# Patient Record
Sex: Male | Born: 1935
Health system: Southern US, Community
[De-identification: ages and names within clinical notes are randomized; demographics above are authoritative.]

## PROBLEM LIST (undated history)

## (undated) DIAGNOSIS — M4850XA Collapsed vertebra, not elsewhere classified, site unspecified, initial encounter for fracture: Secondary | ICD-10-CM

## (undated) DIAGNOSIS — E7800 Pure hypercholesterolemia, unspecified: Secondary | ICD-10-CM

## (undated) DIAGNOSIS — Z860100 Personal history of colon polyps, unspecified: Secondary | ICD-10-CM

## (undated) DIAGNOSIS — M1990 Unspecified osteoarthritis, unspecified site: Secondary | ICD-10-CM

## (undated) DIAGNOSIS — D126 Benign neoplasm of colon, unspecified: Secondary | ICD-10-CM

## (undated) DIAGNOSIS — K649 Unspecified hemorrhoids: Secondary | ICD-10-CM

## (undated) DIAGNOSIS — I10 Essential (primary) hypertension: Secondary | ICD-10-CM

## (undated) DIAGNOSIS — K2270 Barrett's esophagus without dysplasia: Secondary | ICD-10-CM

## (undated) DIAGNOSIS — E119 Type 2 diabetes mellitus without complications: Secondary | ICD-10-CM

## (undated) HISTORY — PX: NO PAST SURGERIES: SHX2092

## (undated) HISTORY — DX: Type 2 diabetes mellitus without complications: E11.9

## (undated) HISTORY — DX: Barrett's esophagus without dysplasia: K22.70

## (undated) HISTORY — DX: Unspecified osteoarthritis, unspecified site: M19.90

## (undated) HISTORY — DX: Essential (primary) hypertension: I10

## (undated) HISTORY — PX: OTHER SURGICAL HISTORY: SHX169

## (undated) HISTORY — DX: Personal history of colonic polyps: Z86.010

## (undated) HISTORY — DX: Collapsed vertebra, not elsewhere classified, site unspecified, initial encounter for fracture: M48.50XA

## (undated) HISTORY — DX: Benign neoplasm of colon, unspecified: D12.6

## (undated) HISTORY — PX: UPPER GASTROINTESTINAL ENDOSCOPY: SHX188

## (undated) HISTORY — DX: Personal history of colon polyps, unspecified: Z86.0100

## (undated) HISTORY — DX: Pure hypercholesterolemia, unspecified: E78.00

## (undated) HISTORY — DX: Unspecified hemorrhoids: K64.9

---
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
## 2008-02-27 ENCOUNTER — Ambulatory Visit: Payer: Self-pay | Admitting: Internal Medicine

## 2008-02-28 ENCOUNTER — Telehealth: Payer: Self-pay | Admitting: Internal Medicine

## 2008-03-12 ENCOUNTER — Ambulatory Visit: Payer: Self-pay | Admitting: Internal Medicine

## 2008-03-12 ENCOUNTER — Encounter: Payer: Self-pay | Admitting: Internal Medicine

## 2008-03-15 ENCOUNTER — Encounter: Payer: Self-pay | Admitting: Internal Medicine

## 2008-05-04 ENCOUNTER — Encounter: Admission: RE | Admit: 2008-05-04 | Discharge: 2008-05-04 | Payer: Self-pay | Admitting: Internal Medicine

## 2008-06-08 DIAGNOSIS — K648 Other hemorrhoids: Secondary | ICD-10-CM | POA: Insufficient documentation

## 2008-06-10 ENCOUNTER — Ambulatory Visit: Payer: Self-pay | Admitting: Internal Medicine

## 2008-06-10 DIAGNOSIS — M159 Polyosteoarthritis, unspecified: Secondary | ICD-10-CM

## 2008-06-10 DIAGNOSIS — Z8711 Personal history of peptic ulcer disease: Secondary | ICD-10-CM

## 2008-06-10 DIAGNOSIS — R1013 Epigastric pain: Secondary | ICD-10-CM

## 2008-06-15 ENCOUNTER — Encounter: Payer: Self-pay | Admitting: Internal Medicine

## 2008-06-15 ENCOUNTER — Ambulatory Visit: Payer: Self-pay | Admitting: Internal Medicine

## 2008-06-17 ENCOUNTER — Encounter: Payer: Self-pay | Admitting: Internal Medicine

## 2011-04-25 LAB — GLUCOSE, CAPILLARY: Glucose-Capillary: 105 — ABNORMAL HIGH

## 2011-08-16 ENCOUNTER — Encounter: Payer: Self-pay | Admitting: Internal Medicine

## 2011-09-22 ENCOUNTER — Ambulatory Visit (AMBULATORY_SURGERY_CENTER): Payer: Medicare Other

## 2011-09-22 VITALS — Ht 66.0 in | Wt 158.0 lb

## 2011-09-22 DIAGNOSIS — K2270 Barrett's esophagus without dysplasia: Secondary | ICD-10-CM

## 2011-10-06 ENCOUNTER — Telehealth: Payer: Self-pay | Admitting: *Deleted

## 2011-10-06 ENCOUNTER — Encounter: Payer: Self-pay | Admitting: Internal Medicine

## 2011-10-06 ENCOUNTER — Ambulatory Visit (AMBULATORY_SURGERY_CENTER): Payer: Medicare Other | Admitting: Internal Medicine

## 2011-10-06 ENCOUNTER — Other Ambulatory Visit: Payer: Self-pay | Admitting: *Deleted

## 2011-10-06 VITALS — BP 124/70 | HR 72 | Temp 98.1°F | Resp 18 | Ht 66.0 in | Wt 158.0 lb

## 2011-10-06 DIAGNOSIS — K2270 Barrett's esophagus without dysplasia: Secondary | ICD-10-CM

## 2011-10-06 DIAGNOSIS — K2960 Other gastritis without bleeding: Secondary | ICD-10-CM

## 2011-10-06 DIAGNOSIS — I714 Abdominal aortic aneurysm, without rupture, unspecified: Secondary | ICD-10-CM

## 2011-10-06 DIAGNOSIS — K2970 Gastritis, unspecified, without bleeding: Secondary | ICD-10-CM | POA: Diagnosis not present

## 2011-10-06 DIAGNOSIS — K2990 Gastroduodenitis, unspecified, without bleeding: Secondary | ICD-10-CM

## 2011-10-06 DIAGNOSIS — F411 Generalized anxiety disorder: Secondary | ICD-10-CM | POA: Diagnosis not present

## 2011-10-06 MED ORDER — SODIUM CHLORIDE 0.9 % IV SOLN: 500.0000 mL | INTRAVENOUS | Status: DC

## 2011-10-06 NOTE — Progress Notes (Signed)
Patient did not experience any of the following events: a burn prior to discharge; a fall within the facility; wrong site/side/patient/procedure/implant event; or a hospital transfer or hospital admission upon discharge from the facility. (G8907) Patient did not have preoperative order for IV antibiotic SSI prophylaxis. (G8918)  

## 2011-10-06 NOTE — Telephone Encounter (Signed)
Per Dr. Juanda Chance patient needs ultrasound of upper abdomen for f/u abdominal aortic aneurysm. Scheduled with Elnita Maxwell at [facility] radiology on 10/11/11 at 9:30 AM. NPO after midnight. Appointment and instructions given to Carney Bern in recovery to give to patient.

## 2011-10-06 NOTE — Patient Instructions (Signed)

## 2011-10-06 NOTE — Op Note (Signed)
[facility] [address] [place], [place]  [address]  ENDOSCOPY PROCEDURE REPORT  PATIENT:  Cameron, Todd  MR#:  [id number] BIRTHDATE:  [birth year], 78 yrs. old  GENDER:  male  ENDOSCOPIST:  Hedwig Morton. Juanda Chance, MD Referred by:  Jarome Matin, M.D.  PROCEDURE DATE:  10/06/2011 PROCEDURE:  EGD with biopsy, 43239 ASA CLASS:  Class II INDICATIONS:  h/o Barrett's Esophagus Gastric ulcer in 1990 Barrett's esophagus on EGD 05/2008  MEDICATIONS:   MAC sedation, administered by CRNA, propofol (Diprivan) 120 mg TOPICAL ANESTHETIC:  none  DESCRIPTION OF PROCEDURE:   After the risks benefits and alternatives of the procedure were thoroughly explained, informed consent was obtained.  The [facility] GIF-H180 [id number] endoscope was introduced through the mouth and advanced to the second portion of the duodenum, without limitations.  The instrument was slowly withdrawn as the mucosa was fully examined. <<PROCEDUREIMAGES>>  irregular Z-line. With standard forceps, a biopsy was obtained and sent to pathology (see image1, image2, and image8).  Mild gastritis was found. few antral erosions With standard forceps, a biopsy was obtained and sent to pathology. r/o H (see image5).Pylori  Otherwise the examination was normal (see image3, image4, image6, and image7).    Retroflexed views revealed no abnormalities.    The scope was then withdrawn from the patient and the procedure completed.  COMPLICATIONS:  None  ENDOSCOPIC IMPRESSION: 1) Irregular Z-line 2) Mild gastritis 3) Otherwise normal examination s/p gastric and g-e junction biopsies RECOMMENDATIONS: 1) Await biopsy results  REPEAT EXAM:  In 3 year(s) for.  ______________________________ Hedwig Morton. Juanda Chance, MD  CC:  n. eSIGNED:   Hedwig Morton. Aaniyah Strohm at 10/06/2011 11:04 AM  Colman Cater, [id number]

## 2011-10-09 ENCOUNTER — Telehealth: Payer: Self-pay | Admitting: *Deleted

## 2011-10-09 NOTE — Telephone Encounter (Signed)
  Follow up Call-  Call back number 10/06/2011  Post procedure Call Back phone  # [phone]  Permission to leave phone message Yes     Patient questions:  Do you have a fever, pain , or abdominal swelling? no Pain Score  0 *  Have you tolerated food without any problems? yes  Have you been able to return to your normal activities? yes  Do you have any questions about your discharge instructions: Diet   no Medications  no Follow up visit  no  Do you have questions or concerns about your Care? no  Actions: * If pain score is 4 or above: No action needed, pain <4.

## 2011-10-10 ENCOUNTER — Encounter: Payer: Self-pay | Admitting: Internal Medicine

## 2011-10-11 ENCOUNTER — Ambulatory Visit (HOSPITAL_COMMUNITY)
Admission: RE | Admit: 2011-10-11 | Discharge: 2011-10-11 | Disposition: A | Discharge status: Home / Self Care | Payer: Medicare Other | Source: Ambulatory Visit | Attending: Internal Medicine | Admitting: Internal Medicine

## 2011-10-11 DIAGNOSIS — I714 Abdominal aortic aneurysm, without rupture: Secondary | ICD-10-CM | POA: Diagnosis not present

## 2011-10-11 DIAGNOSIS — R109 Unspecified abdominal pain: Secondary | ICD-10-CM | POA: Diagnosis not present

## 2011-10-11 DIAGNOSIS — K7689 Other specified diseases of liver: Secondary | ICD-10-CM | POA: Insufficient documentation

## 2011-12-19 DIAGNOSIS — L723 Sebaceous cyst: Secondary | ICD-10-CM | POA: Diagnosis not present

## 2011-12-19 DIAGNOSIS — D235 Other benign neoplasm of skin of trunk: Secondary | ICD-10-CM | POA: Diagnosis not present

## 2011-12-19 DIAGNOSIS — L570 Actinic keratosis: Secondary | ICD-10-CM | POA: Diagnosis not present

## 2012-04-24 DIAGNOSIS — R7309 Other abnormal glucose: Secondary | ICD-10-CM | POA: Diagnosis not present

## 2012-04-24 DIAGNOSIS — E559 Vitamin D deficiency, unspecified: Secondary | ICD-10-CM | POA: Diagnosis not present

## 2012-04-24 DIAGNOSIS — Z23 Encounter for immunization: Secondary | ICD-10-CM | POA: Diagnosis not present

## 2012-04-24 DIAGNOSIS — E785 Hyperlipidemia, unspecified: Secondary | ICD-10-CM | POA: Diagnosis not present

## 2012-04-24 DIAGNOSIS — Z125 Encounter for screening for malignant neoplasm of prostate: Secondary | ICD-10-CM | POA: Diagnosis not present

## 2012-05-01 DIAGNOSIS — Z125 Encounter for screening for malignant neoplasm of prostate: Secondary | ICD-10-CM | POA: Diagnosis not present

## 2012-05-01 DIAGNOSIS — Z0000 Encounter for general adult medical examination without abnormal findings: Secondary | ICD-10-CM | POA: Diagnosis not present

## 2012-05-01 DIAGNOSIS — K2270 Barrett's esophagus without dysplasia: Secondary | ICD-10-CM | POA: Diagnosis not present

## 2012-05-02 DIAGNOSIS — Z1212 Encounter for screening for malignant neoplasm of rectum: Secondary | ICD-10-CM | POA: Diagnosis not present

## 2012-10-12 DIAGNOSIS — S41109A Unspecified open wound of unspecified upper arm, initial encounter: Secondary | ICD-10-CM | POA: Diagnosis not present

## 2012-10-12 DIAGNOSIS — W11XXXA Fall on and from ladder, initial encounter: Secondary | ICD-10-CM | POA: Diagnosis not present

## 2012-10-12 DIAGNOSIS — M545 Low back pain: Secondary | ICD-10-CM | POA: Diagnosis not present

## 2012-10-12 DIAGNOSIS — Z23 Encounter for immunization: Secondary | ICD-10-CM | POA: Diagnosis not present

## 2012-10-12 DIAGNOSIS — S139XXA Sprain of joints and ligaments of unspecified parts of neck, initial encounter: Secondary | ICD-10-CM | POA: Diagnosis not present

## 2012-10-12 DIAGNOSIS — S199XXA Unspecified injury of neck, initial encounter: Secondary | ICD-10-CM | POA: Diagnosis not present

## 2012-10-12 DIAGNOSIS — M542 Cervicalgia: Secondary | ICD-10-CM | POA: Diagnosis not present

## 2012-10-12 DIAGNOSIS — IMO0002 Reserved for concepts with insufficient information to code with codable children: Secondary | ICD-10-CM | POA: Diagnosis not present

## 2012-10-12 DIAGNOSIS — M79609 Pain in unspecified limb: Secondary | ICD-10-CM | POA: Diagnosis not present

## 2012-10-21 DIAGNOSIS — R109 Unspecified abdominal pain: Secondary | ICD-10-CM | POA: Diagnosis not present

## 2012-10-21 DIAGNOSIS — R030 Elevated blood-pressure reading, without diagnosis of hypertension: Secondary | ICD-10-CM | POA: Diagnosis not present

## 2012-10-21 DIAGNOSIS — N400 Enlarged prostate without lower urinary tract symptoms: Secondary | ICD-10-CM | POA: Diagnosis not present

## 2012-10-24 DIAGNOSIS — Z4802 Encounter for removal of sutures: Secondary | ICD-10-CM | POA: Diagnosis not present

## 2012-10-24 DIAGNOSIS — S41109A Unspecified open wound of unspecified upper arm, initial encounter: Secondary | ICD-10-CM | POA: Diagnosis not present

## 2013-02-27 DIAGNOSIS — IMO0002 Reserved for concepts with insufficient information to code with codable children: Secondary | ICD-10-CM | POA: Diagnosis not present

## 2013-02-27 DIAGNOSIS — M549 Dorsalgia, unspecified: Secondary | ICD-10-CM | POA: Diagnosis not present

## 2013-02-28 DIAGNOSIS — M545 Low back pain: Secondary | ICD-10-CM | POA: Diagnosis not present

## 2013-02-28 DIAGNOSIS — S22009A Unspecified fracture of unspecified thoracic vertebra, initial encounter for closed fracture: Secondary | ICD-10-CM | POA: Diagnosis not present

## 2013-03-11 DIAGNOSIS — M47817 Spondylosis without myelopathy or radiculopathy, lumbosacral region: Secondary | ICD-10-CM | POA: Diagnosis not present

## 2013-03-12 DIAGNOSIS — M545 Low back pain: Secondary | ICD-10-CM | POA: Diagnosis not present

## 2013-04-30 DIAGNOSIS — R030 Elevated blood-pressure reading, without diagnosis of hypertension: Secondary | ICD-10-CM | POA: Diagnosis not present

## 2013-04-30 DIAGNOSIS — R7309 Other abnormal glucose: Secondary | ICD-10-CM | POA: Diagnosis not present

## 2013-04-30 DIAGNOSIS — E785 Hyperlipidemia, unspecified: Secondary | ICD-10-CM | POA: Diagnosis not present

## 2013-04-30 DIAGNOSIS — Z125 Encounter for screening for malignant neoplasm of prostate: Secondary | ICD-10-CM | POA: Diagnosis not present

## 2013-05-07 DIAGNOSIS — Z23 Encounter for immunization: Secondary | ICD-10-CM | POA: Diagnosis not present

## 2013-05-07 DIAGNOSIS — Z1331 Encounter for screening for depression: Secondary | ICD-10-CM | POA: Diagnosis not present

## 2013-05-07 DIAGNOSIS — Z0000 Encounter for general adult medical examination without abnormal findings: Secondary | ICD-10-CM | POA: Diagnosis not present

## 2013-05-07 DIAGNOSIS — M549 Dorsalgia, unspecified: Secondary | ICD-10-CM | POA: Diagnosis not present

## 2013-05-07 DIAGNOSIS — S22009A Unspecified fracture of unspecified thoracic vertebra, initial encounter for closed fracture: Secondary | ICD-10-CM | POA: Diagnosis not present

## 2013-05-07 DIAGNOSIS — Z1212 Encounter for screening for malignant neoplasm of rectum: Secondary | ICD-10-CM | POA: Diagnosis not present

## 2013-05-07 DIAGNOSIS — G2581 Restless legs syndrome: Secondary | ICD-10-CM | POA: Diagnosis not present

## 2013-05-07 DIAGNOSIS — K2270 Barrett's esophagus without dysplasia: Secondary | ICD-10-CM | POA: Diagnosis not present

## 2013-05-07 DIAGNOSIS — F411 Generalized anxiety disorder: Secondary | ICD-10-CM | POA: Diagnosis not present

## 2013-05-07 DIAGNOSIS — R7309 Other abnormal glucose: Secondary | ICD-10-CM | POA: Diagnosis not present

## 2013-05-07 DIAGNOSIS — E785 Hyperlipidemia, unspecified: Secondary | ICD-10-CM | POA: Diagnosis not present

## 2013-05-12 DIAGNOSIS — M545 Low back pain: Secondary | ICD-10-CM | POA: Diagnosis not present

## 2013-05-13 ENCOUNTER — Encounter: Payer: Self-pay | Admitting: Physician Assistant

## 2013-05-13 DIAGNOSIS — E559 Vitamin D deficiency, unspecified: Secondary | ICD-10-CM | POA: Diagnosis not present

## 2013-05-13 DIAGNOSIS — T148XXA Other injury of unspecified body region, initial encounter: Secondary | ICD-10-CM | POA: Diagnosis not present

## 2013-05-20 ENCOUNTER — Ambulatory Visit (INDEPENDENT_AMBULATORY_CARE_PROVIDER_SITE_OTHER): Payer: Medicare Other | Admitting: Physician Assistant

## 2013-05-20 ENCOUNTER — Encounter: Payer: Self-pay | Admitting: Physician Assistant

## 2013-05-20 VITALS — BP 120/60 | HR 78 | Ht 63.0 in | Wt 153.0 lb

## 2013-05-20 DIAGNOSIS — K2270 Barrett's esophagus without dysplasia: Secondary | ICD-10-CM | POA: Insufficient documentation

## 2013-05-20 DIAGNOSIS — K921 Melena: Secondary | ICD-10-CM | POA: Diagnosis not present

## 2013-05-20 DIAGNOSIS — I10 Essential (primary) hypertension: Secondary | ICD-10-CM | POA: Diagnosis not present

## 2013-05-20 MED ORDER — MOVIPREP 100 G PO SOLR: 1.0000 | Freq: Once | ORAL | Status: DC

## 2013-05-20 NOTE — Patient Instructions (Signed)
You have been scheduled for a colonoscopy with propofol. Please follow written instructions given to you at your visit today.  Please pick up your prep kit at the pharmacy within the next 1-3 days. [facility]. If you use inhalers (even only as needed), please bring them with you on the day of your procedure.

## 2013-05-20 NOTE — Progress Notes (Signed)
.     Subjective:       Patient ID: Cameron Todd, male    DOB: 04/16/1933, 79 y.o.   MRN: [id number]  HPI Cameron Todd is a pleasant  79 year old male referred today by Cameron Todd for evaluation of Hemoccult-positive stool which was found at the time of recent physical. Patient is known to Dr. Lina Todd with history of Barrett's esophagus. She last had EGD in March of 2013 with finding of mild gastritis and intestinal metaplasia consistent with Barrett's without dysplasia. Last colonoscopy was done in August of 2009 for screening and did show an unspecified colitis involving most of the colon starting at 40 cm with erosions and mild oozing. However biopsies did not show any acute inflammation or evidence for definite colitis. Patient has history of hyperlipidemia BPH and diabetes mellitus. He has no current GI symptoms. Specifically he denies any melena or hematochezia. He says his stools are very regular in he does not have problems with constipation or diarrhea. He has no complaints of abdominal pain or bloating . His appetite is fine and his weight has been stable. He is not on any regular aspirin or NSAIDs and no blood thinners. He has been having some ongoing back pain since a fall in March of 2014.    Review of Systems  Constitutional: Negative.   HENT: Negative.   Eyes: Negative.   Respiratory: Negative.   Cardiovascular: Negative.   Gastrointestinal: Negative.   Endocrine: Negative.   Genitourinary: Negative.   Musculoskeletal: Positive for back pain.  Allergic/Immunologic: Negative.   Neurological: Negative.   Hematological: Negative.   Psychiatric/Behavioral: Negative.    Outpatient Prescriptions Prior to Visit  Medication Sig Dispense Refill  . AVODART 0.5 MG capsule       . CRESTOR 10 MG tablet       . omeprazole (PRILOSEC) 20 MG capsule       . pioglitazone (ACTOS) 30 MG tablet        No facility-administered medications prior to visit.      No Known  Allergies Patient Active Problem List   Diagnosis Date Noted  . GENERALIZED OSTEOARTHROSIS UNSPECIFIED SITE 06/10/2008  . EPIGASTRIC PAIN 06/10/2008  . GASTRIC ULCER, HX OF 06/10/2008  . INTERNAL HEMORRHOIDS 06/08/2008   family history includes Diabetes in his sister. There is no history of Colon cancer. History  Substance Use Topics  . Smoking status: Never Smoker   . Smokeless tobacco: Never Used  . Alcohol Use: No    Objective:   Physical Exam well-developed older Asian male in no acute distress, accompanied by his wife blood pressure 120/60 pulse 78 height 5 foot 3 weight 153. HEENT nontraumatic normocephalic EOMI PERRLA sclera anicteric, Supple no JVD, Cardiovascular regular rate and rhythm with S1-S2 , soft systolic murmur, Pulmonary clear bilaterally, Abdomen soft nontender nondistended bowel sounds are active no palpable mass or hepatosplenomegaly, Rectal exam not done he was recently documented Hemoccult positive, Extremities no clubbing cyanosis or edema skin warm and dry, Psych mood and affect appropriate        Assessment & Plan:  #25  79 year old male with Hemoccult positive stool, otherwise asymptomatic. Will need to rule out occult colonic lesion. Last colonoscopy in August of 2009 showed a mild unspecified colitis however biopsies were unrevealing. Also need to consider a very low-grade underlying colitis/IBD #2 Barrett's esophagus no history of dysplasia last EGD March 2013 #3 hyperlipidemia #4 hypertension #5 diabetes mellitus  Plan; Patient is scheduled for colonoscopy with  Dr. Chauncy Todd was discussed in detail with the patient and his wife and they are agreeable to proceed.

## 2013-05-20 NOTE — Progress Notes (Signed)
reviewed and agree. 

## 2013-05-21 DIAGNOSIS — M545 Low back pain: Secondary | ICD-10-CM | POA: Diagnosis not present

## 2013-05-28 DIAGNOSIS — M545 Low back pain: Secondary | ICD-10-CM | POA: Diagnosis not present

## 2013-06-04 ENCOUNTER — Encounter: Payer: Medicare Other | Admitting: Internal Medicine

## 2013-06-04 ENCOUNTER — Encounter: Payer: Self-pay | Admitting: Internal Medicine

## 2013-06-04 ENCOUNTER — Ambulatory Visit (AMBULATORY_SURGERY_CENTER): Payer: Medicare Other | Admitting: Internal Medicine

## 2013-06-04 VITALS — BP 119/64 | HR 71 | Temp 97.5°F | Resp 20 | Ht 63.0 in | Wt 153.0 lb

## 2013-06-04 DIAGNOSIS — R1013 Epigastric pain: Secondary | ICD-10-CM | POA: Diagnosis not present

## 2013-06-04 DIAGNOSIS — K648 Other hemorrhoids: Secondary | ICD-10-CM | POA: Diagnosis not present

## 2013-06-04 DIAGNOSIS — K2270 Barrett's esophagus without dysplasia: Secondary | ICD-10-CM | POA: Diagnosis not present

## 2013-06-04 DIAGNOSIS — K921 Melena: Secondary | ICD-10-CM

## 2013-06-04 DIAGNOSIS — I10 Essential (primary) hypertension: Secondary | ICD-10-CM | POA: Diagnosis not present

## 2013-06-04 DIAGNOSIS — D126 Benign neoplasm of colon, unspecified: Secondary | ICD-10-CM

## 2013-06-04 DIAGNOSIS — R195 Other fecal abnormalities: Secondary | ICD-10-CM | POA: Diagnosis not present

## 2013-06-04 MED ORDER — SODIUM CHLORIDE 0.9 % IV SOLN: 500.0000 mL | INTRAVENOUS | Status: DC

## 2013-06-04 MED ORDER — HYDROCORTISONE ACETATE 25 MG RE SUPP: 25.0000 mg | Freq: Every evening | RECTAL | Status: DC | PRN

## 2013-06-04 MED ORDER — OMEPRAZOLE 40 MG PO CPDR: 40.0000 mg | DELAYED_RELEASE_CAPSULE | Freq: Two times a day (BID) | ORAL | Status: DC

## 2013-06-04 NOTE — Op Note (Signed)
[facility] [address] [place], [address]   COLONOSCOPY PROCEDURE REPORT  PATIENT: Cameron Todd, Cameron Todd  MR#: [id number] BIRTHDATE: 1933/02/01 , 80  yrs. old GENDER: Male ENDOSCOPIST: Hart Carwin, MD REFERRED XB:JYNWGN Eloise Harman, M.D. PROCEDURE DATE:  06/04/2013 PROCEDURE:   Colonoscopy with cold biopsy polypectomy First Screening Colonoscopy - Avg.  risk and is 50 yrs.  old or older - No.  Prior Negative Screening - Now for repeat screening. N/A  History of Adenoma - Now for follow-up colonoscopy & has been > or = to 3 yrs.  N/A  Polyps Removed Today? Yes. ASA CLASS:   Class II INDICATIONS:, heme positive stool on test prior colonoscopy August 2009 showed nonspecific colitis. The biopsies showed normal mucosa. He has history of hemorrhoids MEDICATIONS: MAC sedation, administered by CRNA and propofol (Diprivan) 300mg  IV  DESCRIPTION OF PROCEDURE:   After the risks benefits and alternatives of the procedure were thoroughly explained, informed consent was obtained.  A digital rectal exam revealed no abnormalities of the rectum.   The LB PFC-H190 [id number]  endoscope was introduced through the anus and advanced to the cecum, which was identified by both the appendix and ileocecal valve. No adverse events experienced.   The quality of the prep was Prepopik excellent  The instrument was then slowly withdrawn as the colon was fully examined.      COLON FINDINGS: Two diminutive smooth flat polyps were found in the descending colon.  A polypectomy was performed with cold forceps. The resection was complete and the polyp tissue was completely retrieved.   Moderate sized hemorrhoids were found.  Retroflexed views revealed no abnormalities. The time to cecum=7 minutes 46 seconds.  Withdrawal time=11 minutes 26 seconds.  The scope was withdrawn and the procedure completed. COMPLICATIONS: There were no complications.  ENDOSCOPIC IMPRESSION: 1.   Two diminutive flat  polyps were found in the descending colon; polypectomy was performed with cold forceps 2.   Moderate sized hemorrhoids , likely source of bleeding, the hemorrhoids are large and may have to be banded eventually  RECOMMENDATIONS: 1.  Await pathology results 2.  High fiber diet 3.   Anusol HC supp hs   eSigned:  Hart Carwin, MD 06/04/2013 10:48 AM   cc:   PATIENT NAME:  Cameron Todd, Cameron Todd MR#: [id number]

## 2013-06-04 NOTE — Progress Notes (Signed)
Called to room to assist during endoscopic procedure.  Patient ID and intended procedure confirmed with present staff. Received instructions for my participation in the procedure from the performing physician.  

## 2013-06-04 NOTE — Progress Notes (Signed)
Patient did not experience any of the following events: a burn prior to discharge; a fall within the facility; wrong site/side/patient/procedure/implant event; or a hospital transfer or hospital admission upon discharge from the facility. (G8907) Patient did not have preoperative order for IV antibiotic SSI prophylaxis. (G8918)  

## 2013-06-04 NOTE — Progress Notes (Signed)
Reportr to pacu rn, vss, bbs=clear 

## 2013-06-04 NOTE — Patient Instructions (Signed)
YOU HAD AN ENDOSCOPIC PROCEDURE TODAY AT THE [facility]: Refer to the procedure report that was given to you for any specific questions about what was found during the examination.  If the procedure report does not answer your questions, please call your gastroenterologist to clarify.  If you requested that your care partner not be given the details of your procedure findings, then the procedure report has been included in a sealed envelope for you to review at your convenience later.  YOU SHOULD EXPECT: Some feelings of bloating in the abdomen. Passage of more gas than usual.  Walking can help get rid of the air that was put into your GI tract during the procedure and reduce the bloating. If you had a lower endoscopy (such as a colonoscopy or flexible sigmoidoscopy) you may notice spotting of blood in your stool or on the toilet paper. If you underwent a bowel prep for your procedure, then you may not have a normal bowel movement for a few days.  DIET: Your first meal following the procedure should be a light meal and then it is ok to progress to your normal diet.  A half-sandwich or bowl of soup is an example of a good first meal.  Heavy or fried foods are harder to digest and may make you feel nauseous or bloated.  Likewise meals heavy in dairy and vegetables can cause extra gas to form and this can also increase the bloating.  Drink plenty of fluids but you should avoid alcoholic beverages for 24 hours.  ACTIVITY: Your care partner should take you home directly after the procedure.  You should plan to take it easy, moving slowly for the rest of the day.  You can resume normal activity the day after the procedure however you should NOT DRIVE or use heavy machinery for 24 hours (because of the sedation medicines used during the test).    SYMPTOMS TO REPORT IMMEDIATELY: A gastroenterologist can be reached at any hour.  During normal business hours, 8:30 AM to 5:00 PM Monday through Friday,  call [phone].  After hours and on weekends, please call the GI answering service at [phone] who will take a message and have the physician on call contact you.   Following lower endoscopy (colonoscopy or flexible sigmoidoscopy):  Excessive amounts of blood in the stool  Significant tenderness or worsening of abdominal pains  Swelling of the abdomen that is new, acute  Fever of 100F or higher  FOLLOW UP: If any biopsies were taken you will be contacted by phone or by letter within the next 1-3 weeks.  Call your gastroenterologist if you have not heard about the biopsies in 3 weeks.  Our staff will call the home number listed on your records the next business day following your procedure to check on you and address any questions or concerns that you may have at that time regarding the information given to you following your procedure. This is a courtesy call and so if there is no answer at the home number and we have not heard from you through the emergency physician on call, we will assume that you have returned to your regular daily activities without incident.  SIGNATURES/CONFIDENTIALITY: You and/or your care partner have signed paperwork which will be entered into your electronic medical record.  These signatures attest to the fact that that the information above on your After Visit Summary has been reviewed and is understood.  Full responsibility of the confidentiality of this   discharge information lies with you and/or your care-partner.  Polyps, hemorrhoids, high fiber diet-handouts given  Repeat colonoscopy will be determined by pathology.   

## 2013-06-05 ENCOUNTER — Telehealth: Payer: Self-pay

## 2013-06-05 NOTE — Telephone Encounter (Signed)
  Follow up Call-  Call back number 06/04/2013 10/06/2011  Post procedure Call Back phone  # [phone]  Permission to leave phone message Yes Yes     Patient questions:  Do you have a fever, pain , or abdominal swelling? no Pain Score  0 *  Have you tolerated food without any problems? yes  Have you been able to return to your normal activities? no  Do you have any questions about your discharge instructions: Diet   no Medications  no Follow up visit  no  Do you have questions or concerns about your Care? no  Actions: * If pain score is 4 or above: No action needed, pain <4.

## 2013-06-09 ENCOUNTER — Encounter: Payer: Self-pay | Admitting: Internal Medicine

## 2013-06-10 ENCOUNTER — Encounter: Payer: Medicare Other | Admitting: Internal Medicine

## 2013-08-28 ENCOUNTER — Encounter: Payer: Self-pay | Admitting: Internal Medicine

## 2014-05-04 DIAGNOSIS — H524 Presbyopia: Secondary | ICD-10-CM | POA: Diagnosis not present

## 2014-05-04 DIAGNOSIS — Z961 Presence of intraocular lens: Secondary | ICD-10-CM | POA: Diagnosis not present

## 2014-05-04 DIAGNOSIS — E119 Type 2 diabetes mellitus without complications: Secondary | ICD-10-CM | POA: Diagnosis not present

## 2014-05-14 DIAGNOSIS — R7301 Impaired fasting glucose: Secondary | ICD-10-CM | POA: Diagnosis not present

## 2014-05-14 DIAGNOSIS — E559 Vitamin D deficiency, unspecified: Secondary | ICD-10-CM | POA: Diagnosis not present

## 2014-05-14 DIAGNOSIS — E785 Hyperlipidemia, unspecified: Secondary | ICD-10-CM | POA: Diagnosis not present

## 2014-05-14 DIAGNOSIS — Z125 Encounter for screening for malignant neoplasm of prostate: Secondary | ICD-10-CM | POA: Diagnosis not present

## 2014-05-21 DIAGNOSIS — G2581 Restless legs syndrome: Secondary | ICD-10-CM | POA: Diagnosis not present

## 2014-05-21 DIAGNOSIS — N183 Chronic kidney disease, stage 3 (moderate): Secondary | ICD-10-CM | POA: Diagnosis not present

## 2014-05-21 DIAGNOSIS — Z1389 Encounter for screening for other disorder: Secondary | ICD-10-CM | POA: Diagnosis not present

## 2014-05-21 DIAGNOSIS — M810 Age-related osteoporosis without current pathological fracture: Secondary | ICD-10-CM | POA: Diagnosis not present

## 2014-05-21 DIAGNOSIS — R7301 Impaired fasting glucose: Secondary | ICD-10-CM | POA: Diagnosis not present

## 2014-05-21 DIAGNOSIS — M8008XA Age-related osteoporosis with current pathological fracture, vertebra(e), initial encounter for fracture: Secondary | ICD-10-CM | POA: Diagnosis not present

## 2014-05-21 DIAGNOSIS — Z0000 Encounter for general adult medical examination without abnormal findings: Secondary | ICD-10-CM | POA: Diagnosis not present

## 2014-05-21 DIAGNOSIS — M47819 Spondylosis without myelopathy or radiculopathy, site unspecified: Secondary | ICD-10-CM | POA: Diagnosis not present

## 2014-05-21 DIAGNOSIS — M519 Unspecified thoracic, thoracolumbar and lumbosacral intervertebral disc disorder: Secondary | ICD-10-CM | POA: Diagnosis not present

## 2014-05-21 DIAGNOSIS — E559 Vitamin D deficiency, unspecified: Secondary | ICD-10-CM | POA: Diagnosis not present

## 2014-05-21 DIAGNOSIS — K2270 Barrett's esophagus without dysplasia: Secondary | ICD-10-CM | POA: Diagnosis not present

## 2014-05-21 DIAGNOSIS — Z23 Encounter for immunization: Secondary | ICD-10-CM | POA: Diagnosis not present

## 2014-05-21 DIAGNOSIS — E785 Hyperlipidemia, unspecified: Secondary | ICD-10-CM | POA: Diagnosis not present

## 2014-06-02 DIAGNOSIS — Z1212 Encounter for screening for malignant neoplasm of rectum: Secondary | ICD-10-CM | POA: Diagnosis not present

## 2014-07-09 DIAGNOSIS — Z6826 Body mass index (BMI) 26.0-26.9, adult: Secondary | ICD-10-CM | POA: Diagnosis not present

## 2014-07-09 DIAGNOSIS — M810 Age-related osteoporosis without current pathological fracture: Secondary | ICD-10-CM | POA: Diagnosis not present

## 2014-07-09 DIAGNOSIS — N183 Chronic kidney disease, stage 3 (moderate): Secondary | ICD-10-CM | POA: Diagnosis not present

## 2014-08-22 ENCOUNTER — Encounter: Payer: Self-pay | Admitting: Internal Medicine

## 2014-08-28 ENCOUNTER — Encounter: Payer: Self-pay | Admitting: Internal Medicine

## 2014-11-04 ENCOUNTER — Ambulatory Visit (AMBULATORY_SURGERY_CENTER): Payer: Self-pay | Admitting: *Deleted

## 2014-11-04 VITALS — Ht 63.0 in | Wt 154.0 lb

## 2014-11-04 DIAGNOSIS — K2270 Barrett's esophagus without dysplasia: Secondary | ICD-10-CM

## 2014-11-04 NOTE — Progress Notes (Signed)
Patient denies any allergies to eggs or soy. Patient denies any problems with sedation. Patient denies any oxygen use at home and does not take any diet/weight loss medications. Pt declined EMMI information.

## 2014-11-18 ENCOUNTER — Encounter: Payer: Self-pay | Admitting: Internal Medicine

## 2014-11-18 ENCOUNTER — Ambulatory Visit (AMBULATORY_SURGERY_CENTER): Payer: Medicare Other | Admitting: Internal Medicine

## 2014-11-18 VITALS — BP 104/53 | HR 73 | Temp 97.4°F | Resp 27 | Ht 63.0 in | Wt 154.0 lb

## 2014-11-18 DIAGNOSIS — E119 Type 2 diabetes mellitus without complications: Secondary | ICD-10-CM | POA: Diagnosis not present

## 2014-11-18 DIAGNOSIS — I10 Essential (primary) hypertension: Secondary | ICD-10-CM | POA: Diagnosis not present

## 2014-11-18 DIAGNOSIS — K2270 Barrett's esophagus without dysplasia: Secondary | ICD-10-CM | POA: Diagnosis not present

## 2014-11-18 MED ORDER — SODIUM CHLORIDE 0.9 % IV SOLN: 500.0000 mL | INTRAVENOUS | Status: DC

## 2014-11-18 NOTE — Op Note (Signed)
[facility]  [address] [place], [address]   ENDOSCOPY PROCEDURE REPORT  PATIENT: Cameron, Todd  MR#: [id number] BIRTHDATE: October 29, 1932 , 63  yrs. old GENDER: male ENDOSCOPIST: Lafayette Dragon, MD REFERRED BY:  Leanna Battles, M.D. PROCEDURE DATE:  11/18/2014 PROCEDURE:  EGD w/ biopsy ASA CLASS:     Class II INDICATIONS:  Cameron Todd esophagus in 1990, November 2009 and in March 2013.  Patient remains asymptomatic on PPI. MEDICATIONS: Monitored anesthesia care and Propofol 150 mg IV TOPICAL ANESTHETIC: none  DESCRIPTION OF PROCEDURE: After the risks benefits and alternatives of the procedure were thoroughly explained, informed consent was obtained.  The LB [id number] endoscope was introduced through the mouth and advanced to the second portion of the duodenum , Without limitations.  The instrument was slowly withdrawn as the mucosa was fully examined.    Esophagus: proximal mid and distal esophageal mucosa was normal. Squamocolumnar junction was irregular and there were no signs of esophagitis or stricture. There was no hiatal hernia  Stomach: gastric folds appeared normal. There were a few pinpoint erosions in gastric antrum. Pyloric outlet was normal. Retroflexion of the endoscope revealed normal fundus and cardia  Duopdenum: duodenal bulb and descending duodenum was normal[ The scope was then withdrawn from the patient and the procedure completed.  COMPLICATIONS: There were no immediate complications.  ENDOSCOPIC IMPRESSION: 1.history of Barrett's esophagus. Irregular GE junction. Multiple biopsies taken 2. Minimal antral gastritis  RECOMMENDATIONS: 1.  Await pathology results 2.  Continue PPI  REPEAT EXAM: for EGD pending biopsy results.  eSigned:  Lafayette Dragon, MD 11/18/2014 9:31 AM    CC:  PATIENT NAME:  Cameron, Todd MR#: [id number]

## 2014-11-18 NOTE — Progress Notes (Signed)
Called to room to assist during endoscopic procedure.  Patient ID and intended procedure confirmed with present staff. Received instructions for my participation in the procedure from the performing physician.  

## 2014-11-18 NOTE — Progress Notes (Signed)
  [facility] Post-op Note  Patient: Cameron Todd  Procedure(s) Performed: endoscopy  Patient Location: [facility] - Recovery Area  Anesthesia Type: Deep Sedation/Propofol  Level of Consciousness: awake, oriented and patient cooperative  Airway and Oxygen Therapy: Patient Spontanous Breathing  Post-op Pain: none  Post-op Assessment:  Post-op Vital signs reviewed, Patient's Cardiovascular Status Stable, Respiratory Function Stable, Patent Airway, No signs of Nausea or vomiting and Pain level controlled  Post-op Vital Signs: Reviewed and stable  Complications: No apparent anesthesia complications  Irva Loser E 9:31 AM

## 2014-11-18 NOTE — Patient Instructions (Signed)
Biopsies taken today. Gastritis seen, handout given.  Call us with any questions or concerns. Thank you!  YOU HAD AN ENDOSCOPIC PROCEDURE TODAY AT [facility]:   Refer to the procedure report that was given to you for any specific questions about what was found during the examination.  If the procedure report does not answer your questions, please call your gastroenterologist to clarify.  If you requested that your care partner not be given the details of your procedure findings, then the procedure report has been included in a sealed envelope for you to review at your convenience later.  YOU SHOULD EXPECT: Some feelings of bloating in the abdomen. Passage of more gas than usual.  Walking can help get rid of the air that was put into your GI tract during the procedure and reduce the bloating. If you had a lower endoscopy (such as a colonoscopy or flexible sigmoidoscopy) you may notice spotting of blood in your stool or on the toilet paper. If you underwent a bowel prep for your procedure, you may not have a normal bowel movement for a few days.  Please Note:  You might notice some irritation and congestion in your nose or some drainage.  This is from the oxygen used during your procedure.  There is no need for concern and it should clear up in a day or so.  SYMPTOMS TO REPORT IMMEDIATELY:    Following upper endoscopy (EGD)  Vomiting of blood or coffee ground material  New chest pain or pain under the shoulder blades  Painful or persistently difficult swallowing  New shortness of breath  Fever of 100F or higher  Black, tarry-looking stools  For urgent or emergent issues, a gastroenterologist can be reached at any hour by calling [phone].   DIET: Your first meal following the procedure should be a small meal and then it is ok to progress to your normal diet. Heavy or fried foods are harder to digest and may make you feel nauseous or bloated.  Likewise, meals heavy in  dairy and vegetables can increase bloating.  Drink plenty of fluids but you should avoid alcoholic beverages for 24 hours.  ACTIVITY:  You should plan to take it easy for the rest of today and you should NOT DRIVE or use heavy machinery until tomorrow (because of the sedation medicines used during the test).    FOLLOW UP: Our staff will call the number listed on your records the next business day following your procedure to check on you and address any questions or concerns that you may have regarding the information given to you following your procedure. If we do not reach you, we will leave a message.  However, if you are feeling well and you are not experiencing any problems, there is no need to return our call.  We will assume that you have returned to your regular daily activities without incident.  If any biopsies were taken you will be contacted by phone or by letter within the next 1-3 weeks.  Please call us at [phone] if you have not heard about the biopsies in 3 weeks.    SIGNATURES/CONFIDENTIALITY: You and/or your care partner have signed paperwork which will be entered into your electronic medical record.  These signatures attest to the fact that that the information above on your After Visit Summary has been reviewed and is understood.  Full responsibility of the confidentiality of this discharge information lies with you and/or your care-partner.

## 2014-11-19 ENCOUNTER — Telehealth: Payer: Self-pay | Admitting: *Deleted

## 2014-11-19 NOTE — Telephone Encounter (Signed)
  Follow up Call-  Call back number 11/18/2014 06/04/2013  Post procedure Call Back phone  # [phone]  Permission to leave phone message Yes Yes     Patient questions:  Do you have a fever, pain , or abdominal swelling? No. Pain Score  0 *  Have you tolerated food without any problems? Yes.    Have you been able to return to your normal activities? Yes.    Do you have any questions about your discharge instructions: Diet   No. Medications  No. Follow up visit  No.  Do you have questions or concerns about your Care? No.  Actions: * If pain score is 4 or above: No action needed, pain <4.

## 2014-11-19 NOTE — Telephone Encounter (Signed)
  Follow up Call-  Call back number 11/18/2014 06/04/2013  Post procedure Call Back phone  # [phone]  Permission to leave phone message Yes Yes     Patient questions:  Do you have a fever, pain , or abdominal swelling? No. Pain Score  0 *  Have you tolerated food without any problems? Yes.    Have you been able to return to your normal activities? Yes.    Do you have any questions about your discharge instructions: Diet   No. Medications  No. Follow up visit  No.  Do you have questions or concerns about your Care? No.  Actions: * If pain score is 4 or above: No action needed, pain <4.

## 2014-11-23 ENCOUNTER — Encounter: Payer: Self-pay | Admitting: Internal Medicine

## 2015-01-05 DIAGNOSIS — M810 Age-related osteoporosis without current pathological fracture: Secondary | ICD-10-CM | POA: Diagnosis not present

## 2015-01-05 DIAGNOSIS — Z6827 Body mass index (BMI) 27.0-27.9, adult: Secondary | ICD-10-CM | POA: Diagnosis not present

## 2015-01-11 DIAGNOSIS — M546 Pain in thoracic spine: Secondary | ICD-10-CM | POA: Diagnosis not present

## 2015-02-15 ENCOUNTER — Encounter (HOSPITAL_COMMUNITY): Payer: Self-pay

## 2015-02-15 ENCOUNTER — Ambulatory Visit (HOSPITAL_COMMUNITY)
Admission: RE | Admit: 2015-02-15 | Discharge: 2015-02-15 | Disposition: A | Discharge status: Home / Self Care | Payer: Medicare Other | Source: Ambulatory Visit | Attending: Internal Medicine | Admitting: Internal Medicine

## 2015-02-15 DIAGNOSIS — M810 Age-related osteoporosis without current pathological fracture: Secondary | ICD-10-CM | POA: Diagnosis not present

## 2015-02-15 MED ORDER — DENOSUMAB 60 MG/ML ~~LOC~~ SOLN
60.0000 mg | Freq: Once | SUBCUTANEOUS | Status: AC
  Administered 2015-02-15: 60 mg via SUBCUTANEOUS
  Filled 2015-02-15: qty 1

## 2015-02-15 NOTE — Discharge Instructions (Signed)
Denosumab injection What is this medicine? DENOSUMAB (den oh sue mab) slows bone breakdown. Prolia is used to treat osteoporosis in women after menopause and in men. Xgeva is used to prevent bone fractures and other bone problems caused by cancer bone metastases. Xgeva is also used to treat giant cell tumor of the bone. This medicine may be used for other purposes; ask your health care provider or pharmacist if you have questions. COMMON BRAND NAME(S): Prolia, XGEVA What should I tell my health care provider before I take this medicine? They need to know if you have any of these conditions: -dental disease -eczema -infection or history of infections -kidney disease or on dialysis -low blood calcium or vitamin D -malabsorption syndrome -scheduled to have surgery or tooth extraction -taking medicine that contains denosumab -thyroid or parathyroid disease -an unusual reaction to denosumab, other medicines, foods, dyes, or preservatives -pregnant or trying to get pregnant -breast-feeding How should I use this medicine? This medicine is for injection under the skin. It is given by a health care professional in a hospital or clinic setting. If you are getting Prolia, a special MedGuide will be given to you by the pharmacist with each prescription and refill. Be sure to read this information carefully each time. For Prolia, talk to your pediatrician regarding the use of this medicine in children. Special care may be needed. For Xgeva, talk to your pediatrician regarding the use of this medicine in children. While this drug may be prescribed for children as young as 13 years for selected conditions, precautions do apply. Overdosage: If you think you've taken too much of this medicine contact a poison control center or emergency room at once. Overdosage: If you think you have taken too much of this medicine contact a poison control center or emergency room at once. NOTE: This medicine is only for  you. Do not share this medicine with others. What if I miss a dose? It is important not to miss your dose. Call your doctor or health care professional if you are unable to keep an appointment. What may interact with this medicine? Do not take this medicine with any of the following medications: -other medicines containing denosumab This medicine may also interact with the following medications: -medicines that suppress the immune system -medicines that treat cancer -steroid medicines like prednisone or cortisone This list may not describe all possible interactions. Give your health care provider a list of all the medicines, herbs, non-prescription drugs, or dietary supplements you use. Also tell them if you smoke, drink alcohol, or use illegal drugs. Some items may interact with your medicine. What should I watch for while using this medicine? Visit your doctor or health care professional for regular checks on your progress. Your doctor or health care professional may order blood tests and other tests to see how you are doing. Call your doctor or health care professional if you get a cold or other infection while receiving this medicine. Do not treat yourself. This medicine may decrease your body's ability to fight infection. You should make sure you get enough calcium and vitamin D while you are taking this medicine, unless your doctor tells you not to. Discuss the foods you eat and the vitamins you take with your health care professional. See your dentist regularly. Brush and floss your teeth as directed. Before you have any dental work done, tell your dentist you are receiving this medicine. Do not become pregnant while taking this medicine or for 5 months after stopping   it. Women should inform their doctor if they wish to become pregnant or think they might be pregnant. There is a potential for serious side effects to an unborn child. Talk to your health care professional or pharmacist for more  information. What side effects may I notice from receiving this medicine? Side effects that you should report to your doctor or health care professional as soon as possible: -allergic reactions like skin rash, itching or hives, swelling of the face, lips, or tongue -breathing problems -chest pain -fast, irregular heartbeat -feeling faint or lightheaded, falls -fever, chills, or any other sign of infection -muscle spasms, tightening, or twitches -numbness or tingling -skin blisters or bumps, or is dry, peels, or red -slow healing or unexplained pain in the mouth or jaw -unusual bleeding or bruising Side effects that usually do not require medical attention (Report these to your doctor or health care professional if they continue or are bothersome.): -muscle pain -stomach upset, gas This list may not describe all possible side effects. Call your doctor for medical advice about side effects. You may report side effects to FDA at 1-800-FDA-1088. Where should I keep my medicine? This medicine is only given in a clinic, doctor's office, or other health care setting and will not be stored at home. NOTE: This sheet is a summary. It may not cover all possible information. If you have questions about this medicine, talk to your doctor, pharmacist, or health care provider.  2015, Elsevier/Gold Standard. (2012-01-08 12:37:47)  

## 2015-03-18 ENCOUNTER — Telehealth: Payer: Self-pay | Admitting: *Deleted

## 2015-03-18 NOTE — Telephone Encounter (Signed)
Patient would like to change care to you. Please advise

## 2015-03-21 NOTE — Telephone Encounter (Signed)
ok 

## 2015-04-12 ENCOUNTER — Encounter: Payer: Self-pay | Admitting: Internal Medicine

## 2015-05-21 DIAGNOSIS — Z125 Encounter for screening for malignant neoplasm of prostate: Secondary | ICD-10-CM | POA: Diagnosis not present

## 2015-05-21 DIAGNOSIS — M810 Age-related osteoporosis without current pathological fracture: Secondary | ICD-10-CM | POA: Diagnosis not present

## 2015-05-21 DIAGNOSIS — R7301 Impaired fasting glucose: Secondary | ICD-10-CM | POA: Diagnosis not present

## 2015-05-21 DIAGNOSIS — E785 Hyperlipidemia, unspecified: Secondary | ICD-10-CM | POA: Diagnosis not present

## 2015-05-21 DIAGNOSIS — N183 Chronic kidney disease, stage 3 (moderate): Secondary | ICD-10-CM | POA: Diagnosis not present

## 2015-05-28 DIAGNOSIS — G629 Polyneuropathy, unspecified: Secondary | ICD-10-CM | POA: Diagnosis not present

## 2015-05-28 DIAGNOSIS — Z0000 Encounter for general adult medical examination without abnormal findings: Secondary | ICD-10-CM | POA: Diagnosis not present

## 2015-05-28 DIAGNOSIS — R7301 Impaired fasting glucose: Secondary | ICD-10-CM | POA: Diagnosis not present

## 2015-05-28 DIAGNOSIS — E784 Other hyperlipidemia: Secondary | ICD-10-CM | POA: Diagnosis not present

## 2015-05-28 DIAGNOSIS — Z6828 Body mass index (BMI) 28.0-28.9, adult: Secondary | ICD-10-CM | POA: Diagnosis not present

## 2015-05-28 DIAGNOSIS — G2581 Restless legs syndrome: Secondary | ICD-10-CM | POA: Diagnosis not present

## 2015-05-28 DIAGNOSIS — E559 Vitamin D deficiency, unspecified: Secondary | ICD-10-CM | POA: Diagnosis not present

## 2015-05-28 DIAGNOSIS — M810 Age-related osteoporosis without current pathological fracture: Secondary | ICD-10-CM | POA: Diagnosis not present

## 2015-05-28 DIAGNOSIS — Z1389 Encounter for screening for other disorder: Secondary | ICD-10-CM | POA: Diagnosis not present

## 2015-05-28 DIAGNOSIS — N183 Chronic kidney disease, stage 3 (moderate): Secondary | ICD-10-CM | POA: Diagnosis not present

## 2015-05-28 DIAGNOSIS — N3281 Overactive bladder: Secondary | ICD-10-CM | POA: Diagnosis not present

## 2015-05-28 DIAGNOSIS — K2270 Barrett's esophagus without dysplasia: Secondary | ICD-10-CM | POA: Diagnosis not present

## 2015-06-07 DIAGNOSIS — M810 Age-related osteoporosis without current pathological fracture: Secondary | ICD-10-CM | POA: Diagnosis not present

## 2015-06-07 DIAGNOSIS — N183 Chronic kidney disease, stage 3 (moderate): Secondary | ICD-10-CM | POA: Diagnosis not present

## 2015-07-06 DIAGNOSIS — N183 Chronic kidney disease, stage 3 (moderate): Secondary | ICD-10-CM | POA: Diagnosis not present

## 2015-07-06 DIAGNOSIS — Z6828 Body mass index (BMI) 28.0-28.9, adult: Secondary | ICD-10-CM | POA: Diagnosis not present

## 2015-07-06 DIAGNOSIS — M810 Age-related osteoporosis without current pathological fracture: Secondary | ICD-10-CM | POA: Diagnosis not present

## 2015-07-06 DIAGNOSIS — M549 Dorsalgia, unspecified: Secondary | ICD-10-CM | POA: Diagnosis not present

## 2015-07-23 ENCOUNTER — Ambulatory Visit: Payer: Medicare Other | Attending: Internal Medicine | Admitting: Physical Therapy

## 2015-08-02 ENCOUNTER — Ambulatory Visit: Payer: Medicare Other | Admitting: Physical Therapy

## 2015-08-10 ENCOUNTER — Ambulatory Visit: Payer: Medicare Other | Attending: Internal Medicine | Admitting: Physical Therapy

## 2015-08-16 ENCOUNTER — Ambulatory Visit (HOSPITAL_COMMUNITY)
Admission: RE | Admit: 2015-08-16 | Discharge: 2015-08-16 | Disposition: A | Discharge status: Home / Self Care | Payer: Medicare Other | Source: Ambulatory Visit | Attending: Internal Medicine | Admitting: Internal Medicine

## 2016-05-25 DIAGNOSIS — Z125 Encounter for screening for malignant neoplasm of prostate: Secondary | ICD-10-CM | POA: Diagnosis not present

## 2016-05-25 DIAGNOSIS — E784 Other hyperlipidemia: Secondary | ICD-10-CM | POA: Diagnosis not present

## 2016-05-25 DIAGNOSIS — N3281 Overactive bladder: Secondary | ICD-10-CM | POA: Diagnosis not present

## 2016-05-25 DIAGNOSIS — R7301 Impaired fasting glucose: Secondary | ICD-10-CM | POA: Diagnosis not present

## 2016-05-25 DIAGNOSIS — M810 Age-related osteoporosis without current pathological fracture: Secondary | ICD-10-CM | POA: Diagnosis not present

## 2016-06-01 DIAGNOSIS — R7301 Impaired fasting glucose: Secondary | ICD-10-CM | POA: Diagnosis not present

## 2016-06-01 DIAGNOSIS — N3281 Overactive bladder: Secondary | ICD-10-CM | POA: Diagnosis not present

## 2016-06-01 DIAGNOSIS — H532 Diplopia: Secondary | ICD-10-CM | POA: Diagnosis not present

## 2016-06-01 DIAGNOSIS — E784 Other hyperlipidemia: Secondary | ICD-10-CM | POA: Diagnosis not present

## 2016-06-01 DIAGNOSIS — M549 Dorsalgia, unspecified: Secondary | ICD-10-CM | POA: Diagnosis not present

## 2016-06-01 DIAGNOSIS — Z0000 Encounter for general adult medical examination without abnormal findings: Secondary | ICD-10-CM | POA: Diagnosis not present

## 2016-06-01 DIAGNOSIS — Z6827 Body mass index (BMI) 27.0-27.9, adult: Secondary | ICD-10-CM | POA: Diagnosis not present

## 2016-06-01 DIAGNOSIS — K2270 Barrett's esophagus without dysplasia: Secondary | ICD-10-CM | POA: Diagnosis not present

## 2016-06-01 DIAGNOSIS — N183 Chronic kidney disease, stage 3 (moderate): Secondary | ICD-10-CM | POA: Diagnosis not present

## 2016-06-01 DIAGNOSIS — G6289 Other specified polyneuropathies: Secondary | ICD-10-CM | POA: Diagnosis not present

## 2016-06-01 DIAGNOSIS — M810 Age-related osteoporosis without current pathological fracture: Secondary | ICD-10-CM | POA: Diagnosis not present

## 2016-06-01 DIAGNOSIS — Z1389 Encounter for screening for other disorder: Secondary | ICD-10-CM | POA: Diagnosis not present

## 2016-09-14 DIAGNOSIS — H01021 Squamous blepharitis right upper eyelid: Secondary | ICD-10-CM | POA: Diagnosis not present

## 2016-09-14 DIAGNOSIS — H532 Diplopia: Secondary | ICD-10-CM | POA: Diagnosis not present

## 2016-09-14 DIAGNOSIS — H5052 Exophoria: Secondary | ICD-10-CM | POA: Diagnosis not present

## 2016-09-14 DIAGNOSIS — H01024 Squamous blepharitis left upper eyelid: Secondary | ICD-10-CM | POA: Diagnosis not present

## 2016-09-14 DIAGNOSIS — Z961 Presence of intraocular lens: Secondary | ICD-10-CM | POA: Diagnosis not present

## 2016-09-14 DIAGNOSIS — H04123 Dry eye syndrome of bilateral lacrimal glands: Secondary | ICD-10-CM | POA: Diagnosis not present

## 2016-09-14 DIAGNOSIS — H01022 Squamous blepharitis right lower eyelid: Secondary | ICD-10-CM | POA: Diagnosis not present

## 2016-09-14 DIAGNOSIS — H01025 Squamous blepharitis left lower eyelid: Secondary | ICD-10-CM | POA: Diagnosis not present

## 2016-10-12 DIAGNOSIS — H01024 Squamous blepharitis left upper eyelid: Secondary | ICD-10-CM | POA: Diagnosis not present

## 2016-10-12 DIAGNOSIS — H01025 Squamous blepharitis left lower eyelid: Secondary | ICD-10-CM | POA: Diagnosis not present

## 2016-10-12 DIAGNOSIS — Z961 Presence of intraocular lens: Secondary | ICD-10-CM | POA: Diagnosis not present

## 2016-10-12 DIAGNOSIS — H01021 Squamous blepharitis right upper eyelid: Secondary | ICD-10-CM | POA: Diagnosis not present

## 2016-10-12 DIAGNOSIS — H5052 Exophoria: Secondary | ICD-10-CM | POA: Diagnosis not present

## 2016-10-12 DIAGNOSIS — H532 Diplopia: Secondary | ICD-10-CM | POA: Diagnosis not present

## 2016-10-12 DIAGNOSIS — H01022 Squamous blepharitis right lower eyelid: Secondary | ICD-10-CM | POA: Diagnosis not present

## 2016-10-12 DIAGNOSIS — H04123 Dry eye syndrome of bilateral lacrimal glands: Secondary | ICD-10-CM | POA: Diagnosis not present

## 2016-10-20 ENCOUNTER — Ambulatory Visit (HOSPITAL_COMMUNITY)
Admission: RE | Admit: 2016-10-20 | Discharge: 2016-10-20 | Disposition: A | Discharge status: Home / Self Care | Payer: Medicare Other | Source: Ambulatory Visit | Attending: Internal Medicine | Admitting: Internal Medicine

## 2016-10-20 ENCOUNTER — Encounter (HOSPITAL_COMMUNITY): Payer: Self-pay

## 2016-10-20 DIAGNOSIS — M810 Age-related osteoporosis without current pathological fracture: Secondary | ICD-10-CM | POA: Diagnosis not present

## 2016-10-20 MED ORDER — DENOSUMAB 60 MG/ML ~~LOC~~ SOLN
60.0000 mg | Freq: Once | SUBCUTANEOUS | Status: AC
  Administered 2016-10-20: 60 mg via SUBCUTANEOUS
  Filled 2016-10-20: qty 1

## 2016-10-20 NOTE — Discharge Instructions (Signed)
Denosumab injection / Prolia What is this medicine? DENOSUMAB (den oh sue mab) slows bone breakdown. Prolia is used to treat osteoporosis in women after menopause and in men. Delton See is used to treat a high calcium level due to cancer and to prevent bone fractures and other bone problems caused by multiple myeloma or cancer bone metastases. Delton See is also used to treat giant cell tumor of the bone. This medicine may be used for other purposes; ask your health care provider or pharmacist if you have questions. COMMON BRAND NAME(S): Prolia, XGEVA What should I tell my health care provider before I take this medicine? They need to know if you have any of these conditions: -dental disease -having surgery or tooth extraction -infection -kidney disease -low levels of calcium or Vitamin D in the blood -malnutrition -on hemodialysis -skin conditions or sensitivity -thyroid or parathyroid disease -an unusual reaction to denosumab, other medicines, foods, dyes, or preservatives -pregnant or trying to get pregnant -breast-feeding How should I use this medicine? This medicine is for injection under the skin. It is given by a health care professional in a hospital or clinic setting. If you are getting Prolia, a special MedGuide will be given to you by the pharmacist with each prescription and refill. Be sure to read this information carefully each time. For Prolia, talk to your pediatrician regarding the use of this medicine in children. Special care may be needed. For Delton See, talk to your pediatrician regarding the use of this medicine in children. While this drug may be prescribed for children as young as 13 years for selected conditions, precautions do apply. Overdosage: If you think you have taken too much of this medicine contact a poison control center or emergency room at once. NOTE: This medicine is only for you. Do not share this medicine with others. What if I miss a dose? It is important not to  miss your dose. Call your doctor or health care professional if you are unable to keep an appointment. What may interact with this medicine? Do not take this medicine with any of the following medications: -other medicines containing denosumab This medicine may also interact with the following medications: -medicines that lower your chance of fighting infection -steroid medicines like prednisone or cortisone This list may not describe all possible interactions. Give your health care provider a list of all the medicines, herbs, non-prescription drugs, or dietary supplements you use. Also tell them if you smoke, drink alcohol, or use illegal drugs. Some items may interact with your medicine. What should I watch for while using this medicine? Visit your doctor or health care professional for regular checks on your progress. Your doctor or health care professional may order blood tests and other tests to see how you are doing. Call your doctor or health care professional for advice if you get a fever, chills or sore throat, or other symptoms of a cold or flu. Do not treat yourself. This drug may decrease your body's ability to fight infection. Try to avoid being around people who are sick. You should make sure you get enough calcium and vitamin D while you are taking this medicine, unless your doctor tells you not to. Discuss the foods you eat and the vitamins you take with your health care professional. See your dentist regularly. Brush and floss your teeth as directed. Before you have any dental work done, tell your dentist you are receiving this medicine. Do not become pregnant while taking this medicine or for 5 months  after stopping it. Talk with your doctor or health care professional about your birth control options while taking this medicine. Women should inform their doctor if they wish to become pregnant or think they might be pregnant. There is a potential for serious side effects to an unborn  child. Talk to your health care professional or pharmacist for more information. What side effects may I notice from receiving this medicine? Side effects that you should report to your doctor or health care professional as soon as possible: -allergic reactions like skin rash, itching or hives, swelling of the face, lips, or tongue -bone pain -breathing problems -dizziness -jaw pain, especially after dental work -redness, blistering, peeling of the skin -signs and symptoms of infection like fever or chills; cough; sore throat; pain or trouble passing urine -signs of low calcium like fast heartbeat, muscle cramps or muscle pain; pain, tingling, numbness in the hands or feet; seizures -unusual bleeding or bruising -unusually weak or tired Side effects that usually do not require medical attention (report to your doctor or health care professional if they continue or are bothersome): -constipation -diarrhea -headache -joint pain -loss of appetite -muscle pain -runny nose -tiredness -upset stomach This list may not describe all possible side effects. Call your doctor for medical advice about side effects. You may report side effects to FDA at 1-800-FDA-1088. Where should I keep my medicine? This medicine is only given in a clinic, doctor's office, or other health care setting and will not be stored at home. NOTE: This sheet is a summary. It may not cover all possible information. If you have questions about this medicine, talk to your doctor, pharmacist, or health care provider.  2018 Elsevier/Gold Standard (2016-08-01 19:17:21)

## 2017-02-16 DIAGNOSIS — G2581 Restless legs syndrome: Secondary | ICD-10-CM | POA: Diagnosis not present

## 2017-02-16 DIAGNOSIS — Z1389 Encounter for screening for other disorder: Secondary | ICD-10-CM | POA: Diagnosis not present

## 2017-02-16 DIAGNOSIS — K2270 Barrett's esophagus without dysplasia: Secondary | ICD-10-CM | POA: Diagnosis not present

## 2017-02-16 DIAGNOSIS — M5489 Other dorsalgia: Secondary | ICD-10-CM | POA: Diagnosis not present

## 2017-02-16 DIAGNOSIS — R7301 Impaired fasting glucose: Secondary | ICD-10-CM | POA: Diagnosis not present

## 2017-02-16 DIAGNOSIS — Z6829 Body mass index (BMI) 29.0-29.9, adult: Secondary | ICD-10-CM | POA: Diagnosis not present

## 2017-02-16 DIAGNOSIS — M810 Age-related osteoporosis without current pathological fracture: Secondary | ICD-10-CM | POA: Diagnosis not present

## 2017-02-20 ENCOUNTER — Other Ambulatory Visit: Payer: Self-pay | Admitting: Internal Medicine

## 2017-02-20 DIAGNOSIS — M545 Low back pain, unspecified: Secondary | ICD-10-CM

## 2017-02-20 DIAGNOSIS — M810 Age-related osteoporosis without current pathological fracture: Secondary | ICD-10-CM

## 2017-03-03 ENCOUNTER — Ambulatory Visit
Admission: RE | Admit: 2017-03-03 | Discharge: 2017-03-03 | Disposition: A | Discharge status: Home / Self Care | Payer: Medicare Other | Source: Ambulatory Visit | Attending: Internal Medicine | Admitting: Internal Medicine

## 2017-03-03 DIAGNOSIS — M810 Age-related osteoporosis without current pathological fracture: Secondary | ICD-10-CM

## 2017-03-03 DIAGNOSIS — S22088A Other fracture of T11-T12 vertebra, initial encounter for closed fracture: Secondary | ICD-10-CM | POA: Diagnosis not present

## 2017-03-03 DIAGNOSIS — M545 Low back pain, unspecified: Secondary | ICD-10-CM

## 2017-03-03 MED ORDER — GADOBENATE DIMEGLUMINE 529 MG/ML IV SOLN
14.0000 mL | Freq: Once | INTRAVENOUS | Status: AC | PRN
  Administered 2017-03-03: 14 mL via INTRAVENOUS

## 2017-03-05 ENCOUNTER — Ambulatory Visit: Payer: Medicare Other

## 2017-03-13 ENCOUNTER — Encounter: Payer: Self-pay | Admitting: Physical Therapy

## 2017-03-13 ENCOUNTER — Ambulatory Visit: Payer: Medicare Other | Attending: Internal Medicine | Admitting: Physical Therapy

## 2017-03-13 DIAGNOSIS — R293 Abnormal posture: Secondary | ICD-10-CM | POA: Insufficient documentation

## 2017-03-13 DIAGNOSIS — R2689 Other abnormalities of gait and mobility: Secondary | ICD-10-CM | POA: Insufficient documentation

## 2017-03-13 DIAGNOSIS — M6281 Muscle weakness (generalized): Secondary | ICD-10-CM | POA: Diagnosis not present

## 2017-03-13 NOTE — Therapy (Signed)
[facility] [address], [place], [place], [address] Phone: [phone]   Fax:  [phone]  Physical Therapy Evaluation  Patient Details  Name: Cameron Todd MRN: [id number] Date of Birth: 1932/10/04 Referring Provider: Dr. Leanna Battles  Encounter Date: 03/13/2017      PT End of Session - 03/13/17 1330    Visit Number 1   Number of Visits 10   Date for PT Re-Evaluation 05/08/17   Authorization Type Medicare g-code on 10th visit   PT Start Time 1145   PT Stop Time 1225   PT Time Calculation (min) 40 min   Activity Tolerance Patient tolerated treatment well   Behavior During Therapy [facility] for tasks assessed/performed      Past Medical History:  Diagnosis Date  . Barrett's esophagus   . Compression fracture of spine ([place])    T 10  . Diabetes ([place])   . Elevated cholesterol   . Gastric ulcer 1990  . History of colon polyps     Past Surgical History:  Procedure Laterality Date  . NO PAST SURGERIES    . UPPER GASTROINTESTINAL ENDOSCOPY      There were no vitals filed for this visit.       Subjective Assessment - 03/13/17 1158    Subjective Patient reports compression fractures due to fall in tree that is 5 feet in 2013.  Patient reports Dr. Sharlett Iles placed him into a back brace. Back brace makes the abdominal muscles contract.    Patient Stated Goals reduce pain   Currently in Pain? Yes   Pain Score 4    Pain Location Back   Pain Orientation Lower;Anterior   Pain Type Chronic pain   Pain Onset In the past 7 days   Pain Frequency Intermittent   Aggravating Factors  laying flat on bed; sitting, standing   Pain Relieving Factors brace   Effect of Pain on Daily Activities daily acitivities            [facility] PT Assessment - 03/13/17 0001      Assessment   Medical Diagnosis M54.9 back pain   Referring Provider Dr. Leanna Battles   Onset Date/Surgical Date 12/11/16   Prior Therapy none      Precautions   Precautions Other (comment)   Precaution Comments osteoporosis     Restrictions   Weight Bearing Restrictions No     Balance Screen   Has the patient fallen in the past 6 months No   Has the patient had a decrease in activity level because of a fear of falling?  No   Is the patient reluctant to leave their home because of a fear of falling?  No     Home Ecologist residence     Prior Function   Level of Independence Independent   Vocation Retired   Leisure sit down yoga     Cognition   Overall Cognitive Status Within Functional Limits for tasks assessed     Observation/Other Assessments   Observations stand at wall with measure 25cm   Focus on Therapeutic Outcomes (FOTO)  30% limitation  goal is 25% limitation     Posture/Postural Control   Posture/Postural Control Postural limitations   Postural Limitations Rounded Shoulders;Forward head;Increased thoracic kyphosis;Flexed trunk;Posterior pelvic tilt   Posture Comments lower abdomen pouches out     ROM / Strength   AROM / PROM / Strength AROM;Strength;PROM     AROM  Overall AROM Comments Did not test trunk ROM due to history     PROM   Left Hip Flexion 90   Left Hip ABduction 11     Strength   Overall Strength Comments abdominal strength is 1/5   Right Hip Flexion 4/5   Right Hip Extension 4/5   Right Hip ABduction 4/5   Left Hip Flexion 4/5   Left Hip Extension 4/5   Left Hip ABduction 4/5     Right Hip   Right Hip Flexion 90   Right Hip ABduction 8     Palpation   Palpation comment lower rib cage does not open up; tightness in upper abdominals      Transfers   Five time sit to stand comments  --  has to use hands to sit and stand     Ambulation/Gait   Ambulation/Gait Yes   Gait Pattern Decreased trunk rotation;Trunk flexed  decreased hip extension; Center of gravity is post.      Standardized Balance Assessment   Five times sit to stand comments  8  sec no hands            Objective measurements completed on examination: See above findings.                    PT Short Term Goals - 03/13/17 1325      PT SHORT TERM GOAL #1   Title independent with initial HEP   Time 4   Period Weeks   Status New   Target Date 04/10/17     PT SHORT TERM GOAL #2   Title ability to get in and out of bed with correct body mechanics with pain decreased >/= 25%   Time 4   Period Weeks   Status New   Target Date 04/10/17     PT SHORT TERM GOAL #3   Title understand correct posture to reduce strain on back and decrease pain   Time 4   Period Weeks   Status New   Target Date 04/10/17     PT SHORT TERM GOAL #4   Title understand information on osteoporosis and do's and do nots of osteoporosis   Time 4   Period Weeks   Status New   Target Date 04/10/17           PT Long Term Goals - 03/13/17 1222      PT LONG TERM GOAL #1   Title independent with HEP for postural strength   Time 8   Period Weeks   Status New   Target Date 05/08/17     PT LONG TERM GOAL #2   Title stand erect with head away from wall </= 15 cm   Time 8   Period Weeks   Status New   Target Date 05/08/17     PT LONG TERM GOAL #3   Title perform daily tasks including getting into bed with pain decreased >/= 50% due to improved posture   Time 8   Period Weeks   Status New   Target Date 05/08/17     PT LONG TERM GOAL #4   Title patient reports he walks with increased steadiness >/= 50% due to improve strength and posture   Time 8   Period Weeks   Status New   Target Date 05/08/17     PT LONG TERM GOAL #5   Title foto score limitation </= 25% limitation   Time 8   Period  Weeks   Status New   Target Date 05/08/17                Plan - 03/13/17 1310    Clinical Impression Statement Patient is a 83 year old male with low back pain that flared up in the past 3 months when he is getting into bed. Patient initially injured his  back in 2013 when he fell from a tree and fractured vertebrae.  Patient has been wearing a brace on his back since then. Patient reports his back pain is 4/10  with activity.  Patient posture consists of flexed posture, forward head, rounded shouldes, posteriorly rotated pelvis, flat lumbar spine, protruded lower abdominal, and center of gravity is posterior.  Patient ambulates with center of gravity posteriorly and feels unsteady due to his posture.  Bilateral hips are 4/5 strength.  Bil. hip flexion is 90 degrees passively making him lean backwards with sitting. Patient typically need his hands to go from sitting to standing due to decreased in bilateral hip flexion.  When patient stands erect from the wall his head is 25cm from the wall.  Patient will benefit from skilled therapy to improve postural strength, improve flexibility, improve gait.    History and Personal Factors relevant to plan of care: compression fracture 2013; osteoporosis; T11 compression fracture with scoliosis and spondylosis; peripheral neuropathy   Clinical Presentation Evolving   Clinical Presentation due to: patient low back pain is progressing making it difficulty to keep himself steady in standing and poor posture   Clinical Decision Making Moderate   Rehab Potential Good   Clinical Impairments Affecting Rehab Potential compression fracture 2013; osteoporosis; T11 compression fracture with scoliosis and spondylosis; peripheral neuropathy   PT Frequency 2x / week   PT Duration 8 weeks   PT Treatment/Interventions Electrical Stimulation;Moist Heat;Ultrasound;Therapeutic activities;Therapeutic exercise;Neuromuscular re-education;Patient/family education;Passive range of motion;Manual techniques;Dry needling;Energy conservation   PT Next Visit Plan hip stretches; body mechanics; decompression exercise for the spine; postural awareness   PT Home Exercise Plan progress as needed   Consulted and Agree with Plan of Care Patient       Patient will benefit from skilled therapeutic intervention in order to improve the following deficits and impairments:  Abnormal gait, Decreased range of motion, Difficulty walking, Increased fascial restricitons, Decreased activity tolerance, Decreased endurance, Pain, Decreased mobility, Decreased strength  Visit Diagnosis: Muscle weakness (generalized) - Plan: PT plan of care cert/re-cert  Abnormal posture - Plan: PT plan of care cert/re-cert  Other abnormalities of gait and mobility - Plan: PT plan of care cert/re-cert      G-Codes - 08/01/30 1328    Functional Assessment Tool Used (Outpatient Only) FOTO score is 30% limitation   Functional Limitation Other PT primary   Other PT Primary Current Status (T5573) At least 20 percent but less than 40 percent impaired, limited or restricted   Other PT Primary Goal Status (U2025) At least 20 percent but less than 40 percent impaired, limited or restricted       Problem List Patient Active Problem List   Diagnosis Date Noted  . Barrett's esophagus 05/20/2013  . HTN (hypertension) 05/20/2013  . GENERALIZED OSTEOARTHROSIS UNSPECIFIED SITE 06/10/2008  . EPIGASTRIC PAIN 06/10/2008  . GASTRIC ULCER, HX OF 06/10/2008  . INTERNAL HEMORRHOIDS 06/08/2008    Earlie Counts, PT 03/13/17 1:31 PM   [facility] [address], [place], [place], [address] Phone: [phone]   Fax:  [phone]  Name: Cameron Donaghey  Todd MRN: [id number] Date of Birth: 1932-07-27

## 2017-03-14 ENCOUNTER — Encounter: Payer: Self-pay | Admitting: Physical Therapy

## 2017-03-14 ENCOUNTER — Ambulatory Visit: Payer: Medicare Other | Admitting: Physical Therapy

## 2017-03-14 DIAGNOSIS — M6281 Muscle weakness (generalized): Secondary | ICD-10-CM | POA: Diagnosis not present

## 2017-03-14 DIAGNOSIS — R2689 Other abnormalities of gait and mobility: Secondary | ICD-10-CM | POA: Diagnosis not present

## 2017-03-14 DIAGNOSIS — R293 Abnormal posture: Secondary | ICD-10-CM | POA: Diagnosis not present

## 2017-03-14 NOTE — Therapy (Signed)
[facility] [address], [place], [place], [address] Phone: [phone]   Fax:  [phone]  Physical Therapy Treatment  Patient Details  Name: Cameron Todd MRN: [id number] Date of Birth: 11/09/1932 Referring Provider: Dr. Leanna Battles  Encounter Date: 03/14/2017      PT End of Session - 03/14/17 1447    Visit Number 2   Number of Visits 10   Date for PT Re-Evaluation 05/08/17   Authorization Type Medicare g-code on 10th visit   PT Start Time 1443   PT Stop Time 1523   PT Time Calculation (min) 40 min   Activity Tolerance Patient tolerated treatment well   Behavior During Therapy [facility] for tasks assessed/performed      Past Medical History:  Diagnosis Date  . Barrett's esophagus   . Compression fracture of spine ([place])    T 10  . Diabetes ([place])   . Elevated cholesterol   . Gastric ulcer 1990  . History of colon polyps     Past Surgical History:  Procedure Laterality Date  . NO PAST SURGERIES    . UPPER GASTROINTESTINAL ENDOSCOPY      There were no vitals filed for this visit.      Subjective Assessment - 03/14/17 1446    Subjective I felt okay with no increase with pain.    Patient Stated Goals reduce pain   Currently in Pain? No/denies                         [facility] Adult PT Treatment/Exercise - 03/14/17 0001      Lumbar Exercises: Supine   Ab Set 10 reps  reclined position   AB Set Limitations push hands into knees to contract abdominals with many tactile cues   Other Supine Lumbar Exercises balloon breath to improve expansion of lower rib cage     Knee/Hip Exercises: Aerobic   Nustep level 2 x 6 min; seat #6; arm #9     Manual Therapy   Manual Therapy Manual Lymphatic Drainage (MLD)   Manual Lymphatic Drainage (MLD) to lower abdomen to reduce swelling of lower abdoment                 PT Education - 03/14/17 1524    Education provided Yes   Education  Details hip exercises; decompression exercises   Person(s) Educated Patient   Methods Explanation;Demonstration;Verbal cues;Handout   Comprehension Verbalized understanding;Returned demonstration          PT Short Term Goals - 03/13/17 1325      PT SHORT TERM GOAL #1   Title independent with initial HEP   Time 4   Period Weeks   Status New   Target Date 04/10/17     PT SHORT TERM GOAL #2   Title ability to get in and out of bed with correct body mechanics with pain decreased >/= 25%   Time 4   Period Weeks   Status New   Target Date 04/10/17     PT SHORT TERM GOAL #3   Title understand correct posture to reduce strain on back and decrease pain   Time 4   Period Weeks   Status New   Target Date 04/10/17     PT SHORT TERM GOAL #4   Title understand information on osteoporosis and do's and do nots of osteoporosis   Time 4   Period Weeks   Status New   Target  Date 04/10/17           PT Long Term Goals - Mar 15, 2017 1222      PT LONG TERM GOAL #1   Title independent with HEP for postural strength   Time 8   Period Weeks   Status New   Target Date 05/08/17     PT LONG TERM GOAL #2   Title stand erect with head away from wall </= 15 cm   Time 8   Period Weeks   Status New   Target Date 05/08/17     PT LONG TERM GOAL #3   Title perform daily tasks including getting into bed with pain decreased >/= 50% due to improved posture   Time 8   Period Weeks   Status New   Target Date 05/08/17     PT LONG TERM GOAL #4   Title patient reports he walks with increased steadiness >/= 50% due to improve strength and posture   Time 8   Period Weeks   Status New   Target Date 05/08/17     PT LONG TERM GOAL #5   Title foto score limitation </= 25% limitation   Time 8   Period Weeks   Status New   Target Date 05/08/17               Plan - 03/14/17 1447    Clinical Impression Statement Patient is educated on decompression exercise to decrease strain on  spine and begin the strength of back extensors.  Patient has difficulty with contracting the lower abdominals due to long term of not using them.  Patient did well with nustep. Patient will benefit from skilled therapy to improve postural strength, improve flexibility and gait.    Rehab Potential Good   Clinical Impairments Affecting Rehab Potential compression fracture 2013; osteoporosis; T11 compression fracture with scoliosis and spondylosis; peripheral neuropathy   PT Frequency 2x / week   PT Duration 8 weeks   PT Treatment/Interventions Electrical Stimulation;Moist Heat;Ultrasound;Therapeutic activities;Therapeutic exercise;Neuromuscular re-education;Patient/family education;Passive range of motion;Manual techniques;Dry needling;Energy conservation   PT Next Visit Plan hip stretches; body mechanics; information on osteoporosis; postural awareness   PT Home Exercise Plan progress as needed   Consulted and Agree with Plan of Care Patient      Patient will benefit from skilled therapeutic intervention in order to improve the following deficits and impairments:  Abnormal gait, Decreased range of motion, Difficulty walking, Increased fascial restricitons, Decreased activity tolerance, Decreased endurance, Pain, Decreased mobility, Decreased strength  Visit Diagnosis: Muscle weakness (generalized)  Abnormal posture  Other abnormalities of gait and mobility       G-Codes - 03/15/2017 1328    Functional Assessment Tool Used (Outpatient Only) FOTO score is 30% limitation   Functional Limitation Other PT primary   Other PT Primary Current Status (V6720) At least 20 percent but less than 40 percent impaired, limited or restricted   Other PT Primary Goal Status (N4709) At least 20 percent but less than 40 percent impaired, limited or restricted      Problem List Patient Active Problem List   Diagnosis Date Noted  . Barrett's esophagus 05/20/2013  . HTN (hypertension) 05/20/2013  .  GENERALIZED OSTEOARTHROSIS UNSPECIFIED SITE 06/10/2008  . EPIGASTRIC PAIN 06/10/2008  . GASTRIC ULCER, HX OF 06/10/2008  . INTERNAL HEMORRHOIDS 06/08/2008    Earlie Counts, PT 03/14/17 3:30 PM   [facility] [address], [place], [place], [address] Phone: [phone]  Fax:  [phone]  Name: GEROGE GILLIAM MRN: [id number] Date of Birth: 10-Nov-1932

## 2017-03-14 NOTE — Patient Instructions (Addendum)
ABDUCTION: Standing (Active)    Stand, feet flat. Lift right leg out to side. Do move trunk side to side and foot stays straight Complete _1__ sets of _15__ repetitions. Perform __1_ sessions per day.  http://gtsc.exer.us/110   Copyright  VHI. All rights reserved.   Back Leg Kick    Swing leg back as far as possible. Return to center. Repeat with other leg. Repeat _15___ times. Do _1___ sessions per day.  http://gt2.exer.us/483     Copyright  VHI. All rights reserved.  Head Press With [place] chin SLIGHTLY toward chest, keep mouth closed. Feel weight on back of head. Increase weight by pressing head down. Hold _5__ seconds. Relax. Repeat _5__ times. Surface: do in recliner Copyright  VHI. All rights reserved.    Shoulder Press    Press both shoulders down. Hold _5__ seconds. Repeat _5__ times.  1 time per day. Do in your recliner Copyright  VHI. All rights reserved.   Leg Straightener / Heel Extender    Straighten one leg down. Use the yoga strap to push into. Pull toes AND forefoot toward knee, extend heel. Hold foot position _5__ seconds. Relax the foot. Repeat 1 time. Re-bend knee. Do other leg. Each leg _5__ times. Do in recliner.   Copyright  VHI. All rights reserved.  Balloon Breath    Place hands LIGHTLY on belly below navel. Imagine a balloon inside belly. Blow up balloon on breath IN, deflate balloon on breath OUT. Contract abdominals slightly to assist breath OUT. Do 10 times.   Copyright  VHI. All rights reserved.  [facility] [address], [place], [place] [address] Phone # [phone] Fax [phone]

## 2017-03-19 ENCOUNTER — Ambulatory Visit: Payer: Medicare Other | Admitting: Physical Therapy

## 2017-03-19 ENCOUNTER — Encounter: Payer: Self-pay | Admitting: Physical Therapy

## 2017-03-19 DIAGNOSIS — R293 Abnormal posture: Secondary | ICD-10-CM | POA: Diagnosis not present

## 2017-03-19 DIAGNOSIS — M6281 Muscle weakness (generalized): Secondary | ICD-10-CM

## 2017-03-19 DIAGNOSIS — R2689 Other abnormalities of gait and mobility: Secondary | ICD-10-CM

## 2017-03-19 NOTE — Patient Instructions (Signed)
RE-ALIGNMENT ROUTINE EXERCISES-OSTEOPROROSIS BASIC FOR POSTURAL CORRECTION   RE-ALIGNMENT Tips BENEFITS: 1.It helps to re-align the curves of the back and improve standing posture. 2.It allows the back muscles to rest and strengthen in preparation for more activity. FREQUENCY: Daily, even after weeks, months and years of more advanced exercises. START: 1.All exercises start in the same position: lying on the back, arms resting on the supporting surface, palms up and slightly away from the body, backs of hands down, knees bent, feet flat. 2.The head, neck, arms, and legs are supported according to specific instructions of your therapist. Copyright  VHI. All rights reserved.    1. Decompression Exercise: Basic.   Takes compression off the vertebral bodies; increases tolerance for lying on the back; helps relieve back pain. Pillow with towel rolled under neck ok but NOT TOO Much!!   Lie on back on firm surface, knees bent, feet flat, arms turned up, out to sides (~35 degrees). Head neck and arms supported as necessary. Time _5-15__ minutes. Surface: floor     2. Shoulder Press  Strengthens upper back extensors and scapular retractors. Press head and shoulders GENTLY into pillow.    Press both shoulders down. Hold _2-3__ seconds. Repeat _3-5__ times. Surface: floor        3. Head Press With [place]  Strengthens neck extensors   Tuck chin SLIGHTLY toward chest, keep mouth closed. Feel weight on back of head. Increase weight by pressing head down. Hold _2-3__ seconds. Relax. Repeat 3-5___ times. Surface: floor   4. Leg Lengthener: stretches quadratus lumborum and hip flexors.  Strengthens quads and ankle dorsiflexors.  No picture for some reason..This stretches one leg out at a time for 3 seconds.    #5: Ball squeeze between knees: Lay on your back with head supported. Inhale through nose, exhale out your mouth making a little noise as you squeeze the ball and flatten the  lower abs gently. Do 10x  #6 Keep ball between knees and get your yellow band. Put the band in your hands and lift arms up over your chest. Keep a little squeeze on the ball as you pull the band out, elbows are straight, and control it slowly in. Do this 10x.   External Rotation: Sitting (Dumbbell) Use your band instead of the weights in the picture. Sit on stable chair.    Elbows steady, rotate forearms out holding the band. Do __2__ sets. Complete _10___ repetitions.  http://sb.exer.us/298   Copyright  VHI. All rights reserved.

## 2017-03-19 NOTE — Therapy (Signed)
[facility] [address], [place], [place], [address] Phone: [phone]   Fax:  [phone]  Physical Therapy Treatment  Patient Details  Name: Cameron Todd MRN: [id number] Date of Birth: 04-15-1933 Referring Provider: Dr. Leanna Battles  Encounter Date: 03/19/2017      PT End of Session - 03/19/17 1443    Visit Number 3   Number of Visits 10   Date for PT Re-Evaluation 05/08/17   Authorization Type Medicare g-code on 10th visit   PT Start Time 1443   PT Stop Time 1525   PT Time Calculation (min) 42 min   Activity Tolerance Patient tolerated treatment well   Behavior During Therapy [facility] for tasks assessed/performed      Past Medical History:  Diagnosis Date  . Barrett's esophagus   . Compression fracture of spine ([place])    T 10  . Diabetes ([place])   . Elevated cholesterol   . Gastric ulcer 1990  . History of colon polyps     Past Surgical History:  Procedure Laterality Date  . NO PAST SURGERIES    . UPPER GASTROINTESTINAL ENDOSCOPY      There were no vitals filed for this visit.      Subjective Assessment - 03/19/17 1444    Subjective No pain pt reports.    Currently in Pain? No/denies   Multiple Pain Sites No            [facility] PT Assessment - 03/19/17 0001      Posture/Postural Control   Postural Limitations --  164.5 cm at wall for height                     [facility] Adult PT Treatment/Exercise - 03/19/17 0001      Lumbar Exercises: Supine   Ab Set --  2 x 5 with ball squeeze and VC to push breathe out mouth   Clam --  red band 10x     Knee/Hip Exercises: Aerobic   Nustep L2 x 8 min LE only  PTA present, added noodle for thoarcic     Shoulder Exercises: Supine   Horizontal ABduction Strengthening;20 reps;Theraband   Theraband Level (Shoulder Horizontal ABduction) Level 1 (Yellow)  Ball squeeze concurrent to conect with core   Other Supine Exercises Decompression x  5 min via Roddie Mc press/shoulder press 5x each  leg lengthener bil 3x     Shoulder Exercises: Seated   External Rotation Strengthening;Both;10 reps;Theraband  noodle for back   Theraband Level (Shoulder External Rotation) Level 1 (Yellow)                PT Education - 03/19/17 1511    Education provided Yes   Education Details HEP progression   Person(s) Educated Patient   Methods Explanation;Demonstration;Tactile cues;Verbal cues;Handout   Comprehension Verbalized understanding;Returned demonstration;Verbal cues required          PT Short Term Goals - 03/13/17 1325      PT SHORT TERM GOAL #1   Title independent with initial HEP   Time 4   Period Weeks   Status New   Target Date 04/10/17     PT SHORT TERM GOAL #2   Title ability to get in and out of bed with correct body mechanics with pain decreased >/= 25%   Time 4   Period Weeks   Status New   Target Date 04/10/17     PT SHORT TERM GOAL #3  Title understand correct posture to reduce strain on back and decrease pain   Time 4   Period Weeks   Status New   Target Date 04/10/17     PT SHORT TERM GOAL #4   Title understand information on osteoporosis and do's and do nots of osteoporosis   Time 4   Period Weeks   Status New   Target Date 04/10/17           PT Long Term Goals - 03/13/17 1222      PT LONG TERM GOAL #1   Title independent with HEP for postural strength   Time 8   Period Weeks   Status New   Target Date 05/08/17     PT LONG TERM GOAL #2   Title stand erect with head away from wall </= 15 cm   Time 8   Period Weeks   Status New   Target Date 05/08/17     PT LONG TERM GOAL #3   Title perform daily tasks including getting into bed with pain decreased >/= 50% due to improved posture   Time 8   Period Weeks   Status New   Target Date 05/08/17     PT LONG TERM GOAL #4   Title patient reports he walks with increased steadiness >/= 50% due to improve strength and  posture   Time 8   Period Weeks   Status New   Target Date 05/08/17     PT LONG TERM GOAL #5   Title foto score limitation </= 25% limitation   Time 8   Period Weeks   Status New   Target Date 05/08/17               Plan - 03/19/17 1443    Clinical Impression Statement Pt able to lay flatter during supine exercises today. Added to HEP to progress muscular strength. pt improving in finding his lower abdominals but still difficult.    Clinical Impairments Affecting Rehab Potential compression fracture 2013; osteoporosis; T11 compression fracture with scoliosis and spondylosis; peripheral neuropathy   PT Frequency 2x / week   PT Duration 8 weeks   PT Treatment/Interventions Electrical Stimulation;Moist Heat;Ultrasound;Therapeutic activities;Therapeutic exercise;Neuromuscular re-education;Patient/family education;Passive range of motion;Manual techniques;Dry needling;Energy conservation   PT Next Visit Plan Review HEP given and continue with postural strength and endurance, core and hip stretches   Consulted and Agree with Plan of Care Patient      Patient will benefit from skilled therapeutic intervention in order to improve the following deficits and impairments:  Abnormal gait, Decreased range of motion, Difficulty walking, Increased fascial restricitons, Decreased activity tolerance, Decreased endurance, Pain, Decreased mobility, Decreased strength  Visit Diagnosis: Muscle weakness (generalized)  Abnormal posture  Other abnormalities of gait and mobility     Problem List Patient Active Problem List   Diagnosis Date Noted  . Barrett's esophagus 05/20/2013  . HTN (hypertension) 05/20/2013  . GENERALIZED OSTEOARTHROSIS UNSPECIFIED SITE 06/10/2008  . EPIGASTRIC PAIN 06/10/2008  . GASTRIC ULCER, HX OF 06/10/2008  . INTERNAL HEMORRHOIDS 06/08/2008    Daishaun Ayre, PTA 03/19/2017, 3:34 PM  [facility] [address] [place], [place], [address] Phone: [phone]   Fax:  [phone]  Name: SERAFINO BURCIAGA MRN: [id number] Date of Birth: 09-01-1932

## 2017-03-21 ENCOUNTER — Encounter: Payer: Self-pay | Admitting: Physical Therapy

## 2017-03-21 ENCOUNTER — Ambulatory Visit: Payer: Medicare Other | Admitting: Physical Therapy

## 2017-03-21 DIAGNOSIS — R2689 Other abnormalities of gait and mobility: Secondary | ICD-10-CM

## 2017-03-21 DIAGNOSIS — M6281 Muscle weakness (generalized): Secondary | ICD-10-CM

## 2017-03-21 DIAGNOSIS — R293 Abnormal posture: Secondary | ICD-10-CM | POA: Diagnosis not present

## 2017-03-21 NOTE — Patient Instructions (Addendum)
  Resisted Horizontal Abduction: Bilateral   Sit yellow tubing in both hands, arms out in front. Keeping arms straight, pinch shoulder blades together and stretch arms out. Repeat _10___ times per set. Do 2____ sets per session. Do _1___ sessions per day.    Scapular Retraction: Elbow Flexion (Standing)   Place yellow band in both hands. With elbows bent to 90, pinch shoulder blades together and rotate arms out, keeping elbows bent. Repeat _10___ times per set. Do _1___ sets per session. Do many____ sessions per day.    Strengthening: Resisted Extension   Hold tubing in right hand, arm forward. Pull arm back, elbow straight. Repeat _10___ times per set. Do _2___ sets per session. Do _1___ sessions per day.  Can place band around the front of your body and perform with both arms at the same time.  Tandem Stance    Do next to the counter to hold onto. Right foot ahead  of left,  Stand on Foot Triangle of Support with both feet. Balance in this position 30___ seconds. Do with left foot in front of right. After 1 week you can put one foot straight ahead of the other.  Copyright  VHI. All rights reserved.  [facility] [address] [place], [place] [address] Phone # [phone] Fax [phone]

## 2017-03-21 NOTE — Therapy (Signed)
[facility] [address], [place], [place], [address] Phone: [phone]   Fax:  [phone]  Physical Therapy Treatment  Patient Details  Name: Cameron Todd MRN: [id number] Date of Birth: 11/08/1932 Referring Provider: Dr. Leanna Battles  Encounter Date: 03/21/2017      PT End of Session - 03/21/17 1440    Visit Number 4   Number of Visits 10   Date for PT Re-Evaluation 05/08/17   Authorization Type Medicare g-code on 10th visit   PT Start Time 1400   PT Stop Time 1440   PT Time Calculation (min) 40 min   Activity Tolerance Patient tolerated treatment well   Behavior During Therapy [facility] for tasks assessed/performed      Past Medical History:  Diagnosis Date  . Barrett's esophagus   . Compression fracture of spine ([place])    T 10  . Diabetes ([place])   . Elevated cholesterol   . Gastric ulcer 1990  . History of colon polyps     Past Surgical History:  Procedure Laterality Date  . NO PAST SURGERIES    . UPPER GASTROINTESTINAL ENDOSCOPY      There were no vitals filed for this visit.      Subjective Assessment - 03/21/17 1408    Subjective I feel taller   Patient Stated Goals reduce pain   Currently in Pain? Yes   Pain Score 2    Pain Location Back   Pain Orientation Lower   Pain Descriptors / Indicators Sore   Pain Type Chronic pain   Pain Onset In the past 7 days   Pain Frequency Intermittent   Aggravating Factors  laying flat on bed; sitting; standing   Pain Relieving Factors brace   Effect of Pain on Daily Activities daily acitivites   Multiple Pain Sites No            [facility] PT Assessment - 03/21/17 0001      Observation/Other Assessments   Observations stand at wall with measure 15cm                     [facility] Adult PT Treatment/Exercise - 03/21/17 0001      Neuro Re-ed    Neuro Re-ed Details  walking forward with head movements with contact guard     Knee/Hip  Exercises: Aerobic   Other Aerobic UBE in standing going backwards 4 min  level 0 working on posture     Knee/Hip Exercises: Standing   Heel Raises Both;15 reps   Heel Raises Limitations 2 sets; 30 second heel cord stretch   Other Standing Knee Exercises going up and down stairs with no handrails 5 times wiht 2 times lean on hand rail     Shoulder Exercises: Seated   Horizontal ABduction Strengthening;Both;10 reps;Theraband   Theraband Level (Shoulder Horizontal ABduction) Level 1 (Yellow)   Horizontal ABduction Limitations tactile cues to prevent head going forward   External Rotation Strengthening;Both;10 reps;Theraband  noodle for back   Theraband Level (Shoulder External Rotation) Level 1 (Yellow)   External Rotation Limitations tactile cues to keep head retracted                PT Education - 03/21/17 1434    Education provided Yes   Education Details scapula strength; tandem stance   Person(s) Educated Patient   Methods Explanation;Demonstration;Verbal cues;Handout   Comprehension Verbalized understanding;Returned demonstration          PT Short Term  Goals - 03/21/17 1443      PT SHORT TERM GOAL #1   Title independent with initial HEP   Time 4   Period Weeks   Status Achieved     PT SHORT TERM GOAL #2   Title ability to get in and out of bed with correct body mechanics with pain decreased >/= 25%   Time 4   Period Weeks   Status On-going     PT SHORT TERM GOAL #3   Title understand correct posture to reduce strain on back and decrease pain   Time 4   Period Weeks   Status Achieved     PT SHORT TERM GOAL #4   Title understand information on osteoporosis and do's and do nots of osteoporosis   Time 4   Period Weeks   Status On-going           PT Long Term Goals - 03/13/17 1222      PT LONG TERM GOAL #1   Title independent with HEP for postural strength   Time 8   Period Weeks   Status New   Target Date 05/08/17     PT LONG TERM GOAL #2    Title stand erect with head away from wall </= 15 cm   Time 8   Period Weeks   Status New   Target Date 05/08/17     PT LONG TERM GOAL #3   Title perform daily tasks including getting into bed with pain decreased >/= 50% due to improved posture   Time 8   Period Weeks   Status New   Target Date 05/08/17     PT LONG TERM GOAL #4   Title patient reports he walks with increased steadiness >/= 50% due to improve strength and posture   Time 8   Period Weeks   Status New   Target Date 05/08/17     PT LONG TERM GOAL #5   Title foto score limitation </= 25% limitation   Time 8   Period Weeks   Status New   Target Date 05/08/17               Plan - 03/21/17 1411    Clinical Impression Statement Patient is unsteady when he is walking with head movements and has to walk very slowly.  Patient is able  to stand straighter due to head from the wall is 15 cm compared to the initial evaluation was 25 cm.  Patient is able to go from sit to stand and back with increased hip flexion.  Patient will benefit from skilled  therapy to work on trunk extensors, balance and posture.    Rehab Potential Good   Clinical Impairments Affecting Rehab Potential compression fracture 2013; osteoporosis; T11 compression fracture with scoliosis and spondylosis; peripheral neuropathy   PT Frequency 2x / week   PT Duration 8 weeks   PT Treatment/Interventions Electrical Stimulation;Moist Heat;Ultrasound;Therapeutic activities;Therapeutic exercise;Neuromuscular re-education;Patient/family education;Passive range of motion;Manual techniques;Dry needling;Energy conservation   PT Next Visit Plan Review HEP given with core exercise;  continue with postural strength and endurance, core and hip stretches   PT Home Exercise Plan educate on do and don not of osteoporosis   Consulted and Agree with Plan of Care Patient      Patient will benefit from skilled therapeutic intervention in order to improve the  following deficits and impairments:  Abnormal gait, Decreased range of motion, Difficulty walking, Increased fascial restricitons, Decreased activity tolerance, Decreased endurance,  Pain, Decreased mobility, Decreased strength  Visit Diagnosis: Muscle weakness (generalized)  Abnormal posture  Other abnormalities of gait and mobility     Problem List Patient Active Problem List   Diagnosis Date Noted  . Barrett's esophagus 05/20/2013  . HTN (hypertension) 05/20/2013  . GENERALIZED OSTEOARTHROSIS UNSPECIFIED SITE 06/10/2008  . EPIGASTRIC PAIN 06/10/2008  . GASTRIC ULCER, HX OF 06/10/2008  . INTERNAL HEMORRHOIDS 06/08/2008    Earlie Counts, PT 03/21/17 2:45 PM    [facility] [address] [place], [place], [address] Phone: [phone]   Fax:  [phone]  Name: IVAAN LIDDY MRN: [id number] Date of Birth: 11/24/1932

## 2017-03-27 ENCOUNTER — Ambulatory Visit: Payer: Medicare Other | Attending: Internal Medicine | Admitting: Physical Therapy

## 2017-03-27 ENCOUNTER — Encounter: Payer: Self-pay | Admitting: Physical Therapy

## 2017-03-27 DIAGNOSIS — R2689 Other abnormalities of gait and mobility: Secondary | ICD-10-CM | POA: Diagnosis not present

## 2017-03-27 DIAGNOSIS — R293 Abnormal posture: Secondary | ICD-10-CM | POA: Diagnosis not present

## 2017-03-27 DIAGNOSIS — M6281 Muscle weakness (generalized): Secondary | ICD-10-CM | POA: Diagnosis not present

## 2017-03-27 NOTE — Therapy (Signed)
[facility] [address], [place], [place], [address] Phone: [phone]   Fax:  [phone]  Physical Therapy Treatment  Patient Details  Name: Cameron Todd MRN: [id number] Date of Birth: 1933/04/21 Referring Provider: Dr. Leanna Battles  Encounter Date: 03/27/2017      PT End of Session - 03/27/17 1051    Visit Number 5   Number of Visits 10   Date for PT Re-Evaluation 05/08/17   Authorization Type Medicare g-code on 10th visit   PT Start Time 1015   PT Stop Time 1053   PT Time Calculation (min) 38 min   Activity Tolerance Patient tolerated treatment well   Behavior During Therapy [facility] for tasks assessed/performed      Past Medical History:  Diagnosis Date  . Barrett's esophagus   . Compression fracture of spine ([place])    T 10  . Diabetes ([place])   . Elevated cholesterol   . Gastric ulcer 1990  . History of colon polyps     Past Surgical History:  Procedure Laterality Date  . NO PAST SURGERIES    . UPPER GASTROINTESTINAL ENDOSCOPY      There were no vitals filed for this visit.      Subjective Assessment - 03/27/17 1020    Subjective I still have tingling in my legs.  I still feel unsteady.  I am more conscious with being erect.  I am doing the nustep 5 days week for 15 min. My knees will seize up with the tendons feeling stiff.    Patient Stated Goals reduce pain   Currently in Pain? Yes   Pain Score 2    Pain Location Back   Pain Orientation Lower   Pain Descriptors / Indicators Sore   Pain Type Chronic pain   Pain Onset In the past 7 days   Pain Frequency Intermittent   Aggravating Factors  laying flat on bed; sitting; standing   Pain Relieving Factors brace   Effect of Pain on Daily Activities daily activities   Multiple Pain Sites No                         [facility] Adult PT Treatment/Exercise - 03/27/17 0001      Knee/Hip Exercises: Aerobic   Nustep L2 x 7 min    discussed  progress     Knee/Hip Exercises: Standing   Heel Raises Both;15 reps;2 sets  vc to come down slowly   Hip Abduction Stengthening;Right;Left;20 reps;Knee straight   Abduction Limitations VC on technique   Hip Extension Stengthening;Right;Left;Knee straight;20 reps   Extension Limitations VC to go on slowly   Other Standing Knee Exercises going up and down stairs with no handrails 5 times wiht 2 times lean on hand rail   Other Standing Knee Exercises mini squat with holding onto the counter hold 5 sec 15 times                PT Education - 03/27/17 1043    Education Details body mechanics with daily tasks   Person(s) Educated Patient   Methods Explanation;Demonstration;Verbal cues;Handout   Comprehension Verbalized understanding;Returned demonstration          PT Short Term Goals - 03/27/17 1050      PT SHORT TERM GOAL #2   Title ability to get in and out of bed with correct body mechanics with pain decreased >/= 25%   Time 4   Period Weeks  Status Achieved     PT SHORT TERM GOAL #3   Title understand correct posture to reduce strain on back and decrease pain   Time 4   Period Weeks   Status Achieved     PT SHORT TERM GOAL #4   Title understand information on osteoporosis and do's and do nots of osteoporosis   Time 4   Period Weeks   Status Achieved           PT Long Term Goals - 03/13/17 1222      PT LONG TERM GOAL #1   Title independent with HEP for postural strength   Time 8   Period Weeks   Status New   Target Date 05/08/17     PT LONG TERM GOAL #2   Title stand erect with head away from wall </= 15 cm   Time 8   Period Weeks   Status New   Target Date 05/08/17     PT LONG TERM GOAL #3   Title perform daily tasks including getting into bed with pain decreased >/= 50% due to improved posture   Time 8   Period Weeks   Status New   Target Date 05/08/17     PT LONG TERM GOAL #4   Title patient reports he walks with increased  steadiness >/= 50% due to improve strength and posture   Time 8   Period Weeks   Status New   Target Date 05/08/17     PT LONG TERM GOAL #5   Title foto score limitation </= 25% limitation   Time 8   Period Weeks   Status New   Target Date 05/08/17               Plan - 03/27/17 1022    Clinical Impression Statement Patient has met all of his goals.  Patient continues to stand straighter.  Patient understands correct body mechanics to reduce strain on the spine.  Patient is able to walk with bigger steps.  Patient will benefit from skilled therapy to improve postural strength  and balance.    Rehab Potential Good   Clinical Impairments Affecting Rehab Potential compression fracture 2013; osteoporosis; T11 compression fracture with scoliosis and spondylosis; peripheral neuropathy   PT Frequency 2x / week   PT Duration 8 weeks   PT Treatment/Interventions Electrical Stimulation;Moist Heat;Ultrasound;Therapeutic activities;Therapeutic exercise;Neuromuscular re-education;Patient/family education;Passive range of motion;Manual techniques;Dry needling;Energy conservation   PT Next Visit Plan  continue with postural strength and endurance, balance exercises   PT Home Exercise Plan progress as needed   Recommended Other Services sent second request to sign initial summary to MD   Consulted and Agree with Plan of Care Patient      Patient will benefit from skilled therapeutic intervention in order to improve the following deficits and impairments:  Abnormal gait, Decreased range of motion, Difficulty walking, Increased fascial restricitons, Decreased activity tolerance, Decreased endurance, Pain, Decreased mobility, Decreased strength  Visit Diagnosis: Muscle weakness (generalized)  Abnormal posture  Other abnormalities of gait and mobility     Problem List Patient Active Problem List   Diagnosis Date Noted  . Barrett's esophagus 05/20/2013  . HTN (hypertension) 05/20/2013   . GENERALIZED OSTEOARTHROSIS UNSPECIFIED SITE 06/10/2008  . EPIGASTRIC PAIN 06/10/2008  . GASTRIC ULCER, HX OF 06/10/2008  . INTERNAL HEMORRHOIDS 06/08/2008    Earlie Counts, PT 03/27/17 10:58 AM   [facility] [address], [place], [place], [address]  Phone: [phone]   Fax:  [phone]  Name: TIMARION AGCAOILI MRN: [id number] Date of Birth: October 21, 1932

## 2017-03-27 NOTE — Patient Instructions (Addendum)
DO's and DON'T's   Avoid and/or Minimize positions of forward bending ( flexion)  Side bending and rotation of the trunk  Especially when movements occur together   When your back aches:   Don't sit down   Lie down on your back with a small pillow under your head and one under your knees or as outlined by our therapist. Or, lie in the 90/90 position ( on the floor with your feet and legs on the sofa with knees and hips bent to 90 degrees)  Can sit reclined  Tying or putting on your shoes:   Don't bend over to tie your shoes or put on socks.   Instead, bring one foot up, cross it over the opposite knee and bend forward (hinge) at the hips to so the task.  Keep your back straight.  If you cannot do this safely, then you need to use long handled assistive devices such as a shoehorn and sock puller.  Can bend at your hips to tie your shoes  Exercising:  Don't engage in ballistic types of exercise routines such as high-impact aerobics or jumping rope  Don't do exercises in the gym that bring you forward (abdominal crunches, sit-ups, touching your  toes, knee-to-chest, straight leg raising.)  Follow a regular exercise program that includes a variety of different weight-bearing activities, such as low-impact aerobics, T' ai chi or walking as your physical therapist advises  Do exercises that emphasize return to normal body alignment and strengthening of the muscles that keep your back straight, as outlined in this program or by your therapist  Household tasks:  Don't reach unnecessarily or twist your trunk when mopping, sweeping, vacuuming, raking, making beds, weeding gardens, getting objects ou of cupboards, etc.  Keep your broom, mop, vacuum, or rake close to you and mover your whole body as you move them. Walk over to the area on which you are working. Arrange kitchen, bathroom, and bedroom shelves so that frequently used items may be  reached without excessive bending, twisting, and reaching.  Use a sturdy stool if necessary.  Don't bend from the waist to pick up something up  Off the floor, out of the trunk of your car, or to brush your teeth, wash your face, etc.   Bend at the knees, keeping back straight as possible. Use a reacher if necessary.   Prevention of fracture is the so-called "BOTTOm -Line" in the management of OSTEOPOROSIS. Do not take unnecessary chances in movement. Once a compression fracture occurs, the process is very difficult to control; one fracture is frequently followed by many more.    Getting Into and Out of Bed    Sit on edge of bed, feet on floor. Lie down sideways. Roll over onto back. Keep back straight. Use hand and elbow to lower and raise trunk. Move shoulders and hips at same rate to avoid twisting the back. To get out of bed, reverse movement.   Copyright  VHI. All rights reserved.    Coughing, Sneezing    Stabilize body by bending knees slightly and placing one hand on abdomen or in small of back to help stabilize your back. Alternatively, hold onto a kitchen counter, table or some sturdy object to minimize sudden bending forces on the back. Bend at hips  Copyright  VHI. All rights reserved.  Eating    Sit, protecting natural arch in low back. Bring food to mouth, not mouth to food. Do not lean on elbows or arms. Bring hand  to mouth instead of body to the fork.  Copyright  VHI. All rights reserved.  Going Up Steps    Stay close to rail. Back straight, chest up. Push, don't pull, with hand on rail. Push with knee of upper leg as you go up the step.  Copyright  VHI. All rights reserved.  Going Down Steps    Stay close to rail. Back straight, chest up. Support on rail as necessary. Keep feet and knees straight. Support on upper leg as you descend to next step. Do not go down steps sideways.  Copyright  VHI. All rights reserved.  Gardening - Mowing    Keep  arms close to sides and walk with lawn mower.   Copyright  VHI. All rights reserved.  Gardening - Weeding / Engineer, maintenance or kneel. Knee pads may be helpful.   Copyright  VHI. All rights reserved.  Standing    For prolonged standing, alternate placing one foot in front of the other or on a stool. Wear low-heeled shoes, and maintain good posture.   Copyright  VHI. All rights reserved.  [name] [address] [place], [place] [address] Phone # [phone] Fax [phone]

## 2017-03-28 ENCOUNTER — Encounter: Payer: Self-pay | Admitting: Physical Therapy

## 2017-03-28 ENCOUNTER — Ambulatory Visit: Payer: Medicare Other | Admitting: Physical Therapy

## 2017-03-28 DIAGNOSIS — R293 Abnormal posture: Secondary | ICD-10-CM | POA: Diagnosis not present

## 2017-03-28 DIAGNOSIS — R2689 Other abnormalities of gait and mobility: Secondary | ICD-10-CM

## 2017-03-28 DIAGNOSIS — M6281 Muscle weakness (generalized): Secondary | ICD-10-CM

## 2017-03-28 NOTE — Therapy (Signed)
[facility] [address], [place], [place], [address] Phone: [phone]   Fax:  [phone]  Physical Therapy Treatment  Patient Details  Name: Cameron Todd MRN: [id number] Date of Birth: [birth year] Referring Provider: Dr. Leanna Battles  Encounter Date: 03/28/2017      PT End of Session - 03/28/17 1448    Visit Number 6   Number of Visits 10   Date for PT Re-Evaluation 05/08/17   Authorization Type Medicare g-code on 10th visit   PT Start Time 1444   PT Stop Time 1523   PT Time Calculation (min) 39 min   Activity Tolerance Patient tolerated treatment well   Behavior During Therapy [facility] for tasks assessed/performed      Past Medical History:  Diagnosis Date  . Barrett's esophagus   . Compression fracture of spine ([place])    T 10  . Diabetes ([place])   . Elevated cholesterol   . Gastric ulcer 1990  . History of colon polyps     Past Surgical History:  Procedure Laterality Date  . NO PAST SURGERIES    . UPPER GASTROINTESTINAL ENDOSCOPY      There were no vitals filed for this visit.      Subjective Assessment - 03/28/17 1447    Subjective I feel ok today.    Currently in Pain? No/denies   Multiple Pain Sites No                         [facility] Adult PT Treatment/Exercise - 03/28/17 0001      Knee/Hip Exercises: Aerobic   Nustep L2 x10 min  PTA discussed status/review   Other Aerobic UBE standing on teal pods 5 min reverse  Verbal reminders for posture     Knee/Hip Exercises: Standing   Heel Raises Both;1 set;20 reps   Knee Flexion Strengthening;Both;1 set;20 reps   Knee Flexion Limitations 1# added   Hip Abduction Stengthening;Both;1 set;20 reps;Knee straight   Abduction Limitations added 1# wt today   Hip Extension Stengthening;Both;1 set;20 reps;Knee straight   Extension Limitations 1# added   Forward Step Up Both;1 set;10 reps;Hand Hold: 2;Step Height: 6"   Walking with  Sports Cord 15# backwards 10x     Knee/Hip Exercises: Seated   Long Arc Quad Strengthening;Both;2 sets;10 reps;Weights   Long Arc Quad Weight 1 lbs.                PT Education - 03/27/17 1043    Education Details body mechanics with daily tasks   Person(s) Educated Patient   Methods Explanation;Demonstration;Verbal cues;Handout   Comprehension Verbalized understanding;Returned demonstration          PT Short Term Goals - 03/27/17 1050      PT SHORT TERM GOAL #2   Title ability to get in and out of bed with correct body mechanics with pain decreased >/= 25%   Time 4   Period Weeks   Status Achieved     PT SHORT TERM GOAL #3   Title understand correct posture to reduce strain on back and decrease pain   Time 4   Period Weeks   Status Achieved     PT SHORT TERM GOAL #4   Title understand information on osteoporosis and do's and do nots of osteoporosis   Time 4   Period Weeks   Status Achieved           PT Long Term Goals -  03/13/17 1222      PT LONG TERM GOAL #1   Title independent with HEP for postural strength   Time 8   Period Weeks   Status New   Target Date 05/08/17     PT LONG TERM GOAL #2   Title stand erect with head away from wall </= 15 cm   Time 8   Period Weeks   Status New   Target Date 05/08/17     PT LONG TERM GOAL #3   Title perform daily tasks including getting into bed with pain decreased >/= 50% due to improved posture   Time 8   Period Weeks   Status New   Target Date 05/08/17     PT LONG TERM GOAL #4   Title patient reports he walks with increased steadiness >/= 50% due to improve strength and posture   Time 8   Period Weeks   Status New   Target Date 05/08/17     PT LONG TERM GOAL #5   Title foto score limitation </= 25% limitation   Time 8   Period Weeks   Status New   Target Date 05/08/17               Plan - 03/28/17 1448    Clinical Impression Statement Minimal verbal reminders for posture as pt  has increased his  postural awareness and ability to self correct. Added light weights to standing exercises, almost too easy. Pt wil lbe able to do 2# next session.     Rehab Potential Good   Clinical Impairments Affecting Rehab Potential compression fracture 2013; osteoporosis; T11 compression fracture with scoliosis and spondylosis; peripheral neuropathy   PT Frequency 2x / week   PT Duration 8 weeks   PT Treatment/Interventions Electrical Stimulation;Moist Heat;Ultrasound;Therapeutic activities;Therapeutic exercise;Neuromuscular re-education;Patient/family education;Passive range of motion;Manual techniques;Dry needling;Energy conservation   PT Next Visit Plan  continue with postural strength and endurance, balance exercises, add 2# to standing exercises.    Consulted and Agree with Plan of Care Patient      Patient will benefit from skilled therapeutic intervention in order to improve the following deficits and impairments:  Abnormal gait, Decreased range of motion, Difficulty walking, Increased fascial restricitons, Decreased activity tolerance, Decreased endurance, Pain, Decreased mobility, Decreased strength  Visit Diagnosis: Muscle weakness (generalized)  Abnormal posture  Other abnormalities of gait and mobility     Problem List Patient Active Problem List   Diagnosis Date Noted  . Barrett's esophagus 05/20/2013  . HTN (hypertension) 05/20/2013  . GENERALIZED OSTEOARTHROSIS UNSPECIFIED SITE 06/10/2008  . EPIGASTRIC PAIN 06/10/2008  . GASTRIC ULCER, HX OF 06/10/2008  . INTERNAL HEMORRHOIDS 06/08/2008    Ogden Handlin, PTA 03/28/2017, 3:18 PM  [facility] [address] [place], [place], [address] Phone: [phone]   Fax:  [phone]  Name: Cameron Todd MRN: [id number] Date of Birth: August 14, 1932

## 2017-04-02 ENCOUNTER — Ambulatory Visit: Payer: Medicare Other | Admitting: Physical Therapy

## 2017-04-02 ENCOUNTER — Encounter: Payer: Self-pay | Admitting: Physical Therapy

## 2017-04-02 DIAGNOSIS — M6281 Muscle weakness (generalized): Secondary | ICD-10-CM | POA: Diagnosis not present

## 2017-04-02 DIAGNOSIS — R2689 Other abnormalities of gait and mobility: Secondary | ICD-10-CM

## 2017-04-02 DIAGNOSIS — R293 Abnormal posture: Secondary | ICD-10-CM | POA: Diagnosis not present

## 2017-04-02 NOTE — Therapy (Signed)
[facility] [address] [place], [place], [address] Phone: [phone]   Fax:  [phone]  Physical Therapy Treatment  Patient Details  Name: Cameron Todd MRN: [id number] Date of Birth: [birth year] Referring Provider: Dr. Leanna Battles  Encounter Date: 04/02/2017      PT End of Session - 04/02/17 1447    Visit Number 7   Number of Visits 10   Date for PT Re-Evaluation 05/08/17   Authorization Type Medicare g-code on 10th visit   PT Start Time 1447   PT Stop Time 1525   PT Time Calculation (min) 38 min   Activity Tolerance Patient tolerated treatment well   Behavior During Therapy [facility] for tasks assessed/performed      Past Medical History:  Diagnosis Date  . Barrett's esophagus   . Compression fracture of spine ([place])    T 10  . Diabetes ([place])   . Elevated cholesterol   . Gastric ulcer 1990  . History of colon polyps     Past Surgical History:  Procedure Laterality Date  . NO PAST SURGERIES    . UPPER GASTROINTESTINAL ENDOSCOPY      There were no vitals filed for this visit.      Subjective Assessment - 04/02/17 1448    Subjective My pain is no worse, just baseline pain.    Currently in Pain? Yes   Pain Score 1    Pain Location Back   Pain Orientation Lower   Pain Descriptors / Indicators Sore   Aggravating Factors  Sitting too long or standing too long   Pain Relieving Factors brace                         [facility] Adult PT Treatment/Exercise - 04/02/17 0001      Neuro Re-ed    Neuro Re-ed Details  Standing on black  Airex activities  weight shifting, single leg balance,tandem stance with red b     Knee/Hip Exercises: Aerobic   Nustep L2 x10 min  PTA discussed status/review     Knee/Hip Exercises: Standing   Heel Raises Both;1 set;20 reps   Knee Flexion Strengthening;Both;2 sets;10 reps   Knee Flexion Limitations 2# added   Hip Abduction Stengthening;Both;2  sets;10 reps;Knee straight   Abduction Limitations 2# added   Hip Extension Stengthening;Both;2 sets;10 reps;Knee straight   Extension Limitations 2#   Walking with Sports Cord 15# backwards 12x  SBA     Knee/Hip Exercises: Seated   Long Arc Quad Strengthening;Both;2 sets;10 reps;Weights   Long Arc Quad Weight 2 lbs.                  PT Short Term Goals - 03/27/17 1050      PT SHORT TERM GOAL #2   Title ability to get in and out of bed with correct body mechanics with pain decreased >/= 25%   Time 4   Period Weeks   Status Achieved     PT SHORT TERM GOAL #3   Title understand correct posture to reduce strain on back and decrease pain   Time 4   Period Weeks   Status Achieved     PT SHORT TERM GOAL #4   Title understand information on osteoporosis and do's and do nots of osteoporosis   Time 4   Period Weeks   Status Achieved           PT Long Term Goals -  03/13/17 1222      PT LONG TERM GOAL #1   Title independent with HEP for postural strength   Time 8   Period Weeks   Status New   Target Date 05/08/17     PT LONG TERM GOAL #2   Title stand erect with head away from wall </= 15 cm   Time 8   Period Weeks   Status New   Target Date 05/08/17     PT LONG TERM GOAL #3   Title perform daily tasks including getting into bed with pain decreased >/= 50% due to improved posture   Time 8   Period Weeks   Status New   Target Date 05/08/17     PT LONG TERM GOAL #4   Title patient reports he walks with increased steadiness >/= 50% due to improve strength and posture   Time 8   Period Weeks   Status New   Target Date 05/08/17     PT LONG TERM GOAL #5   Title foto score limitation </= 25% limitation   Time 8   Period Weeks   Status New   Target Date 05/08/17               Plan - 04/02/17 1448    Clinical Impression Statement Pt standing more erect at this time. His biggest complaint at this time is his balance.    Rehab Potential Good    Clinical Impairments Affecting Rehab Potential compression fracture 2013; osteoporosis; T11 compression fracture with scoliosis and spondylosis; peripheral neuropathy   PT Frequency 2x / week   PT Duration 8 weeks   PT Treatment/Interventions Electrical Stimulation;Moist Heat;Ultrasound;Therapeutic activities;Therapeutic exercise;Neuromuscular re-education;Patient/family education;Passive range of motion;Manual techniques;Dry needling;Energy conservation   PT Next Visit Plan  continue with postural strength and endurance & balance exercises, continue with  2# for  standing exercises.    Consulted and Agree with Plan of Care Patient      Patient will benefit from skilled therapeutic intervention in order to improve the following deficits and impairments:  Abnormal gait, Decreased range of motion, Difficulty walking, Increased fascial restricitons, Decreased activity tolerance, Decreased endurance, Pain, Decreased mobility, Decreased strength  Visit Diagnosis: Muscle weakness (generalized)  Abnormal posture  Other abnormalities of gait and mobility     Problem List Patient Active Problem List   Diagnosis Date Noted  . Barrett's esophagus 05/20/2013  . HTN (hypertension) 05/20/2013  . GENERALIZED OSTEOARTHROSIS UNSPECIFIED SITE 06/10/2008  . EPIGASTRIC PAIN 06/10/2008  . GASTRIC ULCER, HX OF 06/10/2008  . INTERNAL HEMORRHOIDS 06/08/2008    Tationna Fullard, PTA 04/02/2017, 3:30 PM   [facility] [address] [place], [place], [address] Phone: [phone]   Fax:  [phone]  Name: PEDER ALLUMS MRN: [id number] Date of Birth: [birth year]

## 2017-04-04 ENCOUNTER — Ambulatory Visit: Payer: Medicare Other | Admitting: Physical Therapy

## 2017-04-04 ENCOUNTER — Encounter: Payer: Self-pay | Admitting: Physical Therapy

## 2017-04-04 DIAGNOSIS — M6281 Muscle weakness (generalized): Secondary | ICD-10-CM

## 2017-04-04 DIAGNOSIS — R2689 Other abnormalities of gait and mobility: Secondary | ICD-10-CM

## 2017-04-04 DIAGNOSIS — R293 Abnormal posture: Secondary | ICD-10-CM

## 2017-04-04 NOTE — Therapy (Signed)
[facility] [address] [place], [place], [address] Phone: [phone]   Fax:  [phone]  Physical Therapy Treatment  Patient Details  Name: Cameron Todd MRN: [id number] Date of Birth: 1933-06-10 Referring Provider: Dr. Leanna Battles  Encounter Date: 04/04/2017      PT End of Session - 04/04/17 1451    Visit Number 8   Number of Visits 10   Date for PT Re-Evaluation 05/08/17   Authorization Type Medicare g-code on 10th visit   PT Start Time 1445   PT Stop Time 1530   PT Time Calculation (min) 45 min   Activity Tolerance Patient tolerated treatment well   Behavior During Therapy [facility] for tasks assessed/performed      Past Medical History:  Diagnosis Date  . Barrett's esophagus   . Compression fracture of spine ([place])    T 10  . Diabetes ([place])   . Elevated cholesterol   . Gastric ulcer 1990  . History of colon polyps     Past Surgical History:  Procedure Laterality Date  . NO PAST SURGERIES    . UPPER GASTROINTESTINAL ENDOSCOPY      There were no vitals filed for this visit.      Subjective Assessment - 04/04/17 1454    Subjective I do not feel normal when I walk.    Currently in Pain? No/denies   Multiple Pain Sites No                         [facility] Adult PT Treatment/Exercise - 04/04/17 0001      Neuro Re-ed    Neuro Re-ed Details  Standing on black  Airex activities  weight shifting, single leg balance,tandem stance with red b     Knee/Hip Exercises: Aerobic   Nustep L3 x 10 min   Other Aerobic UBE standing on teal pods 5 min reverse  Verbal reminders for posture     Knee/Hip Exercises: Standing   Heel Raises Both;2 sets;20 reps   Knee Flexion Strengthening;Both;2 sets;10 reps;20 reps   Knee Flexion Limitations 2#   Hip Abduction Stengthening;Both;2 sets;20 reps;Knee straight   Abduction Limitations 2# 2 sets    Hip Extension Stengthening;Both;2 sets;10 reps;20  reps;Knee straight   Extension Limitations 2# 2 sets                PT Education - 04/04/17 1523    Education provided Yes   Education Details HEP advancement: standing red bands pulls in tandem stance: issiused band   Person(s) Educated Patient   Methods Explanation;Demonstration;Tactile cues;Verbal cues   Comprehension Verbalized understanding;Returned demonstration          PT Short Term Goals - 03/27/17 1050      PT SHORT TERM GOAL #2   Title ability to get in and out of bed with correct body mechanics with pain decreased >/= 25%   Time 4   Period Weeks   Status Achieved     PT SHORT TERM GOAL #3   Title understand correct posture to reduce strain on back and decrease pain   Time 4   Period Weeks   Status Achieved     PT SHORT TERM GOAL #4   Title understand information on osteoporosis and do's and do nots of osteoporosis   Time 4   Period Weeks   Status Achieved           PT Long Term Goals -  04/04/17 1452      PT LONG TERM GOAL #3   Title perform daily tasks including getting into bed with pain decreased >/= 50% due to improved posture   Time 8   Period Weeks   Status Achieved  50%     PT LONG TERM GOAL #4   Title patient reports he walks with increased steadiness >/= 50% due to improve strength and posture   Time 8   Period Weeks   Status On-going  10%-20%               Plan - 04/04/17 1451    Clinical Impression Statement Pt reports getting in bed is now 50% less painful, meeting LTG. We added red band pulls in tandem stance to his HEP to advance his balance HEP today., otherwise pt is progressing well each week.    Rehab Potential Good   Clinical Impairments Affecting Rehab Potential compression fracture 2013; osteoporosis; T11 compression fracture with scoliosis and spondylosis; peripheral neuropathy   PT Frequency 2x / week   PT Duration 8 weeks   PT Treatment/Interventions Electrical Stimulation;Moist  Heat;Ultrasound;Therapeutic activities;Therapeutic exercise;Neuromuscular re-education;Patient/family education;Passive range of motion;Manual techniques;Dry needling;Energy conservation   PT Next Visit Plan  continue with postural strength and endurance & balance exercises, continue with  2# for  standing exercises.       Patient will benefit from skilled therapeutic intervention in order to improve the following deficits and impairments:  Abnormal gait, Decreased range of motion, Difficulty walking, Increased fascial restricitons, Decreased activity tolerance, Decreased endurance, Pain, Decreased mobility, Decreased strength  Visit Diagnosis: Muscle weakness (generalized)  Abnormal posture  Other abnormalities of gait and mobility     Problem List Patient Active Problem List   Diagnosis Date Noted  . Barrett's esophagus 05/20/2013  . HTN (hypertension) 05/20/2013  . GENERALIZED OSTEOARTHROSIS UNSPECIFIED SITE 06/10/2008  . EPIGASTRIC PAIN 06/10/2008  . GASTRIC ULCER, HX OF 06/10/2008  . INTERNAL HEMORRHOIDS 06/08/2008    Cameron Todd , PTA 04/04/2017, 3:23 PM  [facility] [address] [place], [place], [address] Phone: [phone]   Fax:  [phone]  Name: Cameron Todd MRN: [id number] Date of Birth: May 15, 1933

## 2017-04-09 ENCOUNTER — Encounter: Payer: Self-pay | Admitting: Physical Therapy

## 2017-04-09 ENCOUNTER — Ambulatory Visit: Payer: Medicare Other | Admitting: Physical Therapy

## 2017-04-09 DIAGNOSIS — R293 Abnormal posture: Secondary | ICD-10-CM | POA: Diagnosis not present

## 2017-04-09 DIAGNOSIS — M6281 Muscle weakness (generalized): Secondary | ICD-10-CM | POA: Diagnosis not present

## 2017-04-09 DIAGNOSIS — R2689 Other abnormalities of gait and mobility: Secondary | ICD-10-CM | POA: Diagnosis not present

## 2017-04-09 NOTE — Therapy (Deleted)
[facility] [address], [place], [place], [address] Phone: [phone]   Fax:  [phone]  Physical Therapy Treatment  Patient Details  Name: Cameron Todd MRN: [id number] Date of Birth: 07/08/1933 Referring Provider: Dr. Leanna Battles  Encounter Date: 04/09/2017      PT End of Session - 04/09/17 1443    Visit Number 9   Number of Visits 10   Date for PT Re-Evaluation 05/08/17   Authorization Type Medicare g-code on 10th visit   PT Start Time 1444   PT Stop Time 1530   PT Time Calculation (min) 46 min   Activity Tolerance Patient tolerated treatment well   Behavior During Therapy [facility] for tasks assessed/performed      Past Medical History:  Diagnosis Date  . Barrett's esophagus   . Compression fracture of spine ([place])    T 10  . Diabetes ([place])   . Elevated cholesterol   . Gastric ulcer 1990  . History of colon polyps     Past Surgical History:  Procedure Laterality Date  . NO PAST SURGERIES    . UPPER GASTROINTESTINAL ENDOSCOPY      There were no vitals filed for this visit.      Subjective Assessment - 04/09/17 1444    Subjective Still fell wobbly when I walk.    Currently in Pain? Yes   Pain Score 4    Pain Location Back   Pain Orientation Right   Pain Descriptors / Indicators Sore   Aggravating Factors  Not sure   Pain Relieving Factors Brace   Multiple Pain Sites No                         [facility] Adult PT Treatment/Exercise - 04/09/17 0001      Knee/Hip Exercises: Aerobic   Nustep L3 x 10 min  review of status and goals     Knee/Hip Exercises: Standing   Heel Raises Both;2 sets;20 reps   Knee Flexion Strengthening;Both;2 sets;10 reps;20 reps   Knee Flexion Limitations 2#   Hip Abduction Stengthening;Both;2 sets;20 reps;Knee straight   Abduction Limitations 2# 2 sets    Hip Extension Stengthening;Both;2 sets;10 reps;20 reps;Knee straight   Extension  Limitations 2# 2 sets   Lateral Step Up Both;2 sets;15 reps;Hand Hold: 1;Step Height: 6"   Walking with Sports Cord 20# backwards 12x  SBA     Knee/Hip Exercises: Seated   Long Arc Quad Strengthening;Both;2 sets;10 reps;Weights   Long Arc Quad Weight 3 lbs.     Shoulder Exercises: Supine   Other Supine Exercises 2 mn laying supine with shld ext rotated  and only bunching the top pillow     Shoulder Exercises: Seated   Other Seated Exercises Stand with large arm circles 10x  VC to add some thoracic ext                  PT Short Term Goals - 03/27/17 1050      PT SHORT TERM GOAL #2   Title ability to get in and out of bed with correct body mechanics with pain decreased >/= 25%   Time 4   Period Weeks   Status Achieved     PT SHORT TERM GOAL #3   Title understand correct posture to reduce strain on back and decrease pain   Time 4   Period Weeks   Status Achieved     PT SHORT TERM GOAL #  4   Title understand information on osteoporosis and do's and do nots of osteoporosis   Time 4   Period Weeks   Status Achieved           PT Long Term Goals - 04/09/17 1449      PT LONG TERM GOAL #4   Title patient reports he walks with increased steadiness >/= 50% due to improve strength and posture   Time 8   Period Weeks   Status On-going               Plan - 04/09/17 1451    PT Next Visit Plan  continue with postural strength and endurance & balance exercises, continue with  2# for  standing exercises.       Patient will benefit from skilled therapeutic intervention in order to improve the following deficits and impairments:  Abnormal gait, Decreased range of motion, Difficulty walking, Increased fascial restricitons, Decreased activity tolerance, Decreased endurance, Pain, Decreased mobility, Decreased strength  Visit Diagnosis: Muscle weakness (generalized)  Abnormal posture  Other abnormalities of gait and mobility     Problem List Patient  Active Problem List   Diagnosis Date Noted  . Barrett's esophagus 05/20/2013  . HTN (hypertension) 05/20/2013  . GENERALIZED OSTEOARTHROSIS UNSPECIFIED SITE 06/10/2008  . EPIGASTRIC PAIN 06/10/2008  . GASTRIC ULCER, HX OF 06/10/2008  . INTERNAL HEMORRHOIDS 06/08/2008    Cameron Todd 04/09/2017, 3:30 PM  [facility] [address], [place], [place], [address] Phone: [phone]   Fax:  [phone]  Name: Cameron Todd MRN: [id number] Date of Birth: 15-Jun-1933

## 2017-04-09 NOTE — Therapy (Signed)
[facility] [address], [place], [place], [address] Phone: [phone]   Fax:  [phone]  Physical Therapy Treatment  Patient Details  Name: Cameron Todd MRN: [id number] Date of Birth: [birth year] Referring Provider: Dr. Leanna Battles  Encounter Date: 04/09/2017      PT End of Session - 04/09/17 1443    Visit Number 9   Number of Visits 10   Date for PT Re-Evaluation 05/08/17   Authorization Type Medicare g-code on 10th visit   PT Start Time 1444   PT Stop Time 1530   PT Time Calculation (min) 46 min   Activity Tolerance Patient tolerated treatment well   Behavior During Therapy [facility] for tasks assessed/performed      Past Medical History:  Diagnosis Date  . Barrett's esophagus   . Compression fracture of spine ([place])    T 10  . Diabetes ([place])   . Elevated cholesterol   . Gastric ulcer 1990  . History of colon polyps     Past Surgical History:  Procedure Laterality Date  . NO PAST SURGERIES    . UPPER GASTROINTESTINAL ENDOSCOPY      There were no vitals filed for this visit.      Subjective Assessment - 04/09/17 1444    Subjective Still fell wobbly when I walk.    Currently in Pain? Yes   Pain Score 4    Pain Location Back   Pain Orientation Right   Pain Descriptors / Indicators Sore   Aggravating Factors  Not sure   Pain Relieving Factors Brace   Multiple Pain Sites No                         [facility] Adult PT Treatment/Exercise - 04/09/17 0001      Knee/Hip Exercises: Aerobic   Nustep L3 x 10 min  review of status and goals     Knee/Hip Exercises: Standing   Heel Raises Both;2 sets;20 reps   Knee Flexion Strengthening;Both;2 sets;10 reps;20 reps   Knee Flexion Limitations 2#   Hip Abduction Stengthening;Both;2 sets;20 reps;Knee straight   Abduction Limitations 2# 2 sets    Hip Extension Stengthening;Both;2 sets;10 reps;20 reps;Knee straight   Extension  Limitations 2# 2 sets   Lateral Step Up Both;2 sets;15 reps;Hand Hold: 1;Step Height: 6"   Walking with Sports Cord 20# backwards 12x  SBA     Knee/Hip Exercises: Seated   Long Arc Quad Strengthening;Both;2 sets;10 reps;Weights   Long Arc Quad Weight 3 lbs.     Shoulder Exercises: Supine   Other Supine Exercises 2 mn laying supine with shld ext rotated  and only bunching the top pillow     Shoulder Exercises: Seated   Other Seated Exercises Stand with large arm circles 10x  VC to add some thoracic ext                  PT Short Term Goals - 03/27/17 1050      PT SHORT TERM GOAL #2   Title ability to get in and out of bed with correct body mechanics with pain decreased >/= 25%   Time 4   Period Weeks   Status Achieved     PT SHORT TERM GOAL #3   Title understand correct posture to reduce strain on back and decrease pain   Time 4   Period Weeks   Status Achieved     PT SHORT TERM GOAL #  4   Title understand information on osteoporosis and do's and do nots of osteoporosis   Time 4   Period Weeks   Status Achieved           PT Long Term Goals - 04/09/17 1449      PT LONG TERM GOAL #4   Title patient reports he walks with increased steadiness >/= 50% due to improve strength and posture   Time 8   Period Weeks   Status On-going               Plan - 04/09/17 1451    Rehab Potential Good   Clinical Impairments Affecting Rehab Potential compression fracture 2013; osteoporosis; T11 compression fracture with scoliosis and spondylosis; peripheral neuropathy   PT Frequency 2x / week   PT Duration 8 weeks   PT Treatment/Interventions Electrical Stimulation;Moist Heat;Ultrasound;Therapeutic activities;Therapeutic exercise;Neuromuscular re-education;Patient/family education;Passive range of motion;Manual techniques;Dry needling;Energy conservation   PT Next Visit Plan  continue with postural strength and endurance & balance exercises, continue with  2# for   standing exercises.    Consulted and Agree with Plan of Care Patient      Patient will benefit from skilled therapeutic intervention in order to improve the following deficits and impairments:  Abnormal gait, Decreased range of motion, Difficulty walking, Increased fascial restricitons, Decreased activity tolerance, Decreased endurance, Pain, Decreased mobility, Decreased strength  Visit Diagnosis: Muscle weakness (generalized)  Abnormal posture  Other abnormalities of gait and mobility     Problem List Patient Active Problem List   Diagnosis Date Noted  . Barrett's esophagus 05/20/2013  . HTN (hypertension) 05/20/2013  . GENERALIZED OSTEOARTHROSIS UNSPECIFIED SITE 06/10/2008  . EPIGASTRIC PAIN 06/10/2008  . GASTRIC ULCER, HX OF 06/10/2008  . INTERNAL HEMORRHOIDS 06/08/2008    COCHRAN,JENNIFER, PTA 04/09/2017, 3:33 PM  [place] [address] [place], [place], [address] Phone: [phone]   Fax:  [phone]  Name: Cameron Todd MRN: [id number] Date of Birth: [birth year]

## 2017-04-11 ENCOUNTER — Ambulatory Visit: Payer: Medicare Other | Admitting: Physical Therapy

## 2017-04-11 ENCOUNTER — Encounter: Payer: Self-pay | Admitting: Physical Therapy

## 2017-04-11 DIAGNOSIS — R2689 Other abnormalities of gait and mobility: Secondary | ICD-10-CM

## 2017-04-11 DIAGNOSIS — R293 Abnormal posture: Secondary | ICD-10-CM

## 2017-04-11 DIAGNOSIS — M6281 Muscle weakness (generalized): Secondary | ICD-10-CM

## 2017-04-11 NOTE — Therapy (Addendum)
[facility] [address], [place], [place], [address] Phone: [phone]   Fax:  [phone]  Physical Therapy Treatment  Patient Details  Name: Cameron Todd MRN: [id number] Date of Birth: [birth year] Referring Provider: Dr. Leanna Battles  Encounter Date: 04/11/2017      PT End of Session - 04/11/17 1452    Visit Number 10   Number of Visits 10   Date for PT Re-Evaluation 05/08/17   PT Start Time 6644   PT Stop Time 1540   PT Time Calculation (min) 53 min   Activity Tolerance Patient tolerated treatment well;Patient limited by pain   Behavior During Therapy [facility] for tasks assessed/performed      Past Medical History:  Diagnosis Date  . Barrett's esophagus   . Compression fracture of spine ([place])    T 10  . Diabetes ([place])   . Elevated cholesterol   . Gastric ulcer 1990  . History of colon polyps     Past Surgical History:  Procedure Laterality Date  . NO PAST SURGERIES    . UPPER GASTROINTESTINAL ENDOSCOPY      There were no vitals filed for this visit.      Subjective Assessment - 04/11/17 1450    Subjective My back feels more sore today and I am not sure why. And my abdomin feels very tight.    Currently in Pain? Yes   Pain Score 4    Pain Location Thoracic   Pain Orientation Right   Pain Descriptors / Indicators Sore   Multiple Pain Sites No            [facility] PT Assessment - 04/11/17 0001      Observation/Other Assessments   Focus on Therapeutic Outcomes (FOTO)  37% limitation CJ all categories   Last score was better at 31%       g-code: functional assessment tool; FOTO score is 37% limitation; Functional limitation other PT primary; PT primary Current status CJ; goal status CJ.  Earlie Counts, PT 04/12/17 10:54 AM                 [facility] Adult PT Treatment/Exercise - 04/11/17 0001      Knee/Hip Exercises: Aerobic   Nustep L3 x 10 min  review of status and goals     Shoulder Exercises: Supine   Other Supine Exercises Semireclined: red band unattached series 2x15 bil     Shoulder Exercises: Stretch   Other Shoulder Stretches Shoulder press 3 sec 10x   Other Shoulder Stretches pec stretch 10x AROM, then 3x 15 sec     Moist Heat Therapy   Number Minutes Moist Heat 15 Minutes   Moist Heat Location --  Rt lower thoracic     Electrical Stimulation   Electrical Stimulation Location RT lower thoracic   Electrical Stimulation Action IFC in semireclined position   Electrical Stimulation Parameters 80-150 HZ to tolerance   Electrical Stimulation Goals Pain                  PT Short Term Goals - 03/27/17 1050      PT SHORT TERM GOAL #2   Title ability to get in and out of bed with correct body mechanics with pain decreased >/= 25%   Time 4   Period Weeks   Status Achieved     PT SHORT TERM GOAL #3   Title understand correct posture to reduce strain on back and decrease pain  Time 4   Period Weeks   Status Achieved     PT SHORT TERM GOAL #4   Title understand information on osteoporosis and do's and do nots of osteoporosis   Time 4   Period Weeks   Status Achieved           PT Long Term Goals - 04/11/17 1456      PT LONG TERM GOAL #1   Title independent with HEP for postural strength   Time 8   Period Weeks   Status On-going     PT LONG TERM GOAL #4   Title patient reports he walks with increased steadiness >/= 50% due to improve strength and posture   Time 8   Period Weeks   Status On-going  10%-15%               Plan - 04/11/17 1452    Clinical Impression Statement Pt presented today with more back pain than he had been having. This pain was distracting and possibly bothering the pt more than he was letting on. We kept exercises to a semirecline dposition so he could have support to his back. We also tried eStim to help reduce his pain.    Rehab Potential Good   Clinical Impairments Affecting Rehab  Potential compression fracture 2013; osteoporosis; T11 compression fracture with scoliosis and spondylosis; peripheral neuropathy   PT Frequency 2x / week   PT Duration 8 weeks   PT Treatment/Interventions Electrical Stimulation;Moist Heat;Ultrasound;Therapeutic activities;Therapeutic exercise;Neuromuscular re-education;Patient/family education;Passive range of motion;Manual techniques;Dry needling;Energy conservation   PT Next Visit Plan Asses pain, conitnue with postural strength and balance.    Consulted and Agree with Plan of Care Patient      Patient will benefit from skilled therapeutic intervention in order to improve the following deficits and impairments:  Abnormal gait, Decreased range of motion, Difficulty walking, Increased fascial restricitons, Decreased activity tolerance, Decreased endurance, Pain, Decreased mobility, Decreased strength  Visit Diagnosis: Muscle weakness (generalized)  Abnormal posture  Other abnormalities of gait and mobility     Problem List Patient Active Problem List   Diagnosis Date Noted  . Barrett's esophagus 05/20/2013  . HTN (hypertension) 05/20/2013  . GENERALIZED OSTEOARTHROSIS UNSPECIFIED SITE 06/10/2008  . EPIGASTRIC PAIN 06/10/2008  . GASTRIC ULCER, HX OF 06/10/2008  . INTERNAL HEMORRHOIDS 06/08/2008    Adiba Fargnoli, PTA 04/11/2017, 3:27 PM  [facility] [address] [place], [place], [address] Phone: [phone]   Fax:  [phone]  Name: MAKAR SLATTER MRN: [id number] Date of Birth: [birth year]

## 2017-04-16 ENCOUNTER — Encounter: Payer: Medicare Other | Admitting: Physical Therapy

## 2017-04-17 ENCOUNTER — Encounter: Payer: Medicare Other | Admitting: Physical Therapy

## 2017-04-17 DIAGNOSIS — N183 Chronic kidney disease, stage 3 (moderate): Secondary | ICD-10-CM | POA: Diagnosis not present

## 2017-04-17 DIAGNOSIS — I10 Essential (primary) hypertension: Secondary | ICD-10-CM | POA: Diagnosis not present

## 2017-04-17 DIAGNOSIS — Z23 Encounter for immunization: Secondary | ICD-10-CM | POA: Diagnosis not present

## 2017-04-17 DIAGNOSIS — K2270 Barrett's esophagus without dysplasia: Secondary | ICD-10-CM | POA: Diagnosis not present

## 2017-04-17 DIAGNOSIS — Z6829 Body mass index (BMI) 29.0-29.9, adult: Secondary | ICD-10-CM | POA: Diagnosis not present

## 2017-04-17 DIAGNOSIS — E784 Other hyperlipidemia: Secondary | ICD-10-CM | POA: Diagnosis not present

## 2017-04-17 DIAGNOSIS — G2581 Restless legs syndrome: Secondary | ICD-10-CM | POA: Diagnosis not present

## 2017-04-17 DIAGNOSIS — M810 Age-related osteoporosis without current pathological fracture: Secondary | ICD-10-CM | POA: Diagnosis not present

## 2017-04-17 DIAGNOSIS — G6289 Other specified polyneuropathies: Secondary | ICD-10-CM | POA: Diagnosis not present

## 2017-04-18 ENCOUNTER — Encounter: Payer: Self-pay | Admitting: Physical Therapy

## 2017-04-18 ENCOUNTER — Ambulatory Visit: Payer: Medicare Other | Admitting: Physical Therapy

## 2017-04-18 DIAGNOSIS — M6281 Muscle weakness (generalized): Secondary | ICD-10-CM | POA: Diagnosis not present

## 2017-04-18 DIAGNOSIS — R2689 Other abnormalities of gait and mobility: Secondary | ICD-10-CM | POA: Diagnosis not present

## 2017-04-18 DIAGNOSIS — R293 Abnormal posture: Secondary | ICD-10-CM | POA: Diagnosis not present

## 2017-04-18 NOTE — Therapy (Signed)
[facility] [address], [place], [place], [address] Phone: [phone]   Fax:  [phone]  Physical Therapy Treatment  Patient Details  Name: Cameron Todd MRN: [id number] Date of Birth: 1933-06-24 Referring Provider: Dr. Leanna Battles  Encounter Date: 04/18/2017      PT End of Session - 04/18/17 1443    Visit Number 11   Number of Visits 20   Date for PT Re-Evaluation 05/08/17   Authorization Type Medicare g-code on 10th visit   PT Start Time 1440   PT Stop Time 1545   PT Time Calculation (min) 65 min   Activity Tolerance Patient tolerated treatment well   Behavior During Therapy [facility] for tasks assessed/performed      Past Medical History:  Diagnosis Date  . Barrett's esophagus   . Compression fracture of spine ([place])    T 10  . Diabetes ([place])   . Elevated cholesterol   . Gastric ulcer 1990  . History of colon polyps     Past Surgical History:  Procedure Laterality Date  . NO PAST SURGERIES    . UPPER GASTROINTESTINAL ENDOSCOPY      There were no vitals filed for this visit.      Subjective Assessment - 04/18/17 1445    Subjective Back feels better than last session. Got a new brace from my family MD. I have been instructed to wear this at home only.    Currently in Pain? Yes   Pain Score 4    Pain Location Thoracic   Pain Orientation Mid   Pain Descriptors / Indicators Sore   Pain Relieving Factors Brace, estim   Effect of Pain on Daily Activities Overdoing activity   Multiple Pain Sites No                         [facility] Adult PT Treatment/Exercise - 04/18/17 0001      High Level Balance   High Level Balance Comments Floor ladder balance activities light CGA     Knee/Hip Exercises: Aerobic   Nustep L3 x 10 min  review of status and goals     Knee/Hip Exercises: Standing   Heel Raises Both;1 set;20 reps   Knee Flexion Strengthening;Both;2 sets;10 reps;20 reps   Knee Flexion Limitations 2.5# added   Hip Abduction Stengthening;Both;2 sets;20 reps;Knee straight   Abduction Limitations 2.5# added   Hip Extension Stengthening;Both;2 sets;10 reps;20 reps;Knee straight   Extension Limitations 2.5# 2 sets   Lateral Step Up Both;2 sets;15 reps;Hand Hold: 1;Step Height: 6"   Rebounder weight shifting 1 min 3 ways no UE      Knee/Hip Exercises: Seated   Long Arc Quad Strengthening;Both;2 sets;10 reps;Weights   Long Arc Quad Weight 4 lbs.     Shoulder Exercises: Seated   Row Strengthening;Both;15 reps;Theraband   Theraband Level (Shoulder Row) Level 3 (Green)   Row Weight (lbs) 2x 15   Horizontal ABduction Strengthening;Both;Theraband   Theraband Level (Shoulder Horizontal ABduction) Level 2 (Red)   Horizontal ABduction Weight (lbs) 3x10   Horizontal ABduction Limitations Diagonols bil 2x 15 red      Moist Heat Therapy   Number Minutes Moist Heat 15 Minutes   Moist Heat Location --  Rt lower thoracic     Electrical Stimulation   Electrical Stimulation Location RT lower thoracic   Electrical Stimulation Action IFC in semireclined   Electrical Stimulation Parameters 80-150 HZ   Electrical Stimulation Goals Pain  PT Short Term Goals - 03/27/17 1050      PT SHORT TERM GOAL #2   Title ability to get in and out of bed with correct body mechanics with pain decreased >/= 25%   Time 4   Period Weeks   Status Achieved     PT SHORT TERM GOAL #3   Title understand correct posture to reduce strain on back and decrease pain   Time 4   Period Weeks   Status Achieved     PT SHORT TERM GOAL #4   Title understand information on osteoporosis and do's and do nots of osteoporosis   Time 4   Period Weeks   Status Achieved           PT Long Term Goals - 04/11/17 1456      PT LONG TERM GOAL #1   Title independent with HEP for postural strength   Time 8   Period Weeks   Status On-going     PT LONG TERM GOAL #4    Title patient reports he walks with increased steadiness >/= 50% due to improve strength and posture   Time 8   Period Weeks   Status On-going  10%-15%               Plan - 04/18/17 1443    Clinical Impression Statement Back pain was better today and did not interfere with treatments. Pt was able to participate in more advanced dynamic balance exercises without any signs of unsteadiness. Weights were increased for continued LE strengthening. Tolerated this fine.    Rehab Potential Good   Clinical Impairments Affecting Rehab Potential compression fracture 2013; osteoporosis; T11 compression fracture with scoliosis and spondylosis; peripheral neuropathy   PT Frequency 2x / week   PT Next Visit Plan Dynamic balance, postural strength, LE strength. Pt likes Estim at end for pain.    Consulted and Agree with Plan of Care --      Patient will benefit from skilled therapeutic intervention in order to improve the following deficits and impairments:  Abnormal gait, Decreased range of motion, Difficulty walking, Increased fascial restricitons, Decreased activity tolerance, Decreased endurance, Pain, Decreased mobility, Decreased strength  Visit Diagnosis: Muscle weakness (generalized)  Abnormal posture  Other abnormalities of gait and mobility     Problem List Patient Active Problem List   Diagnosis Date Noted  . Barrett's esophagus 05/20/2013  . HTN (hypertension) 05/20/2013  . GENERALIZED OSTEOARTHROSIS UNSPECIFIED SITE 06/10/2008  . EPIGASTRIC PAIN 06/10/2008  . GASTRIC ULCER, HX OF 06/10/2008  . INTERNAL HEMORRHOIDS 06/08/2008    Ashawnti Tangen, PTA 04/18/2017, 3:35 PM  [facility] [address] [place], [place], [address] Phone: [phone]   Fax:  [phone]  Name: Cameron Todd MRN: [id number] Date of Birth: 17-Nov-1932

## 2017-04-20 ENCOUNTER — Ambulatory Visit (HOSPITAL_COMMUNITY)
Admission: RE | Admit: 2017-04-20 | Discharge: 2017-04-20 | Disposition: A | Discharge status: Home / Self Care | Payer: Medicare Other | Source: Ambulatory Visit | Attending: Internal Medicine | Admitting: Internal Medicine

## 2017-04-20 ENCOUNTER — Ambulatory Visit: Payer: Medicare Other | Admitting: Physical Therapy

## 2017-04-20 ENCOUNTER — Encounter: Payer: Self-pay | Admitting: Physical Therapy

## 2017-04-20 DIAGNOSIS — R2689 Other abnormalities of gait and mobility: Secondary | ICD-10-CM

## 2017-04-20 DIAGNOSIS — R293 Abnormal posture: Secondary | ICD-10-CM | POA: Diagnosis not present

## 2017-04-20 DIAGNOSIS — M6281 Muscle weakness (generalized): Secondary | ICD-10-CM

## 2017-04-20 NOTE — Therapy (Signed)
[facility] [address], [place], [place], [address] Phone: [phone]   Fax:  [phone]  Physical Therapy Treatment  Patient Details  Name: Cameron Todd MRN: [id number] Date of Birth: 01/22/1933 Referring Provider: Dr. Leanna Battles  Encounter Date: 04/20/2017      PT End of Session - 04/20/17 1021    Visit Number 12   Number of Visits 20   Date for PT Re-Evaluation 05/08/17   Authorization Type Medicare g-code on 10th visit   PT Start Time 1021   PT Stop Time 1115   PT Time Calculation (min) 54 min   Activity Tolerance Patient tolerated treatment well   Behavior During Therapy [facility] for tasks assessed/performed      Past Medical History:  Diagnosis Date  . Barrett's esophagus   . Compression fracture of spine ([place])    T 10  . Diabetes ([place])   . Elevated cholesterol   . Gastric ulcer 1990  . History of colon polyps     Past Surgical History:  Procedure Laterality Date  . NO PAST SURGERIES    . UPPER GASTROINTESTINAL ENDOSCOPY      There were no vitals filed for this visit.      Subjective Assessment - 04/20/17 1024    Subjective My pain is better today.    Currently in Pain? Yes   Pain Score 2    Pain Location Thoracic   Pain Orientation Mid   Pain Descriptors / Indicators Dull   Multiple Pain Sites No                         [facility] Adult PT Treatment/Exercise - 04/20/17 0001      High Level Balance   High Level Balance Activities --  Standing on black foam/ alt toe taps on step 2x10,    High Level Balance Comments Floor ladder balance activities light CGA  climb stairs without UE up & down 2x     Knee/Hip Exercises: Aerobic   Stationary Bike L1 x 10 min  Pt monitored for status     Knee/Hip Exercises: Standing   Heel Raises Both;1 set;20 reps   Knee Flexion Strengthening;Both;2 sets;10 reps;20 reps   Knee Flexion Limitations 2.5# added   Hip Abduction  Stengthening;Both;2 sets;20 reps;Knee straight   Abduction Limitations 2.5# added   Hip Extension Stengthening;Both;2 sets;10 reps;20 reps;Knee straight   Extension Limitations 2.5# 2 sets   Lateral Step Up Both;2 sets;15 reps;Hand Hold: 1;Step Height: 6"   Rebounder weight shifting 1 min 3 ways no UE      Knee/Hip Exercises: Seated   Long Arc Quad Strengthening;Both;2 sets;10 reps;Weights   Long Arc Quad Weight 4 lbs.     Moist Heat Therapy   Number Minutes Moist Heat 15 Minutes   Moist Heat Location --  Rt lower thoracic     Electrical Stimulation   Electrical Stimulation Location RT lower thoracic   Electrical Stimulation Action IFC in semireclined    Electrical Stimulation Parameters 80-150 HZ   Electrical Stimulation Goals Pain                  PT Short Term Goals - 03/27/17 1050      PT SHORT TERM GOAL #2   Title ability to get in and out of bed with correct body mechanics with pain decreased >/= 25%   Time 4   Period Weeks   Status Achieved  PT SHORT TERM GOAL #3   Title understand correct posture to reduce strain on back and decrease pain   Time 4   Period Weeks   Status Achieved     PT SHORT TERM GOAL #4   Title understand information on osteoporosis and do's and do nots of osteoporosis   Time 4   Period Weeks   Status Achieved           PT Long Term Goals - 04/20/17 1056      PT LONG TERM GOAL #4   Title patient reports he walks with increased steadiness >/= 50% due to improve strength and posture   Time 8   Period Weeks   Status On-going               Plan - 04/20/17 1021    Clinical Impression Statement Pt reports he still feels he waddles when he walks resluting in a feeling of unsteadiness. This goal is unchanged at this time despite no evidence of unsteadiness during his balance exercises when in the clinic.     Rehab Potential Good   Clinical Impairments Affecting Rehab Potential compression fracture 2013; osteoporosis;  T11 compression fracture with scoliosis and spondylosis; peripheral neuropathy   PT Frequency 2x / week   PT Duration 8 weeks   PT Treatment/Interventions Electrical Stimulation;Moist Heat;Ultrasound;Therapeutic activities;Therapeutic exercise;Neuromuscular re-education;Patient/family education;Passive range of motion;Manual techniques;Dry needling;Energy conservation   PT Next Visit Plan Pt will be out of town for a bit, then return for re-evaluation. Measure distance head is away from the wall.   Consulted and Agree with Plan of Care --      Patient will benefit from skilled therapeutic intervention in order to improve the following deficits and impairments:  Abnormal gait, Decreased range of motion, Difficulty walking, Increased fascial restricitons, Decreased activity tolerance, Decreased endurance, Pain, Decreased mobility, Decreased strength  Visit Diagnosis: Muscle weakness (generalized)  Abnormal posture  Other abnormalities of gait and mobility     Problem List Patient Active Problem List   Diagnosis Date Noted  . Barrett's esophagus 05/20/2013  . HTN (hypertension) 05/20/2013  . GENERALIZED OSTEOARTHROSIS UNSPECIFIED SITE 06/10/2008  . EPIGASTRIC PAIN 06/10/2008  . GASTRIC ULCER, HX OF 06/10/2008  . INTERNAL HEMORRHOIDS 06/08/2008    Oluwasemilore Pascuzzi, PTA 04/20/2017, 10:57 AM  [facility] [address], [place], [place], [address] Phone: [phone]   Fax:  [phone]  Name: Cameron Todd MRN: [id number] Date of Birth: 1932-10-24

## 2017-04-23 ENCOUNTER — Encounter: Payer: Medicare Other | Admitting: Physical Therapy

## 2017-04-25 ENCOUNTER — Encounter: Payer: Medicare Other | Admitting: Physical Therapy

## 2017-04-30 ENCOUNTER — Encounter: Payer: Medicare Other | Admitting: Physical Therapy

## 2017-05-02 ENCOUNTER — Encounter: Payer: Medicare Other | Admitting: Physical Therapy

## 2017-05-07 ENCOUNTER — Encounter: Payer: Self-pay | Admitting: Physical Therapy

## 2017-05-07 ENCOUNTER — Ambulatory Visit: Payer: Medicare Other | Attending: Internal Medicine | Admitting: Physical Therapy

## 2017-05-07 DIAGNOSIS — M6281 Muscle weakness (generalized): Secondary | ICD-10-CM

## 2017-05-07 DIAGNOSIS — R293 Abnormal posture: Secondary | ICD-10-CM | POA: Insufficient documentation

## 2017-05-07 DIAGNOSIS — R2689 Other abnormalities of gait and mobility: Secondary | ICD-10-CM | POA: Insufficient documentation

## 2017-05-07 NOTE — Therapy (Signed)
[facility] [address], [place], [place], [address] Phone: [phone]   Fax:  [phone]  Physical Therapy Treatment  Patient Details  Name: Cameron Todd MRN: [id number] Date of Birth: 1932/11/05 Referring Provider: Dr. Leanna Battles  Encounter Date: 05/07/2017      PT End of Session - 05/07/17 1447    Visit Number 13   Date for PT Re-Evaluation 07/03/17   Authorization Type Medicare g-code on 10th visit   PT Start Time 1443   PT Stop Time 1545   PT Time Calculation (min) 62 min   Activity Tolerance Patient tolerated treatment well   Behavior During Therapy [facility] for tasks assessed/performed      Past Medical History:  Diagnosis Date  . Barrett's esophagus   . Compression fracture of spine ([place])    T 10  . Diabetes ([place])   . Elevated cholesterol   . Gastric ulcer 1990  . History of colon polyps     Past Surgical History:  Procedure Laterality Date  . NO PAST SURGERIES    . UPPER GASTROINTESTINAL ENDOSCOPY      There were no vitals filed for this visit.          [facility] PT Assessment - 05/07/17 0001      Observation/Other Assessments   Observations stand at wall with measure 21cm with buttocks against wall      Posture/Postural Control   Posture/Postural Control Postural limitations   Postural Limitations Rounded Shoulders;Forward head;Increased thoracic kyphosis;Flexed trunk;Posterior pelvic tilt     PROM   Left Hip Flexion 90   Left Hip ABduction 20     Strength   Right Hip Flexion 5/5   Right Hip Extension 4+/5   Right Hip ABduction 4+/5   Left Hip Flexion 5/5   Left Hip Extension 4+/5   Left Hip ABduction 4+/5     Right Hip   Right Hip Flexion 90   Right Hip ABduction 20     6 minute walk test results    Aerobic Endurance Distance Walked --  815 in 6 min with no assistive device                     [facility] Adult PT Treatment/Exercise - 05/07/17 0001      Knee/Hip Exercises: Aerobic   Nustep L3 x 10 min; seat #6; arm #7  review of status and goals     Knee/Hip Exercises: Seated   Long Arc Quad Strengthening;Both;2 sets;10 reps;Weights   Long Arc Quad Weight 4 lbs.     Modalities   Modalities Electrical Stimulation;Moist Heat     Moist Heat Therapy   Number Minutes Moist Heat 20 Minutes   Moist Heat Location Lumbar Spine  supine     Electrical Stimulation   Electrical Stimulation Location lumbar   Electrical Stimulation Action IFC   Electrical Stimulation Parameters 20 min to patient tolerance,   Electrical Stimulation Goals Pain                  PT Short Term Goals - 03/27/17 1050      PT SHORT TERM GOAL #2   Title ability to get in and out of bed with correct body mechanics with pain decreased >/= 25%   Time 4   Period Weeks   Status Achieved     PT SHORT TERM GOAL #3   Title understand correct posture to reduce strain on back and decrease pain  Time 4   Period Weeks   Status Achieved     PT SHORT TERM GOAL #4   Title understand information on osteoporosis and do's and do nots of osteoporosis   Time 4   Period Weeks   Status Achieved           PT Long Term Goals - 05/07/17 1448      PT LONG TERM GOAL #1   Title independent with HEP for postural strength   Time 8   Period Weeks   Status On-going     PT LONG TERM GOAL #2   Title stand erect with head away from wall </= 15 cm   Time 8   Period Weeks   Status On-going     PT LONG TERM GOAL #3   Title perform daily tasks including getting into bed with pain decreased >/= 50% due to improved posture   Time 8   Period Weeks   Status Achieved     PT LONG TERM GOAL #4   Title patient reports he walks with increased steadiness >/= 50% due to improve strength and posture   Baseline 40% steadier on level and unlevel surface   Time 8   Period Weeks   Status On-going     PT LONG TERM GOAL #5   Title foto score limitation </= 25% limitation    Baseline FOTO score is 43% limitation   Time 8   Period Weeks   Status On-going               Plan - 05/07/17 1525    Clinical Impression Statement Patient has increased strength of bilateral lower extremities.  Patient has missed 2 weeks of therapy due to being out of town visiting his daugher.  Patient can walk for 6 min for 815 feet and the average should be 1400 feet with no fatique.  Patient still stands with flexed posture and able to correct himself but not maintain. Patient still has pain in lumbar thoracic area at 5/10.  Patient does not have to massage his abdominals anymore to lay flat. Patient only feels 40% steadier on his feet when in the community.  Patient only has 90 degrees bilateral hip flexion PROM.  Patient will benefit from skilled therapy to improve postural strength, reduce back pain, increase endurance, and improve postural strength.     Rehab Potential Good   Clinical Impairments Affecting Rehab Potential compression fracture 2013; osteoporosis; T11 compression fracture with scoliosis and spondylosis; peripheral neuropathy   PT Frequency 2x / week   PT Duration 8 weeks   PT Treatment/Interventions Electrical Stimulation;Moist Heat;Ultrasound;Therapeutic activities;Therapeutic exercise;Neuromuscular re-education;Patient/family education;Passive range of motion;Manual techniques;Dry needling;Energy conservation   PT Next Visit Plan continue with balance, postural strength, building endurance and hip flexion ROM exercises   PT Home Exercise Plan progress as needed   Recommended Other Services renewal note sent to MD   Consulted and Agree with Plan of Care Patient      Patient will benefit from skilled therapeutic intervention in order to improve the following deficits and impairments:  Abnormal gait, Decreased range of motion, Difficulty walking, Increased fascial restricitons, Decreased activity tolerance, Decreased endurance, Pain, Decreased mobility, Decreased  strength  Visit Diagnosis: Muscle weakness (generalized) - Plan: PT plan of care cert/re-cert  Abnormal posture - Plan: PT plan of care cert/re-cert  Other abnormalities of gait and mobility - Plan: PT plan of care cert/re-cert     Problem List Patient Active Problem List  Diagnosis Date Noted  . Barrett's esophagus 05/20/2013  . HTN (hypertension) 05/20/2013  . GENERALIZED OSTEOARTHROSIS UNSPECIFIED SITE 06/10/2008  . EPIGASTRIC PAIN 06/10/2008  . GASTRIC ULCER, HX OF 06/10/2008  . INTERNAL HEMORRHOIDS 06/08/2008    Earlie Counts, PT 05/07/17 3:35 PM   [facility] [address] [place], [place], [address] Phone: [phone]   Fax:  [phone]  Name: Cameron Todd MRN: [id number] Date of Birth: [birth year]

## 2017-05-09 ENCOUNTER — Encounter: Payer: Medicare Other | Admitting: Physical Therapy

## 2017-05-09 ENCOUNTER — Ambulatory Visit: Payer: Medicare Other | Admitting: Physical Therapy

## 2017-05-09 ENCOUNTER — Encounter: Payer: Self-pay | Admitting: Physical Therapy

## 2017-05-09 DIAGNOSIS — R293 Abnormal posture: Secondary | ICD-10-CM | POA: Diagnosis not present

## 2017-05-09 DIAGNOSIS — M6281 Muscle weakness (generalized): Secondary | ICD-10-CM | POA: Diagnosis not present

## 2017-05-09 DIAGNOSIS — R2689 Other abnormalities of gait and mobility: Secondary | ICD-10-CM

## 2017-05-09 NOTE — Therapy (Signed)
[facility] [address], [place], [place], [address] Phone: [phone]   Fax:  [phone]  Physical Therapy Treatment  Patient Details  Name: Cameron Todd MRN: [id number] Date of Birth: 08-10-1932 Referring Provider: Dr. Leanna Battles  Encounter Date: 05/09/2017      PT End of Session - 05/09/17 1015    Visit Number 14   Number of Visits 20   Date for PT Re-Evaluation 07/03/17   Authorization Type Medicare g-code on 20th visit   PT Start Time 1010   PT Stop Time 1055   PT Time Calculation (min) 45 min   Activity Tolerance Patient tolerated treatment well   Behavior During Therapy [facility] for tasks assessed/performed      Past Medical History:  Diagnosis Date  . Barrett's esophagus   . Compression fracture of spine ([place])    T 10  . Diabetes ([place])   . Elevated cholesterol   . Gastric ulcer 1990  . History of colon polyps     Past Surgical History:  Procedure Laterality Date  . NO PAST SURGERIES    . UPPER GASTROINTESTINAL ENDOSCOPY      There were no vitals filed for this visit.      Subjective Assessment - 05/09/17 1016    Subjective I am great this AM.    Currently in Pain? Yes   Pain Score 3    Pain Location Thoracic   Pain Orientation Right   Pain Descriptors / Indicators Dull   Aggravating Factors  Worsens in afternoon   Pain Relieving Factors Brace, Estim   Multiple Pain Sites No                         [facility] Adult PT Treatment/Exercise - 05/09/17 0001      High Level Balance   High Level Balance Comments Floor ladder balance activities light CGA  climb stairs without UE up & down 2x     Neuro Re-ed    Neuro Re-ed Details  Alt toe taps standing on balack pad with light hands on rails 2x10, then side steps bil 10x son black pad.      Knee/Hip Exercises: Stretches   Hip Flexor Stretch Both;3 reps;20 seconds   Hip Flexor Stretch Limitations Off the edge of the  bed   Gastroc Stretch Both;3 reps;20 seconds   Gastroc Stretch Limitations On slant board     Knee/Hip Exercises: Aerobic   Nustep L3 x 10 min; seat #6; arm #7  review of status and goals     Knee/Hip Exercises: Standing   Other Standing Knee Exercises Walking with bigger steps while he satnds taller 2 laps     Shoulder Exercises: Standing   Other Standing Exercises Facing wall bank robbers 10x     Shoulder Exercises: Pulleys   Flexion 3 minutes                  PT Short Term Goals - 03/27/17 1050      PT SHORT TERM GOAL #2   Title ability to get in and out of bed with correct body mechanics with pain decreased >/= 25%   Time 4   Period Weeks   Status Achieved     PT SHORT TERM GOAL #3   Title understand correct posture to reduce strain on back and decrease pain   Time 4   Period Weeks   Status Achieved  PT SHORT TERM GOAL #4   Title understand information on osteoporosis and do's and do nots of osteoporosis   Time 4   Period Weeks   Status Achieved           PT Long Term Goals - 05/07/17 1448      PT LONG TERM GOAL #1   Title independent with HEP for postural strength   Time 8   Period Weeks   Status On-going     PT LONG TERM GOAL #2   Title stand erect with head away from wall </= 15 cm   Time 8   Period Weeks   Status On-going     PT LONG TERM GOAL #3   Title perform daily tasks including getting into bed with pain decreased >/= 50% due to improved posture   Time 8   Period Weeks   Status Achieved     PT LONG TERM GOAL #4   Title patient reports he walks with increased steadiness >/= 50% due to improve strength and posture   Baseline 40% steadier on level and unlevel surface   Time 8   Period Weeks   Status On-going     PT LONG TERM GOAL #5   Title foto score limitation </= 25% limitation   Baseline FOTO score is 43% limitation   Time 8   Period Weeks   Status On-going               Plan - 05/09/17 1017     Clinical Impression Statement Pt wa able to perform dynamic standing balance exercises with good stability, no LOB during session. He does have hard time lifting his arms off the wall when facing due to posture limitations. We also tried some stretching to help make standing more erect easier.    Rehab Potential Good   Clinical Impairments Affecting Rehab Potential compression fracture 2013; osteoporosis; T11 compression fracture with scoliosis and spondylosis; peripheral neuropathy   PT Frequency 2x / week   PT Duration 8 weeks   PT Treatment/Interventions Electrical Stimulation;Moist Heat;Ultrasound;Therapeutic activities;Therapeutic exercise;Neuromuscular re-education;Patient/family education;Passive range of motion;Manual techniques;Dry needling;Energy conservation   PT Next Visit Plan continue with balance, postural strength, building endurance and hip flexion ROM exercises   Consulted and Agree with Plan of Care --      Patient will benefit from skilled therapeutic intervention in order to improve the following deficits and impairments:  Abnormal gait, Decreased range of motion, Difficulty walking, Increased fascial restricitons, Decreased activity tolerance, Decreased endurance, Pain, Decreased mobility, Decreased strength  Visit Diagnosis: Muscle weakness (generalized)  Abnormal posture  Other abnormalities of gait and mobility     Problem List Patient Active Problem List   Diagnosis Date Noted  . Barrett's esophagus 05/20/2013  . HTN (hypertension) 05/20/2013  . GENERALIZED OSTEOARTHROSIS UNSPECIFIED SITE 06/10/2008  . EPIGASTRIC PAIN 06/10/2008  . GASTRIC ULCER, HX OF 06/10/2008  . INTERNAL HEMORRHOIDS 06/08/2008    COCHRAN,JENNIFER, PTA 05/09/2017, 10:58 AM   [facility] [address], [place], [place], [address] Phone: [phone]   Fax:  [phone]  Name: Cameron Todd MRN: [id number] Date of Birth:  09-12-1932

## 2017-05-15 ENCOUNTER — Encounter: Payer: Self-pay | Admitting: Physical Therapy

## 2017-05-15 ENCOUNTER — Ambulatory Visit: Payer: Medicare Other | Admitting: Physical Therapy

## 2017-05-15 DIAGNOSIS — R2689 Other abnormalities of gait and mobility: Secondary | ICD-10-CM | POA: Diagnosis not present

## 2017-05-15 DIAGNOSIS — R293 Abnormal posture: Secondary | ICD-10-CM

## 2017-05-15 DIAGNOSIS — M6281 Muscle weakness (generalized): Secondary | ICD-10-CM | POA: Diagnosis not present

## 2017-05-15 NOTE — Therapy (Signed)
[facility] [address] [place], [place], [address] Phone: [phone]   Fax:  [phone]  Physical Therapy Treatment  Patient Details  Name: Cameron Todd MRN: [id number] Date of Birth: 1933-06-15 Referring Provider: Dr. Leanna Battles  Encounter Date: 05/15/2017      PT End of Session - 05/15/17 1406    Visit Number 15   Number of Visits 20   Date for PT Re-Evaluation 07/03/17   Authorization Type Medicare g-code on 20th visit; use KX modifier   PT Start Time 1400   PT Stop Time 1442   PT Time Calculation (min) 42 min   Activity Tolerance Patient tolerated treatment well   Behavior During Therapy [facility] for tasks assessed/performed      Past Medical History:  Diagnosis Date  . Barrett's esophagus   . Compression fracture of spine ([place])    T 10  . Diabetes ([place])   . Elevated cholesterol   . Gastric ulcer 1990  . History of colon polyps     Past Surgical History:  Procedure Laterality Date  . NO PAST SURGERIES    . UPPER GASTROINTESTINAL ENDOSCOPY      There were no vitals filed for this visit.      Subjective Assessment - 05/15/17 1403    Subjective Today my back pain is not too bad.  I feel good today. I am having more good days than bad days. Muscular pain is with patient for a long time. I want to stand straighter and I feel unsteady when walking.    Patient Stated Goals reduce pain   Currently in Pain? Yes   Pain Score 2    Pain Location Thoracic   Pain Orientation Right   Pain Descriptors / Indicators Dull  muscular pain   Pain Type Chronic pain   Pain Onset In the past 7 days   Pain Frequency Intermittent   Aggravating Factors  Worse in the afternoon   Pain Relieving Factors brace, estim   Multiple Pain Sites No                         [facility] Adult PT Treatment/Exercise - 05/15/17 0001      Knee/Hip Exercises: Aerobic   Nustep L3 x 10 min; seat #6; arm #7   review of status and goals                PT Education - 05/15/17 1440    Education provided Yes   Education Details back strengthening, lay on stomach to improve trunk extension; standing shoulder movement to increase back extension and strength, corner stretch   Person(s) Educated Patient   Methods Explanation;Demonstration;Verbal cues;Handout   Comprehension Verbalized understanding;Returned demonstration          PT Short Term Goals - 03/27/17 1050      PT SHORT TERM GOAL #2   Title ability to get in and out of bed with correct body mechanics with pain decreased >/= 25%   Time 4   Period Weeks   Status Achieved     PT SHORT TERM GOAL #3   Title understand correct posture to reduce strain on back and decrease pain   Time 4   Period Weeks   Status Achieved     PT SHORT TERM GOAL #4   Title understand information on osteoporosis and do's and do nots of osteoporosis   Time 4   Period Weeks  Status Achieved           PT Long Term Goals - 05/15/17 1407      PT LONG TERM GOAL #1   Title independent with HEP for postural strength   Time 8   Period Weeks   Status On-going     PT LONG TERM GOAL #2   Title stand erect with head away from wall </= 15 cm   Time 8   Period Weeks   Status On-going     PT LONG TERM GOAL #3   Title perform daily tasks including getting into bed with pain decreased >/= 50% due to improved posture   Time 8   Period Weeks   Status Achieved     PT LONG TERM GOAL #4   Title patient reports he walks with increased steadiness >/= 50% due to improve strength and posture   Baseline 40% steadier on level and unlevel surface   Time 8   Period Weeks   Status On-going               Plan - 05/15/17 1406    Clinical Impression Statement Patient was tired after his treatment.  Patient was able to lay on his stomach to increase trunk extension and lift one leg.  Patient was able to demonstrate the new HEP correctly with  minimal to no verbal cues.  Patient has less back pain today.  Patient will benefit from skilled therapy to improve trunk extension ROM and strength while improving balance.    Rehab Potential Good   Clinical Impairments Affecting Rehab Potential compression fracture 2013; osteoporosis; T11 compression fracture with scoliosis and spondylosis; peripheral neuropathy   PT Frequency 2x / week   PT Duration 8 weeks   PT Treatment/Interventions Electrical Stimulation;Moist Heat;Ultrasound;Therapeutic activities;Therapeutic exercise;Neuromuscular re-education;Patient/family education;Passive range of motion;Manual techniques;Dry needling;Energy conservation   PT Next Visit Plan continue with balance, postural strength, building endurance and hip flexion ROM exercises   PT Home Exercise Plan HEP for balance   Consulted and Agree with Plan of Care Patient      Patient will benefit from skilled therapeutic intervention in order to improve the following deficits and impairments:  Abnormal gait, Decreased range of motion, Difficulty walking, Increased fascial restricitons, Decreased activity tolerance, Decreased endurance, Pain, Decreased mobility, Decreased strength  Visit Diagnosis: Muscle weakness (generalized)  Abnormal posture  Cameron abnormalities of gait and mobility     Problem List Patient Active Problem List   Diagnosis Date Noted  . Barrett's esophagus 05/20/2013  . HTN (hypertension) 05/20/2013  . GENERALIZED OSTEOARTHROSIS UNSPECIFIED SITE 06/10/2008  . EPIGASTRIC PAIN 06/10/2008  . GASTRIC ULCER, HX OF 06/10/2008  . INTERNAL HEMORRHOIDS 06/08/2008    Earlie Counts, PT 05/15/17 2:44 PM   [facility] [address] [place], [place], [address] Phone: [phone]   Fax:  [phone]  Name: Cameron Todd MRN: [id number] Date of Birth: June 25, 1933

## 2017-05-15 NOTE — Patient Instructions (Addendum)
Back Scratcher    Hold stick with one hand behind neck. Drop stick behind back. Grasp stick with other hand behind low back. Bring hands as close together as possible, thumbs together. Alternately pull stick up and down. Repeat _10__ times. Switch hands and repeat. 1 time per day. Only work where there is a comfortable stretch.   Copyright  VHI. All rights reserved.  Bilateral Front Arm Raise    Standing in neutral posture, on floor, raise both arms overhead. Hold __1__ seconds. Do _10___ repetitions, __1__ sets. Do in sitting and keep hip bent.   Copyright  VHI. All rights reserved.  Corner Push-A-Way    Face corner. Hands on walls, shoulder height, fingers pointed up. Keep body straight (like a board), elbows up; lean into corner. Keep heels on floor. Hold _15__ seconds. Push away with arms. Repeat __2_ times.  Copyright  VHI. All rights reserved.  Calf Stretch    Hands on wall, shoulder height, slightly wider than shoulders, fingers up. Left foot ahead of right. Body straight (like a board); lean into wall by bending front knee. Keep right heel on floor and foot straight ahead. Hold _30__ seconds. Push away with arms. Repeat __2_ times. Do on other leg.  Copyright  VHI. All rights reserved.  Shoulder Blade Squeeze    Stand in good body alignment. Interlace fingers behind sacrum. Squeeze backbone with shoulder blades. Keep body still, move only shoulder blades. Keep hands close to body. Hold _5__ seconds. Relax shoulders. Do not shrug shoulders. Repeat _5__ times. 2 times per day  Copyright  VHI. All rights reserved.  Wall Slide    Stand with back to wall, feet 12 - 18 inches away, knees and feet apart, toes pointed slightly outward. Slide down wall by bending knees. Keep knees in alignment with toes. Keep buttocks against the wall. Hold maximum position __5_ seconds. Return to start by pushing with knees. 10 times 1 time per day.    Copyright  VHI. All rights  reserved.    Abdominal Lying    Lie on abdomen, pillows as needed under hips, ankles, forehead and chest. Rest forehead on hands or on folded towel as needed and/or instructed by your therapist. Lie _3__ minutes. Then lift one leg in the air 10 times and the other leg 10 times.  Copyright  VHI. All rights reserved.  Cameron Todd [address], [place], [place] [address] Phone # [phone] Fax [phone]

## 2017-05-18 ENCOUNTER — Encounter: Payer: Self-pay | Admitting: Physical Therapy

## 2017-05-18 ENCOUNTER — Ambulatory Visit: Payer: Medicare Other | Admitting: Physical Therapy

## 2017-05-18 DIAGNOSIS — R2689 Other abnormalities of gait and mobility: Secondary | ICD-10-CM

## 2017-05-18 DIAGNOSIS — M6281 Muscle weakness (generalized): Secondary | ICD-10-CM

## 2017-05-18 DIAGNOSIS — R293 Abnormal posture: Secondary | ICD-10-CM | POA: Diagnosis not present

## 2017-05-18 NOTE — Therapy (Signed)
[facility] [address] [place], [place], [address] Phone: [phone]   Fax:  [phone]  Physical Therapy Treatment  Patient Details  Name: Cameron Todd MRN: [id number] Date of Birth: [birth year] Referring Provider: Dr. Leanna Battles  Encounter Date: 05/18/2017      PT End of Session - 05/18/17 1133    Visit Number 16   Number of Visits 20   Date for PT Re-Evaluation 07/03/17   Authorization Type Medicare g-code on 20th visit; use KX modifier   PT Start Time 1100   PT Stop Time 1140   PT Time Calculation (min) 40 min   Activity Tolerance Patient tolerated treatment well   Behavior During Therapy [facility] for tasks assessed/performed      Past Medical History:  Diagnosis Date  . Barrett's esophagus   . Compression fracture of spine ([place])    T 10  . Diabetes ([place])   . Elevated cholesterol   . Gastric ulcer 1990  . History of colon polyps     Past Surgical History:  Procedure Laterality Date  . NO PAST SURGERIES    . UPPER GASTROINTESTINAL ENDOSCOPY      There were no vitals filed for this visit.      Subjective Assessment - 05/18/17 1107    Subjective I have been doing the exercises and feel good.    Patient Stated Goals reduce pain   Currently in Pain? Yes   Pain Score 3    Pain Location Thoracic   Pain Orientation Right   Pain Descriptors / Indicators Dull   Pain Type Chronic pain   Pain Onset In the past 7 days   Pain Frequency Intermittent   Aggravating Factors  worse in the afternoon   Pain Relieving Factors brace, estim   Effect of Pain on Daily Activities overdoing activity   Multiple Pain Sites No                         [facility] Adult PT Treatment/Exercise - 05/18/17 0001      Knee/Hip Exercises: Aerobic   Nustep L3 x 10 min; seat #6; arm #7  review of status and goals     Knee/Hip Exercises: Standing   Heel Raises Right;Left;10 reps;3 sets   Heel Raises  Limitations on step    Wall Squat 1 set;10 reps;5 seconds  tactile cues to keep buttocks on the wall   SLS one legged stance 10 sec 3x each with c.g   Other Standing Knee Exercises tandem stance 30 sec 2 times each way      Shoulder Exercises: Standing   Flexion Strengthening;Both;10 reps  facing wall   ABduction Strengthening;Both;10 reps  facing wall   Other Standing Exercises wall push up 15x     Shoulder Exercises: Pulleys   Flexion 2 minutes                  PT Short Term Goals - 03/27/17 1050      PT SHORT TERM GOAL #2   Title ability to get in and out of bed with correct body mechanics with pain decreased >/= 25%   Time 4   Period Weeks   Status Achieved     PT SHORT TERM GOAL #3   Title understand correct posture to reduce strain on back and decrease pain   Time 4   Period Weeks   Status Achieved     PT SHORT  TERM GOAL #4   Title understand information on osteoporosis and do's and do nots of osteoporosis   Time 4   Period Weeks   Status Achieved           PT Long Term Goals - 05/15/17 1407      PT LONG TERM GOAL #1   Title independent with HEP for postural strength   Time 8   Period Weeks   Status On-going     PT LONG TERM GOAL #2   Title stand erect with head away from wall </= 15 cm   Time 8   Period Weeks   Status On-going     PT LONG TERM GOAL #3   Title perform daily tasks including getting into bed with pain decreased >/= 50% due to improved posture   Time 8   Period Weeks   Status Achieved     PT LONG TERM GOAL #4   Title patient reports he walks with increased steadiness >/= 50% due to improve strength and posture   Baseline 40% steadier on level and unlevel surface   Time 8   Period Weeks   Status On-going               Plan - 05/18/17 1108    Clinical Impression Statement Patient was able to tandem stance longer without holding on and with single leg stance. Patient was working on thoracic extension with arm  movement while facing the wall.  Patient will benefit from skilled therapy to improve trunk extension ROM and strength while improve balance.    Rehab Potential Good   Clinical Impairments Affecting Rehab Potential compression fracture 2013; osteoporosis; T11 compression fracture with scoliosis and spondylosis; peripheral neuropathy   PT Frequency 2x / week   PT Duration 8 weeks   PT Treatment/Interventions Electrical Stimulation;Moist Heat;Ultrasound;Therapeutic activities;Therapeutic exercise;Neuromuscular re-education;Patient/family education;Passive range of motion;Manual techniques;Dry needling;Energy conservation   PT Next Visit Plan continue with balance, postural strength, building endurance and hip flexion ROM exercises   PT Home Exercise Plan HEP for balance   Consulted and Agree with Plan of Care Patient      Patient will benefit from skilled therapeutic intervention in order to improve the following deficits and impairments:  Abnormal gait, Decreased range of motion, Difficulty walking, Increased fascial restricitons, Decreased activity tolerance, Decreased endurance, Pain, Decreased mobility, Decreased strength  Visit Diagnosis: Muscle weakness (generalized)  Abnormal posture  Other abnormalities of gait and mobility     Problem List Patient Active Problem List   Diagnosis Date Noted  . Barrett's esophagus 05/20/2013  . HTN (hypertension) 05/20/2013  . GENERALIZED OSTEOARTHROSIS UNSPECIFIED SITE 06/10/2008  . EPIGASTRIC PAIN 06/10/2008  . GASTRIC ULCER, HX OF 06/10/2008  . INTERNAL HEMORRHOIDS 06/08/2008    Earlie Counts, PT 05/18/17 11:36 AM   [facility] [address] [place], [place], [address] Phone: [phone]   Fax:  [phone]  Name: Cameron Todd MRN: [id number] Date of Birth: 1933/03/01

## 2017-05-21 ENCOUNTER — Encounter: Payer: Self-pay | Admitting: Physical Therapy

## 2017-05-21 ENCOUNTER — Ambulatory Visit: Payer: Medicare Other | Admitting: Physical Therapy

## 2017-05-21 DIAGNOSIS — M6281 Muscle weakness (generalized): Secondary | ICD-10-CM

## 2017-05-21 DIAGNOSIS — R2689 Other abnormalities of gait and mobility: Secondary | ICD-10-CM | POA: Diagnosis not present

## 2017-05-21 DIAGNOSIS — R293 Abnormal posture: Secondary | ICD-10-CM

## 2017-05-21 NOTE — Therapy (Signed)
[facility] [address], [place], [place], [address] Phone: [phone]   Fax:  [phone]  Physical Therapy Treatment  Patient Details  Name: Cameron Todd MRN: [id number] Date of Birth: 12-26-1932 Referring Provider: Dr. Leanna Battles  Encounter Date: 05/21/2017      PT End of Session - 05/21/17 1403    Visit Number 17   Number of Visits 20   Date for PT Re-Evaluation 07/03/17   Authorization Type Medicare g-code on 20th visit; use KX modifier   PT Start Time 1358   PT Stop Time 1440   PT Time Calculation (min) 42 min   Activity Tolerance Patient tolerated treatment well   Behavior During Therapy [facility] for tasks assessed/performed      Past Medical History:  Diagnosis Date  . Barrett's esophagus   . Compression fracture of spine ([place])    T 10  . Diabetes ([place])   . Elevated cholesterol   . Gastric ulcer 1990  . History of colon polyps     Past Surgical History:  Procedure Laterality Date  . NO PAST SURGERIES    . UPPER GASTROINTESTINAL ENDOSCOPY      There were no vitals filed for this visit.      Subjective Assessment - 05/21/17 1402    Subjective Minimal pain, do not like the cold.    Currently in Pain? Yes   Pain Score 2    Pain Location Thoracic   Pain Orientation Right   Pain Descriptors / Indicators Dull   Multiple Pain Sites No                         [facility] Adult PT Treatment/Exercise - 05/21/17 0001      Neuro Re-ed    Neuro Re-ed Details  Balance ex on mini trampolne, SBA   tandem walking, CGA 4x 30 feet     Knee/Hip Exercises: Aerobic   Nustep L3 x 10 min; seat #6; arm #7  review of status and goals     Knee/Hip Exercises: Standing   Heel Raises Both;20 reps   Heel Raises Limitations on mini tramp   Hip Flexion Both;20 reps;Knee bent   Hip Flexion Limitations on mini tramp   Other Standing Knee Exercises 4 laps of gym with awareness of posture.      Knee/Hip Exercises: Seated   Long Arc Quad Strengthening;Both;2 sets;10 reps;Weights   Long Arc Quad Weight 4 lbs.                  PT Short Term Goals - 03/27/17 1050      PT SHORT TERM GOAL #2   Title ability to get in and out of bed with correct body mechanics with pain decreased >/= 25%   Time 4   Period Weeks   Status Achieved     PT SHORT TERM GOAL #3   Title understand correct posture to reduce strain on back and decrease pain   Time 4   Period Weeks   Status Achieved     PT SHORT TERM GOAL #4   Title understand information on osteoporosis and do's and do nots of osteoporosis   Time 4   Period Weeks   Status Achieved           PT Long Term Goals - 05/15/17 1407      PT LONG TERM GOAL #1   Title independent with HEP for postural strength  Time 8   Period Weeks   Status On-going     PT LONG TERM GOAL #2   Title stand erect with head away from wall </= 15 cm   Time 8   Period Weeks   Status On-going     PT LONG TERM GOAL #3   Title perform daily tasks including getting into bed with pain decreased >/= 50% due to improved posture   Time 8   Period Weeks   Status Achieved     PT LONG TERM GOAL #4   Title patient reports he walks with increased steadiness >/= 50% due to improve strength and posture   Baseline 40% steadier on level and unlevel surface   Time 8   Period Weeks   Status On-going               Plan - 05/21/17 1404    Clinical Impression Statement Pt performed his balance exercises on the mini tramp to work on an unlevel surface. He did well, not needing much in terms of UE assistance.    Rehab Potential Good   Clinical Impairments Affecting Rehab Potential compression fracture 2013; osteoporosis; T11 compression fracture with scoliosis and spondylosis; peripheral neuropathy   PT Duration 8 weeks   PT Treatment/Interventions Electrical Stimulation;Moist Heat;Ultrasound;Therapeutic activities;Therapeutic  exercise;Neuromuscular re-education;Patient/family education;Passive range of motion;Manual techniques;Dry needling;Energy conservation   PT Next Visit Plan continue with balance, postural strength, building endurance and hip flexion ROM exercises   Consulted and Agree with Plan of Care Patient      Patient will benefit from skilled therapeutic intervention in order to improve the following deficits and impairments:  Abnormal gait, Decreased range of motion, Difficulty walking, Increased fascial restricitons, Decreased activity tolerance, Decreased endurance, Pain, Decreased mobility, Decreased strength  Visit Diagnosis: Muscle weakness (generalized)  Abnormal posture  Other abnormalities of gait and mobility     Problem List Patient Active Problem List   Diagnosis Date Noted  . Barrett's esophagus 05/20/2013  . HTN (hypertension) 05/20/2013  . GENERALIZED OSTEOARTHROSIS UNSPECIFIED SITE 06/10/2008  . EPIGASTRIC PAIN 06/10/2008  . GASTRIC ULCER, HX OF 06/10/2008  . INTERNAL HEMORRHOIDS 06/08/2008    COCHRAN,JENNIFER, PTA 05/21/2017, 2:39 PM  [facility] [address], [place], [place], [address] Phone: [phone]   Fax:  [phone]  Name: SKANDA WORLDS MRN: [id number] Date of Birth: 17-Oct-1932

## 2017-05-22 ENCOUNTER — Encounter (HOSPITAL_COMMUNITY): Payer: Self-pay

## 2017-05-22 ENCOUNTER — Ambulatory Visit (HOSPITAL_COMMUNITY)
Admission: RE | Admit: 2017-05-22 | Discharge: 2017-05-22 | Disposition: A | Discharge status: Home / Self Care | Payer: Medicare Other | Source: Ambulatory Visit | Attending: Internal Medicine | Admitting: Internal Medicine

## 2017-05-22 DIAGNOSIS — M810 Age-related osteoporosis without current pathological fracture: Secondary | ICD-10-CM | POA: Insufficient documentation

## 2017-05-22 MED ORDER — DENOSUMAB 60 MG/ML ~~LOC~~ SOLN
60.0000 mg | Freq: Once | SUBCUTANEOUS | Status: AC
  Administered 2017-05-22: 60 mg via SUBCUTANEOUS
  Filled 2017-05-22: qty 1

## 2017-05-22 NOTE — Discharge Instructions (Signed)
For your next appointment please contact your doctor's office.  You will be due another Prolia injection after November 20, 2017, it is due every 6  Months.  Denosumab injection What is this medicine? DENOSUMAB (den oh sue mab) slows bone breakdown. Prolia is used to treat osteoporosis in women after menopause and in men. Delton See is used to treat a high calcium level due to cancer and to prevent bone fractures and other bone problems caused by multiple myeloma or cancer bone metastases. Delton See is also used to treat giant cell tumor of the bone. This medicine may be used for other purposes; ask your health care provider or pharmacist if you have questions. COMMON BRAND NAME(S): Prolia, XGEVA What should I tell my health care provider before I take this medicine? They need to know if you have any of these conditions: -dental disease -having surgery or tooth extraction -infection -kidney disease -low levels of calcium or Vitamin D in the blood -malnutrition -on hemodialysis -skin conditions or sensitivity -thyroid or parathyroid disease -an unusual reaction to denosumab, other medicines, foods, dyes, or preservatives -pregnant or trying to get pregnant -breast-feeding How should I use this medicine? This medicine is for injection under the skin. It is given by a health care professional in a hospital or clinic setting. If you are getting Prolia, a special MedGuide will be given to you by the pharmacist with each prescription and refill. Be sure to read this information carefully each time. For Prolia, talk to your pediatrician regarding the use of this medicine in children. Special care may be needed. For Delton See, talk to your pediatrician regarding the use of this medicine in children. While this drug may be prescribed for children as young as 13 years for selected conditions, precautions do apply. Overdosage: If you think you have taken too much of this medicine contact a poison control center or  emergency room at once. NOTE: This medicine is only for you. Do not share this medicine with others. What if I miss a dose? It is important not to miss your dose. Call your doctor or health care professional if you are unable to keep an appointment. What may interact with this medicine? Do not take this medicine with any of the following medications: -other medicines containing denosumab This medicine may also interact with the following medications: -medicines that lower your chance of fighting infection -steroid medicines like prednisone or cortisone This list may not describe all possible interactions. Give your health care provider a list of all the medicines, herbs, non-prescription drugs, or dietary supplements you use. Also tell them if you smoke, drink alcohol, or use illegal drugs. Some items may interact with your medicine. What should I watch for while using this medicine? Visit your doctor or health care professional for regular checks on your progress. Your doctor or health care professional may order blood tests and other tests to see how you are doing. Call your doctor or health care professional for advice if you get a fever, chills or sore throat, or other symptoms of a cold or flu. Do not treat yourself. This drug may decrease your body's ability to fight infection. Try to avoid being around people who are sick. You should make sure you get enough calcium and vitamin D while you are taking this medicine, unless your doctor tells you not to. Discuss the foods you eat and the vitamins you take with your health care professional. See your dentist regularly. Brush and floss your teeth as directed.  Before you have any dental work done, tell your dentist you are receiving this medicine. Do not become pregnant while taking this medicine or for 5 months after stopping it. Talk with your doctor or health care professional about your birth control options while taking this medicine. Women  should inform their doctor if they wish to become pregnant or think they might be pregnant. There is a potential for serious side effects to an unborn child. Talk to your health care professional or pharmacist for more information. What side effects may I notice from receiving this medicine? Side effects that you should report to your doctor or health care professional as soon as possible: -allergic reactions like skin rash, itching or hives, swelling of the face, lips, or tongue -bone pain -breathing problems -dizziness -jaw pain, especially after dental work -redness, blistering, peeling of the skin -signs and symptoms of infection like fever or chills; cough; sore throat; pain or trouble passing urine -signs of low calcium like fast heartbeat, muscle cramps or muscle pain; pain, tingling, numbness in the hands or feet; seizures -unusual bleeding or bruising -unusually weak or tired Side effects that usually do not require medical attention (report to your doctor or health care professional if they continue or are bothersome): -constipation -diarrhea -headache -joint pain -loss of appetite -muscle pain -runny nose -tiredness -upset stomach This list may not describe all possible side effects. Call your doctor for medical advice about side effects. You may report side effects to FDA at 1-800-FDA-1088. Where should I keep my medicine? This medicine is only given in a clinic, doctor's office, or other health care setting and will not be stored at home. NOTE: This sheet is a summary. It may not cover all possible information. If you have questions about this medicine, talk to your doctor, pharmacist, or health care provider.  2018 Elsevier/Gold Standard (2016-08-01 19:17:21)

## 2017-05-22 NOTE — Progress Notes (Signed)
Prolia 60 mg SQ given to rt upper outer arm.  No bleeding noted.  D/c instructions given on prolia.  Pt informed to call his MD office for the next appointment.  He will be due after November 20, 2017.  Pt voiced understanding.

## 2017-05-23 ENCOUNTER — Ambulatory Visit: Payer: Medicare Other | Admitting: Physical Therapy

## 2017-05-23 ENCOUNTER — Encounter: Payer: Self-pay | Admitting: Physical Therapy

## 2017-05-23 DIAGNOSIS — R293 Abnormal posture: Secondary | ICD-10-CM

## 2017-05-23 DIAGNOSIS — M6281 Muscle weakness (generalized): Secondary | ICD-10-CM | POA: Diagnosis not present

## 2017-05-23 DIAGNOSIS — R2689 Other abnormalities of gait and mobility: Secondary | ICD-10-CM | POA: Diagnosis not present

## 2017-05-23 NOTE — Therapy (Signed)
[facility] [address], [place], [place], [address] Phone: [phone]   Fax:  [phone]  Physical Therapy Treatment  Patient Details  Name: Cameron Todd MRN: [id number] Date of Birth: March 29, 1933 Referring Provider: Dr. Leanna Battles  Encounter Date: 05/23/2017      PT End of Session - 05/23/17 1404    Visit Number 18   Number of Visits 20   Date for PT Re-Evaluation 07/03/17   Authorization Type Medicare g-code on 20th visit; use KX modifier   PT Start Time 1401   PT Stop Time 1447   PT Time Calculation (min) 46 min   Activity Tolerance Patient tolerated treatment well   Behavior During Therapy [facility] for tasks assessed/performed      Past Medical History:  Diagnosis Date  . Barrett's esophagus   . Compression fracture of spine ([place])    T 10  . Diabetes ([place])   . Elevated cholesterol   . Gastric ulcer 1990  . History of colon polyps     Past Surgical History:  Procedure Laterality Date  . NO PAST SURGERIES    . UPPER GASTROINTESTINAL ENDOSCOPY      There were no vitals filed for this visit.      Subjective Assessment - 05/23/17 1404    Subjective No complaints after last session.    Currently in Pain? No/denies   Multiple Pain Sites No                         [facility] Adult PT Treatment/Exercise - 05/23/17 0001      Neuro Re-ed    Neuro Re-ed Details  Balance ex on mini trampolne, SBA   tandem walking, CGA 4x 30 feet: tramp no UE     Knee/Hip Exercises: Standing   Heel Raises Both;20 reps   Heel Raises Limitations on mini tramp   Hip Flexion Both;20 reps;Knee bent   Hip Flexion Limitations on mini tramp   Forward Step Up --  Stance leg on foam/opposite leg up 6 inch step up 10x bil   Walking with Sports Cord 20# forward 10x, backwards 10x,   light CGA     Knee/Hip Exercises: Seated   Long Arc Quad Strengthening;Both;2 sets;10 reps;Weights   Long Arc Quad Weight 4  lbs.                  PT Short Term Goals - 03/27/17 1050      PT SHORT TERM GOAL #2   Title ability to get in and out of bed with correct body mechanics with pain decreased >/= 25%   Time 4   Period Weeks   Status Achieved     PT SHORT TERM GOAL #3   Title understand correct posture to reduce strain on back and decrease pain   Time 4   Period Weeks   Status Achieved     PT SHORT TERM GOAL #4   Title understand information on osteoporosis and do's and do nots of osteoporosis   Time 4   Period Weeks   Status Achieved           PT Long Term Goals - 05/23/17 1407      PT LONG TERM GOAL #4   Title patient reports he walks with increased steadiness >/= 50% due to improve strength and posture   Baseline 40% steadier on level and unlevel surface   Time 8   Period  Weeks   Status On-going  about the same               Plan - 05/23/17 1404    Rehab Potential Good   Clinical Impairments Affecting Rehab Potential compression fracture 2013; osteoporosis; T11 compression fracture with scoliosis and spondylosis; peripheral neuropathy   PT Frequency 2x / week   PT Duration 8 weeks   PT Treatment/Interventions Electrical Stimulation;Moist Heat;Ultrasound;Therapeutic activities;Therapeutic exercise;Neuromuscular re-education;Patient/family education;Passive range of motion;Manual techniques;Dry needling;Energy conservation   PT Next Visit Plan continue with balance, postural strength, building endurance and hip flexion ROM exercises   Consulted and Agree with Plan of Care Patient      Patient will benefit from skilled therapeutic intervention in order to improve the following deficits and impairments:  Abnormal gait, Decreased range of motion, Difficulty walking, Increased fascial restricitons, Decreased activity tolerance, Decreased endurance, Pain, Decreased mobility, Decreased strength  Visit Diagnosis: Muscle weakness (generalized)  Abnormal  posture  Other abnormalities of gait and mobility     Problem List Patient Active Problem List   Diagnosis Date Noted  . Barrett's esophagus 05/20/2013  . HTN (hypertension) 05/20/2013  . GENERALIZED OSTEOARTHROSIS UNSPECIFIED SITE 06/10/2008  . EPIGASTRIC PAIN 06/10/2008  . GASTRIC ULCER, HX OF 06/10/2008  . INTERNAL HEMORRHOIDS 06/08/2008    COCHRAN,JENNIFER, PTA 05/23/2017, 2:48 PM  [facility] [address] [place], [place], [address] Phone: [phone]   Fax:  [phone]  Name: SEBASTYAN SNODGRASS MRN: [id number] Date of Birth: 1932/12/01

## 2017-05-28 ENCOUNTER — Encounter: Payer: Self-pay | Admitting: Physical Therapy

## 2017-05-28 ENCOUNTER — Ambulatory Visit: Payer: Medicare Other | Attending: Internal Medicine | Admitting: Physical Therapy

## 2017-05-28 DIAGNOSIS — M6281 Muscle weakness (generalized): Secondary | ICD-10-CM | POA: Insufficient documentation

## 2017-05-28 DIAGNOSIS — R293 Abnormal posture: Secondary | ICD-10-CM | POA: Diagnosis not present

## 2017-05-28 DIAGNOSIS — R2689 Other abnormalities of gait and mobility: Secondary | ICD-10-CM | POA: Diagnosis not present

## 2017-05-28 NOTE — Therapy (Signed)
[facility] [address] [place], [place], [address] Phone: [phone]   Fax:  [phone]  Physical Therapy Treatment  Patient Details  Name: Cameron Todd MRN: [id number] Date of Birth: 01-02-1933 Referring Provider: Dr. Leanna Battles   Encounter Date: 05/28/2017  PT End of Session - 05/28/17 1414    Visit Number  19    Number of Visits  20    Date for PT Re-Evaluation  07/03/17    Authorization Type  Medicare g-code on 20th visit; use KX modifier    PT Start Time  1403    PT Stop Time  1441    PT Time Calculation (min)  38 min    Activity Tolerance  Patient tolerated treatment well    Behavior During Therapy  [facility] for tasks assessed/performed       Past Medical History:  Diagnosis Date  . Barrett's esophagus   . Compression fracture of spine ([place])    T 10  . Diabetes ([place])   . Elevated cholesterol   . Gastric ulcer 1990  . History of colon polyps     Past Surgical History:  Procedure Laterality Date  . NO PAST SURGERIES    . UPPER GASTROINTESTINAL ENDOSCOPY      There were no vitals filed for this visit.  Subjective Assessment - 05/28/17 1415    Subjective  No new complaints.    Currently in Pain?  Yes    Pain Score  1     Pain Location  Thoracic    Pain Orientation  Right    Pain Descriptors / Indicators  Dull    Aggravating Factors   worsens in afternoons    Pain Relieving Factors  brace,     Multiple Pain Sites  No                      [facility] Adult PT Treatment/Exercise - 05/28/17 0001      Neuro Re-ed    Neuro Re-ed Details   Balance ex on mini tramp, floor ladder, and BOSU single leg balance one foot on/one foot on floor like Warrior in yoga      Knee/Hip Exercises: Aerobic   Recumbent Bike  L1 x 5 min Difficult to do   Difficult to do   Nustep  --      Knee/Hip Exercises: Seated   Long Arc Quad  Strengthening;Both;2 sets;10 reps;Weights    Long Arc Quad Weight   4 lbs.      Shoulder Exercises: Seated   Theraband Level (Shoulder Horizontal ABduction)  Level 2 (Red) 2x15   2x15              PT Short Term Goals - 03/27/17 1050      PT SHORT TERM GOAL #2   Title  ability to get in and out of bed with correct body mechanics with pain decreased >/= 25%    Time  4    Period  Weeks    Status  Achieved      PT SHORT TERM GOAL #3   Title  understand correct posture to reduce strain on back and decrease pain    Time  4    Period  Weeks    Status  Achieved      PT SHORT TERM GOAL #4   Title  understand information on osteoporosis and do's and do nots of osteoporosis    Time  4  Period  Weeks    Status  Achieved        PT Long Term Goals - 05/23/17 1407      PT LONG TERM GOAL #4   Title  patient reports he walks with increased steadiness >/= 50% due to improve strength and posture    Baseline  40% steadier on level and unlevel surface    Time  8    Period  Weeks    Status  On-going about the same   about the same           Plan - 05/28/17 1415    Clinical Impression Statement  Pt performed some pretty high level balance exercises including single leg stance using the BOSU and floor ladder drills. Verbal cues to pt to make bigger LE movements/lifts during the floor ladder gave pt more challenge where he needed to regain his balance. No falls or near falls. Still no UE support used when on the mini tramp. He reported today his back was feeling better the last few days.     Rehab Potential  Good    Clinical Impairments Affecting Rehab Potential  compression fracture 2013; osteoporosis; T11 compression fracture with scoliosis and spondylosis; peripheral neuropathy    PT Frequency  2x / week    PT Duration  8 weeks    PT Treatment/Interventions  Electrical Stimulation;Moist Heat;Ultrasound;Therapeutic activities;Therapeutic exercise;Neuromuscular re-education;Patient/family education;Passive range of motion;Manual  techniques;Dry needling;Energy conservation    PT Next Visit Plan  continue with balance, postural strength, building endurance and hip flexion ROM exercises, G code next.    Consulted and Agree with Plan of Care  Patient       Patient will benefit from skilled therapeutic intervention in order to improve the following deficits and impairments:  Abnormal gait, Decreased range of motion, Difficulty walking, Increased fascial restricitons, Decreased activity tolerance, Decreased endurance, Pain, Decreased mobility, Decreased strength  Visit Diagnosis: Muscle weakness (generalized)  Abnormal posture  Other abnormalities of gait and mobility     Problem List Patient Active Problem List   Diagnosis Date Noted  . Barrett's esophagus 05/20/2013  . HTN (hypertension) 05/20/2013  . GENERALIZED OSTEOARTHROSIS UNSPECIFIED SITE 06/10/2008  . EPIGASTRIC PAIN 06/10/2008  . GASTRIC ULCER, HX OF 06/10/2008  . INTERNAL HEMORRHOIDS 06/08/2008    Cameron Todd, PTA 05/28/2017, 2:43 PM  [facility] [address] [place], [place], [address] Phone: [phone]   Fax:  [phone]  Name: Cameron Todd MRN: [id number] Date of Birth: 1932-12-16

## 2017-05-29 DIAGNOSIS — E559 Vitamin D deficiency, unspecified: Secondary | ICD-10-CM | POA: Diagnosis not present

## 2017-05-29 DIAGNOSIS — N183 Chronic kidney disease, stage 3 (moderate): Secondary | ICD-10-CM | POA: Diagnosis not present

## 2017-05-29 DIAGNOSIS — R7301 Impaired fasting glucose: Secondary | ICD-10-CM | POA: Diagnosis not present

## 2017-05-29 DIAGNOSIS — R82998 Other abnormal findings in urine: Secondary | ICD-10-CM | POA: Diagnosis not present

## 2017-05-29 DIAGNOSIS — E7849 Other hyperlipidemia: Secondary | ICD-10-CM | POA: Diagnosis not present

## 2017-05-29 DIAGNOSIS — Z125 Encounter for screening for malignant neoplasm of prostate: Secondary | ICD-10-CM | POA: Diagnosis not present

## 2017-05-30 ENCOUNTER — Encounter: Payer: Self-pay | Admitting: Physical Therapy

## 2017-05-30 ENCOUNTER — Ambulatory Visit: Payer: Medicare Other | Admitting: Physical Therapy

## 2017-05-30 DIAGNOSIS — R293 Abnormal posture: Secondary | ICD-10-CM | POA: Diagnosis not present

## 2017-05-30 DIAGNOSIS — R2689 Other abnormalities of gait and mobility: Secondary | ICD-10-CM

## 2017-05-30 DIAGNOSIS — M6281 Muscle weakness (generalized): Secondary | ICD-10-CM | POA: Diagnosis not present

## 2017-05-30 NOTE — Therapy (Addendum)
[facility] [address] [place], [place], [address] Phone: [phone]   Fax:  [phone]  Physical Therapy Treatment  Patient Details  Name: Cameron Todd MRN: [id number] Date of Birth: [birth year] Referring Provider: Dr. Leanna Battles   Encounter Date: 05/30/2017  PT End of Session - 05/30/17 1403    Visit Number  20    Number of Visits  20    Date for PT Re-Evaluation  07/03/17    Authorization Type  Medicare g-code on 20th visit; use KX modifier    PT Start Time  1400    PT Stop Time  1441    PT Time Calculation (min)  41 min    Activity Tolerance  Patient tolerated treatment well    Behavior During Therapy  [facility] for tasks assessed/performed       Past Medical History:  Diagnosis Date  . Barrett's esophagus   . Compression fracture of spine ([place])    T 10  . Diabetes ([place])   . Elevated cholesterol   . Gastric ulcer 1990  . History of colon polyps     Past Surgical History:  Procedure Laterality Date  . NO PAST SURGERIES    . UPPER GASTROINTESTINAL ENDOSCOPY      There were no vitals filed for this visit.  Subjective Assessment - 05/30/17 1405    Subjective  Low energy today.     Currently in Pain?  Yes    Pain Score  4     Pain Location  Thoracic    Pain Orientation  Right;Left    Multiple Pain Sites  No         [facility] PT Assessment - 05/30/17 0001      Observation/Other Assessments   Focus on Therapeutic Outcomes (FOTO)   46%                  [facility] Adult PT Treatment/Exercise - 05/30/17 0001      Neuro Re-ed    Neuro Re-ed Details   Balance ex on mini tramp, floor ladder, and BOSU single leg balance one foot on/one foot on floor like Warrior in yoga Standing beach ball batting 2 min, light CGA   Standing beach ball batting 2 min, light CGA     Knee/Hip Exercises: Aerobic   Nustep  L3 10 min      Knee/Hip Exercises: Standing   Heel Raises  Both;20 reps    Heel  Raises Limitations  on mini tramp    Hip Flexion  Both;20 reps;Knee bent    Hip Flexion Limitations  on mini tramp    Walking with Sports Cord  20# forward 10x, backwards 10x,  light CGA, postural cues   light CGA, postural cues     Knee/Hip Exercises: Seated   Long Arc Quad  Strengthening;Both;2 sets;10 reps;Weights    Long Arc Quad Weight  4 lbs.      Shoulder Exercises: Seated   Theraband Level (Shoulder Horizontal ABduction)  Level 2 (Red) 2x15   2x15              PT Short Term Goals - 03/27/17 1050      PT SHORT TERM GOAL #2   Title  ability to get in and out of bed with correct body mechanics with pain decreased >/= 25%    Time  4    Period  Weeks    Status  Achieved      PT SHORT  TERM GOAL #3   Title  understand correct posture to reduce strain on back and decrease pain    Time  4    Period  Weeks    Status  Achieved      PT SHORT TERM GOAL #4   Title  understand information on osteoporosis and do's and do nots of osteoporosis    Time  4    Period  Weeks    Status  Achieved        PT Long Term Goals - 05/23/17 1407      PT LONG TERM GOAL #4   Title  patient reports he walks with increased steadiness >/= 50% due to improve strength and posture    Baseline  40% steadier on level and unlevel surface    Time  8    Period  Weeks    Status  On-going about the same   about the same           Plan - 06/15/17 1407    Rehab Potential  Good    Clinical Impairments Affecting Rehab Potential  compression fracture 2013; osteoporosis; T11 compression fracture with scoliosis and spondylosis; peripheral neuropathy    PT Frequency  2x / week    PT Duration  8 weeks    PT Treatment/Interventions  Electrical Stimulation;Moist Heat;Ultrasound;Therapeutic activities;Therapeutic exercise;Neuromuscular re-education;Patient/family education;Passive range of motion;Manual techniques;Dry needling;Energy conservation    Consulted and Agree with Plan of Care  Patient        Patient will benefit from skilled therapeutic intervention in order to improve the following deficits and impairments:  Abnormal gait, Decreased range of motion, Difficulty walking, Increased fascial restricitons, Decreased activity tolerance, Decreased endurance, Pain, Decreased mobility, Decreased strength  Visit Diagnosis: Muscle weakness (generalized)  Abnormal posture  Other abnormalities of gait and mobility   G-Codes - 06/15/17 1530    Functional Assessment Tool Used (Outpatient Only)  FOTO score is 46% limitation    Functional Limitation  Other PT primary    Other PT Primary Current Status (V6720)  At least 40 percent but less than 60 percent impaired, limited or restricted    Other PT Primary Goal Status (N4709)  At least 20 percent but less than 40 percent impaired, limited or restricted       Problem List Patient Active Problem List   Diagnosis Date Noted  . Barrett's esophagus 05/20/2013  . HTN (hypertension) 05/20/2013  . GENERALIZED OSTEOARTHROSIS UNSPECIFIED SITE 06/10/2008  . EPIGASTRIC PAIN 06/10/2008  . GASTRIC ULCER, HX OF 06/10/2008  . INTERNAL HEMORRHOIDS 06/08/2008    Earlie Counts, PT 06/15/17 3:31 PM  Myrene Galas PTA  06/15/2017 3:31 PM  [facility] [address], [place], [place], [address] Phone: [phone]   Fax:  [phone]  Name: Cameron Todd MRN: [id number] Date of Birth: 09-20-1932

## 2017-06-04 ENCOUNTER — Encounter: Payer: Self-pay | Admitting: Physical Therapy

## 2017-06-04 ENCOUNTER — Ambulatory Visit: Payer: Medicare Other | Admitting: Physical Therapy

## 2017-06-04 DIAGNOSIS — R2689 Other abnormalities of gait and mobility: Secondary | ICD-10-CM | POA: Diagnosis not present

## 2017-06-04 DIAGNOSIS — M6281 Muscle weakness (generalized): Secondary | ICD-10-CM

## 2017-06-04 DIAGNOSIS — R293 Abnormal posture: Secondary | ICD-10-CM

## 2017-06-04 NOTE — Therapy (Signed)
[facility] [address], [place], [place], [address] Phone: [phone]   Fax:  [phone]  Physical Therapy Treatment  Patient Details  Name: Cameron Todd MRN: [id number] Date of Birth: [birth year] Referring Provider: Dr. Leanna Battles   Encounter Date: 06/04/2017  PT End of Session - 06/04/17 1402    Visit Number  21    Number of Visits  30    Date for PT Re-Evaluation  07/03/17    Authorization Type  Medicare g-code on 20th visit; use KX modifier    PT Start Time  1400    PT Stop Time  1441    PT Time Calculation (min)  41 min    Activity Tolerance  Patient tolerated treatment well    Behavior During Therapy  [facility] for tasks assessed/performed       Past Medical History:  Diagnosis Date  . Barrett's esophagus   . Compression fracture of spine ([place])    T 10  . Diabetes ([place])   . Elevated cholesterol   . Gastric ulcer 1990  . History of colon polyps     Past Surgical History:  Procedure Laterality Date  . NO PAST SURGERIES    . UPPER GASTROINTESTINAL ENDOSCOPY      There were no vitals filed for this visit.  Subjective Assessment - 06/04/17 1404    Subjective  Trying to get some rest.     Currently in Pain?  Yes My whole body aches today.    Pain Score  3     Aggravating Factors   It is fairly constant    Pain Relieving Factors  Brace    Multiple Pain Sites  No                      [facility] Adult PT Treatment/Exercise - 06/04/17 0001      Neuro Re-ed    Neuro Re-ed Details   Balance ex on mini tramp, floor ladder, and BOSU single leg balance one foot on/one foot on floor like Warrior in yoga Standing beach ball batting 2 min, light CGA      Knee/Hip Exercises: Aerobic   Nustep  L3 10 min PTA present      Knee/Hip Exercises: Standing   Heel Raises  Both;20 reps    Heel Raises Limitations  on mini tramp    Hip Flexion  Both;20 reps;Knee bent    Hip Flexion Limitations  on  mini tramp    Walking with Sports Cord  25# forward 10x, backwards 10x, then 5x sidestepping bil light CGA, postural cues      Knee/Hip Exercises: Seated   Long Arc Quad  Strengthening;Both;2 sets;10 reps;Weights    Long Arc Quad Weight  4 lbs.      Shoulder Exercises: Seated   Theraband Level (Shoulder Horizontal ABduction)  Level 2 (Red) 2x15               PT Short Term Goals - 03/27/17 1050      PT SHORT TERM GOAL #2   Title  ability to get in and out of bed with correct body mechanics with pain decreased >/= 25%    Time  4    Period  Weeks    Status  Achieved      PT SHORT TERM GOAL #3   Title  understand correct posture to reduce strain on back and decrease pain    Time  4  Period  Weeks    Status  Achieved      PT SHORT TERM GOAL #4   Title  understand information on osteoporosis and do's and do nots of osteoporosis    Time  4    Period  Weeks    Status  Achieved        PT Long Term Goals - 05/23/17 1407      PT LONG TERM GOAL #4   Title  patient reports he walks with increased steadiness >/= 50% due to improve strength and posture    Baseline  40% steadier on level and unlevel surface    Time  8    Period  Weeks    Status  On-going about the same            Plan - 06/04/17 1402    Clinical Impression Statement  Pt still reports he does not "feel" any steadier despite very high level balance exercise performed in the clinic without any LOB. Most challenging is the modified Warrior 3 with one foot on the BOSU.     Rehab Potential  Good    Clinical Impairments Affecting Rehab Potential  compression fracture 2013; osteoporosis; T11 compression fracture with scoliosis and spondylosis; peripheral neuropathy    PT Frequency  2x / week    PT Duration  8 weeks    PT Treatment/Interventions  Electrical Stimulation;Moist Heat;Ultrasound;Therapeutic activities;Therapeutic exercise;Neuromuscular re-education;Patient/family education;Passive range of  motion;Manual techniques;Dry needling;Energy conservation    PT Next Visit Plan  continue with balance, postural strength, building endurance and hip flexion ROM exercises.    Consulted and Agree with Plan of Care  Patient       Patient will benefit from skilled therapeutic intervention in order to improve the following deficits and impairments:  Abnormal gait, Decreased range of motion, Difficulty walking, Increased fascial restricitons, Decreased activity tolerance, Decreased endurance, Pain, Decreased mobility, Decreased strength  Visit Diagnosis: Muscle weakness (generalized)  Abnormal posture  Other abnormalities of gait and mobility     Problem List Patient Active Problem List   Diagnosis Date Noted  . Barrett's esophagus 05/20/2013  . HTN (hypertension) 05/20/2013  . GENERALIZED OSTEOARTHROSIS UNSPECIFIED SITE 06/10/2008  . EPIGASTRIC PAIN 06/10/2008  . GASTRIC ULCER, HX OF 06/10/2008  . INTERNAL HEMORRHOIDS 06/08/2008    Lariza Cothron, PTA 06/04/2017, 2:42 PM  [facility] [address] [place], [place], [address] Phone: [phone]   Fax:  [phone]  Name: MICHIO THIER MRN: [id number] Date of Birth: 17-Oct-1932

## 2017-06-05 DIAGNOSIS — I10 Essential (primary) hypertension: Secondary | ICD-10-CM | POA: Diagnosis not present

## 2017-06-05 DIAGNOSIS — K2270 Barrett's esophagus without dysplasia: Secondary | ICD-10-CM | POA: Diagnosis not present

## 2017-06-05 DIAGNOSIS — Z6829 Body mass index (BMI) 29.0-29.9, adult: Secondary | ICD-10-CM | POA: Diagnosis not present

## 2017-06-05 DIAGNOSIS — R3911 Hesitancy of micturition: Secondary | ICD-10-CM | POA: Diagnosis not present

## 2017-06-05 DIAGNOSIS — N183 Chronic kidney disease, stage 3 (moderate): Secondary | ICD-10-CM | POA: Diagnosis not present

## 2017-06-05 DIAGNOSIS — E7849 Other hyperlipidemia: Secondary | ICD-10-CM | POA: Diagnosis not present

## 2017-06-05 DIAGNOSIS — R7301 Impaired fasting glucose: Secondary | ICD-10-CM | POA: Diagnosis not present

## 2017-06-05 DIAGNOSIS — M810 Age-related osteoporosis without current pathological fracture: Secondary | ICD-10-CM | POA: Diagnosis not present

## 2017-06-05 DIAGNOSIS — G2581 Restless legs syndrome: Secondary | ICD-10-CM | POA: Diagnosis not present

## 2017-06-05 DIAGNOSIS — G6289 Other specified polyneuropathies: Secondary | ICD-10-CM | POA: Diagnosis not present

## 2017-06-05 DIAGNOSIS — E559 Vitamin D deficiency, unspecified: Secondary | ICD-10-CM | POA: Diagnosis not present

## 2017-06-05 DIAGNOSIS — Z0000 Encounter for general adult medical examination without abnormal findings: Secondary | ICD-10-CM | POA: Diagnosis not present

## 2017-06-06 ENCOUNTER — Encounter: Payer: Self-pay | Admitting: Physical Therapy

## 2017-06-06 ENCOUNTER — Ambulatory Visit: Payer: Medicare Other | Admitting: Physical Therapy

## 2017-06-06 DIAGNOSIS — R293 Abnormal posture: Secondary | ICD-10-CM

## 2017-06-06 DIAGNOSIS — M6281 Muscle weakness (generalized): Secondary | ICD-10-CM

## 2017-06-06 DIAGNOSIS — R2689 Other abnormalities of gait and mobility: Secondary | ICD-10-CM | POA: Diagnosis not present

## 2017-06-06 NOTE — Therapy (Signed)
[facility] [address] [place], [place], [address] Phone: [phone]   Fax:  [phone]  Physical Therapy Treatment  Patient Details  Name: Cameron Todd MRN: [id number] Date of Birth: Sep 10, 1932 Referring Provider: Dr. Leanna Battles   Encounter Date: 06/06/2017  PT End of Session - 06/06/17 1401    Visit Number  22    Number of Visits  30    Date for PT Re-Evaluation  07/03/17    Authorization Type  Medicare g-code on 20th visit; use KX modifier    PT Start Time  9509    PT Stop Time  1443    PT Time Calculation (min)  44 min    Activity Tolerance  Patient tolerated treatment well    Behavior During Therapy  [facility] for tasks assessed/performed       Past Medical History:  Diagnosis Date  . Barrett's esophagus   . Compression fracture of spine ([place])    T 10  . Diabetes ([place])   . Elevated cholesterol   . Gastric ulcer 1990  . History of colon polyps     Past Surgical History:  Procedure Laterality Date  . NO PAST SURGERIES    . UPPER GASTROINTESTINAL ENDOSCOPY      There were no vitals filed for this visit.  Subjective Assessment - 06/06/17 1403    Subjective  Better day today.    Currently in Pain?  No/denies    Multiple Pain Sites  No                      [facility] Adult PT Treatment/Exercise - 06/06/17 0001      Neuro Re-ed    Neuro Re-ed Details   Balance ex on mini tramp, floor ladder, and BOSU single leg balance one foot on/one foot on floor like Warrior in yoga Standing beach ball batting 2 min, light CGA      Knee/Hip Exercises: Aerobic   Nustep  L3 10 min PTA present      Knee/Hip Exercises: Standing   Heel Raises  Both;20 reps    Heel Raises Limitations  on mini tramp    Hip Flexion  Both;20 reps;Knee bent    Hip Flexion Limitations  on mini tramp    Walking with Sports Cord  25# forward 10x, backwards 10x, then 5x sidestepping bil light CGA, postural cues      Knee/Hip Exercises: Seated   Long Arc Quad  Strengthening;Both;2 sets;10 reps;Weights    Long Arc Quad Weight  4 lbs.      Shoulder Exercises: Seated   Theraband Level (Shoulder Horizontal ABduction)  Level 2 (Red) 2x15               PT Short Term Goals - 03/27/17 1050      PT SHORT TERM GOAL #2   Title  ability to get in and out of bed with correct body mechanics with pain decreased >/= 25%    Time  4    Period  Weeks    Status  Achieved      PT SHORT TERM GOAL #3   Title  understand correct posture to reduce strain on back and decrease pain    Time  4    Period  Weeks    Status  Achieved      PT SHORT TERM GOAL #4   Title  understand information on osteoporosis and do's and do nots of osteoporosis  Time  4    Period  Weeks    Status  Achieved        PT Long Term Goals - 05/23/17 1407      PT LONG TERM GOAL #4   Title  patient reports he walks with increased steadiness >/= 50% due to improve strength and posture    Baseline  40% steadier on level and unlevel surface    Time  8    Period  Weeks    Status  On-going about the same            Plan - 06/06/17 1402    Clinical Impression Statement  Pt reporting "the same" in regards to his overall feeling of steadiness. Continues to perform well in the clinic with his balance exercises.     Rehab Potential  Good    Clinical Impairments Affecting Rehab Potential  compression fracture 2013; osteoporosis; T11 compression fracture with scoliosis and spondylosis; peripheral neuropathy    PT Frequency  2x / week    PT Duration  8 weeks    PT Treatment/Interventions  Electrical Stimulation;Moist Heat;Ultrasound;Therapeutic activities;Therapeutic exercise;Neuromuscular re-education;Patient/family education;Passive range of motion;Manual techniques;Dry needling;Energy conservation    PT Next Visit Plan  Probable DC next visit    Consulted and Agree with Plan of Care  Patient       Patient will benefit from  skilled therapeutic intervention in order to improve the following deficits and impairments:  Abnormal gait, Decreased range of motion, Difficulty walking, Increased fascial restricitons, Decreased activity tolerance, Decreased endurance, Pain, Decreased mobility, Decreased strength  Visit Diagnosis: Muscle weakness (generalized)  Abnormal posture  Other abnormalities of gait and mobility     Problem List Patient Active Problem List   Diagnosis Date Noted  . Barrett's esophagus 05/20/2013  . HTN (hypertension) 05/20/2013  . GENERALIZED OSTEOARTHROSIS UNSPECIFIED SITE 06/10/2008  . EPIGASTRIC PAIN 06/10/2008  . GASTRIC ULCER, HX OF 06/10/2008  . INTERNAL HEMORRHOIDS 06/08/2008    Timmia Cogburn, PTA 06/06/2017, 2:45 PM  [facility] [address], [place], [place], [address] Phone: [phone]   Fax:  [phone]  Name: Cameron Todd MRN: [id number] Date of Birth: 11-02-1932

## 2017-06-15 DIAGNOSIS — Z1212 Encounter for screening for malignant neoplasm of rectum: Secondary | ICD-10-CM | POA: Diagnosis not present

## 2017-07-02 ENCOUNTER — Ambulatory Visit: Payer: Medicare Other | Admitting: Physical Therapy

## 2017-07-12 ENCOUNTER — Encounter: Payer: Self-pay | Admitting: Physical Therapy

## 2017-07-12 ENCOUNTER — Ambulatory Visit: Payer: Medicare Other | Attending: Internal Medicine | Admitting: Physical Therapy

## 2017-07-12 DIAGNOSIS — R293 Abnormal posture: Secondary | ICD-10-CM | POA: Diagnosis not present

## 2017-07-12 DIAGNOSIS — R2689 Other abnormalities of gait and mobility: Secondary | ICD-10-CM | POA: Diagnosis not present

## 2017-07-12 DIAGNOSIS — M6281 Muscle weakness (generalized): Secondary | ICD-10-CM | POA: Diagnosis not present

## 2017-07-12 NOTE — Therapy (Signed)
[facility] [address], [place], [place], [address] Phone: [phone]   Fax:  [phone]  Physical Therapy Treatment  Patient Details  Name: Cameron Todd MRN: [id number] Date of Birth: 01-25-1933 Referring Provider: Dr. Leanna Battles   Encounter Date: 07/12/2017  PT End of Session - 07/12/17 1510    Visit Number  23    Number of Visits  30    Date for PT Re-Evaluation  07/03/17    Authorization Type  Medicare g-code on 20th visit; use KX modifier    PT Start Time  7703    PT Stop Time  1508    PT Time Calculation (min)  23 min    Activity Tolerance  Patient tolerated treatment well    Behavior During Therapy  [facility] for tasks assessed/performed       Past Medical History:  Diagnosis Date  . Barrett's esophagus   . Compression fracture of spine ([place])    T 10  . Diabetes ([place])   . Elevated cholesterol   . Gastric ulcer 1990  . History of colon polyps     Past Surgical History:  Procedure Laterality Date  . NO PAST SURGERIES    . UPPER GASTROINTESTINAL ENDOSCOPY      There were no vitals filed for this visit.  Subjective Assessment - 07/12/17 1444    Subjective  My knee feels like it will give way and happening more often. I am standing taller. My back hurts with rainny weather.     Patient Stated Goals  reduce pain    Currently in Pain?  Yes    Pain Score  5     Pain Location  Back    Pain Orientation  Right    Pain Descriptors / Indicators  Dull    Pain Type  Chronic pain    Pain Onset  In the past 7 days    Pain Frequency  Intermittent    Aggravating Factors   weather    Pain Relieving Factors  brace    Multiple Pain Sites  No         [facility] PT Assessment - 07/12/17 0001      Assessment   Medical Diagnosis  M54.9 back pain    Referring Provider  Dr. Leanna Battles    Onset Date/Surgical Date  12/11/16    Prior Therapy  none      Precautions   Precautions  Other (comment)    Precaution Comments  osteoporosis      Restrictions   Weight Bearing Restrictions  No      [place] residence      Prior Function   Level of Independence  Independent      Cognition   Overall Cognitive Status  Within Functional Limits for tasks assessed      Observation/Other Assessments   Observations  stand at wall with measure 15cm with buttocks against wall     Focus on Therapeutic Outcomes (FOTO)   25%      Posture/Postural Control   Posture/Postural Control  Postural limitations    Postural Limitations  Rounded Shoulders;Forward head;Increased thoracic kyphosis;Flexed trunk;Posterior pelvic tilt    Posture Comments  lower abdomen pouches out      AROM   Overall AROM Comments  Did not test trunk ROM due to history      PROM   Left Hip Flexion  100  Strength   Right Hip Flexion  5/5    Right Hip Extension  5/5    Right Hip ABduction  5/5    Left Hip Flexion  5/5    Left Hip Extension  5/5    Left Hip ABduction  5/5      Right Hip   Right Hip Flexion  100      Ambulation/Gait   Gait Comments  up and down stairs with no handrail alternating steps      Standardized Balance Assessment   Five times sit to stand comments   12                  [facility] Adult PT Treatment/Exercise - 07/12/17 0001      Knee/Hip Exercises: Aerobic   Nustep  L3 7 min PTA present             PT Education - 07/12/17 1509    Education provided  Yes    Education Details  reviewed HEP and patient is independent with HEP and goes to the gym 2-3 times per week.     Person(s) Educated  Patient    Methods  Explanation    Comprehension  Verbalized understanding;Returned demonstration       PT Short Term Goals - 03/27/17 1050      PT SHORT TERM GOAL #2   Title  ability to get in and out of bed with correct body mechanics with pain decreased >/= 25%    Time  4    Period  Weeks    Status  Achieved      PT SHORT TERM GOAL #3    Title  understand correct posture to reduce strain on back and decrease pain    Time  4    Period  Weeks    Status  Achieved      PT SHORT TERM GOAL #4   Title  understand information on osteoporosis and do's and do nots of osteoporosis    Time  4    Period  Weeks    Status  Achieved        PT Long Term Goals - 07/12/17 1449      PT LONG TERM GOAL #1   Title  independent with HEP for postural strength    Time  8    Period  Weeks    Status  Achieved      PT LONG TERM GOAL #2   Title  stand erect with head away from wall </= 15 cm    Time  8    Period  Weeks    Status  Achieved      PT LONG TERM GOAL #3   Title  perform daily tasks including getting into bed with pain decreased >/= 50% due to improved posture    Time  8    Period  Weeks    Status  Achieved      PT LONG TERM GOAL #4   Title  patient reports he walks with increased steadiness >/= 50% due to improve strength and posture    Time  8    Period  Weeks    Status  Achieved      PT LONG TERM GOAL #5   Title  foto score limitation </= 25% limitation    Time  8    Period  Weeks    Status  Achieved            Plan -  07/12/17 1510    Clinical Impression Statement  Patient has met all of his goals.  Patient has 5/5 strength in bilateral legs.  Patient is standing taller and head is 15 cm away from the wall instead of 21 cm.  Patient is able to go from sit to stand in 12 seconds due to increased balanced.  Patient has increased back pain with the weather to 5/10.  Patient is still wearing his brace. Patient posture consists with forward head, increased thoracic kyphosis, decreased lumbar lordosis.  Patient is concerned with his knees giving way at times.  Bilateral knee strength is 5/5.  PT suggested to patient to call his primary doctor due to the knee givign way.  Patient is ready for discharge.     Rehab Potential  Good    Clinical Impairments Affecting Rehab Potential  compression fracture 2013;  osteoporosis; T11 compression fracture with scoliosis and spondylosis; peripheral neuropathy    PT Frequency  1x / week    PT Duration  2 weeks total of 1 visit due to coming after Cert date is over    PT Treatment/Interventions  Electrical Stimulation;Moist Heat;Ultrasound;Therapeutic activities;Therapeutic exercise;Neuromuscular re-education;Patient/family education;Passive range of motion;Manual techniques;Dry needling;Energy conservation    PT Next Visit Plan  1 time visit today and discharge him the same day    PT Home Exercise Plan  Current HEP    Consulted and Agree with Plan of Care  Patient       Patient will benefit from skilled therapeutic intervention in order to improve the following deficits and impairments:  Abnormal gait, Decreased range of motion, Difficulty walking, Increased fascial restricitons, Decreased activity tolerance, Decreased endurance, Pain, Decreased mobility, Decreased strength  Visit Diagnosis: Muscle weakness (generalized) - Plan: PT plan of care cert/re-cert  Abnormal posture - Plan: PT plan of care cert/re-cert  Other abnormalities of gait and mobility - Plan: PT plan of care cert/re-cert   G-Codes - 06/08/51 1515    Functional Assessment Tool Used (Outpatient Only)  FOTO score is 25% limitation    Functional Limitation  Other PT primary    Other PT Primary Goal Status (C8022)  At least 20 percent but less than 40 percent impaired, limited or restricted    Other PT Primary Discharge Status (V3612)  At least 20 percent but less than 40 percent impaired, limited or restricted       Problem List Patient Active Problem List   Diagnosis Date Noted  . Barrett's esophagus 05/20/2013  . HTN (hypertension) 05/20/2013  . GENERALIZED OSTEOARTHROSIS UNSPECIFIED SITE 06/10/2008  . EPIGASTRIC PAIN 06/10/2008  . GASTRIC ULCER, HX OF 06/10/2008  . INTERNAL HEMORRHOIDS 06/08/2008    Earlie Counts, PT 07/12/17 3:18 PM   [facility] [address], [place], [place], [address] Phone: [phone]   Fax:  [phone]  Name: ABHI MOCCIA MRN: [id number] Date of Birth: 1932-10-13  PHYSICAL THERAPY DISCHARGE SUMMARY  Visits from Start of Care: 23  Current functional level related to goals / functional outcomes: See above   Remaining deficits: See above.  Please assess patient complaint of his knees giving way. Both knee strength measured 5/5.    Education / Equipment: HEP Plan: Patient agrees to discharge.  Patient goals were met. Patient is being discharged due to meeting the stated rehab goals.  Thank you for the referral. Earlie Counts, PT 07/12/17 3:18 PM  ?????

## 2017-08-30 DIAGNOSIS — N3281 Overactive bladder: Secondary | ICD-10-CM | POA: Diagnosis not present

## 2017-08-30 DIAGNOSIS — N500 Atrophy of testis: Secondary | ICD-10-CM | POA: Diagnosis not present

## 2017-08-30 DIAGNOSIS — N401 Enlarged prostate with lower urinary tract symptoms: Secondary | ICD-10-CM | POA: Diagnosis not present

## 2017-09-20 DIAGNOSIS — N3281 Overactive bladder: Secondary | ICD-10-CM | POA: Diagnosis not present

## 2017-10-30 ENCOUNTER — Encounter: Payer: Self-pay | Admitting: Gastroenterology

## 2017-11-04 IMAGING — MR MR THORACIC SPINE WO/W CM
4 of 9 series · 22 of 48 positions shown · IV contrast (Multihance 14ml)
Comparison: None.

CLINICAL DATA: Osteoporosis.  Low back pain.

Creatinine was obtained on site at [HOSPITAL] at [HOSPITAL].Results: Creatinine 1.4 mg/dL.
EXAM:
MRI THORACIC WITHOUT AND WITH CONTRAST
TECHNIQUE: Multiplanar and multiecho pulse sequences of the thoracic spine were
obtained without and with intravenous contrast.
CONTRAST:  14mL MULTIHANCE GADOBENATE DIMEGLUMINE 529 MG/ML IV SOLN

[Series 17: T1 · sagittal · 3.0mm · 0.76mm/px · 4 of 19 slices shown (1 of 2)]
[im 1/19]
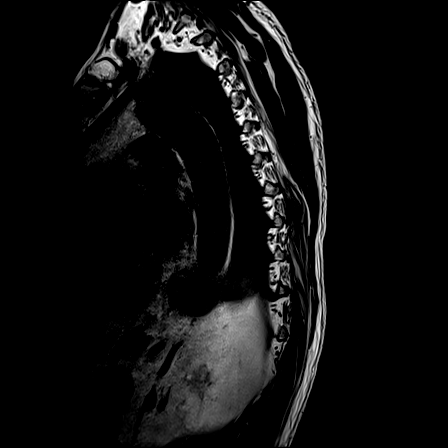
[im 7/19]
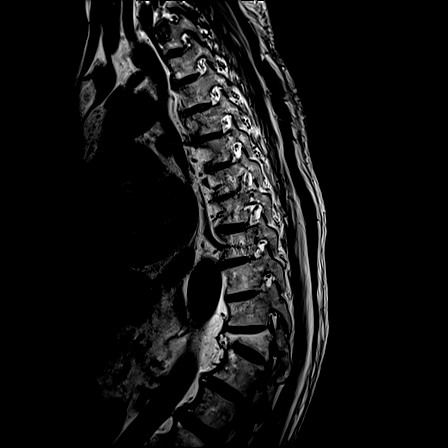
[im 13/19]
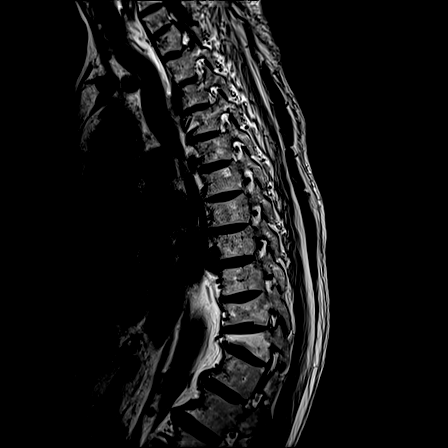
[im 19/19]
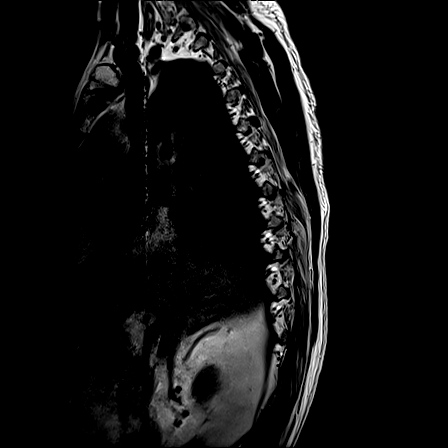

[Series 19: T2 · axial · 4.0mm · 0.28mm/px · z∈[-241,+8]mm · 8 of 52 slices shown (1 of 2)]
[im 1/52]
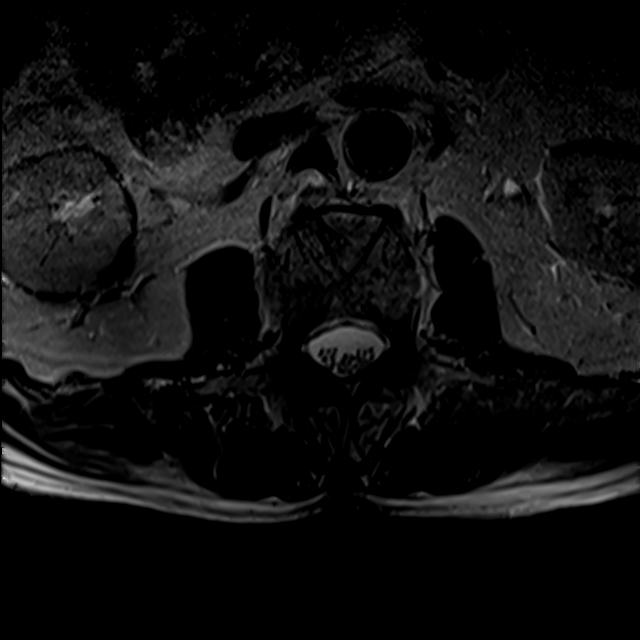
[im 8/52]
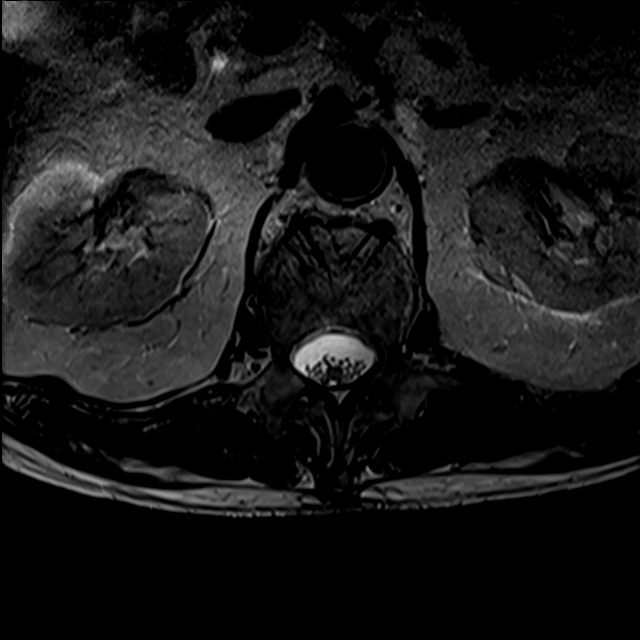
[im 15/52]
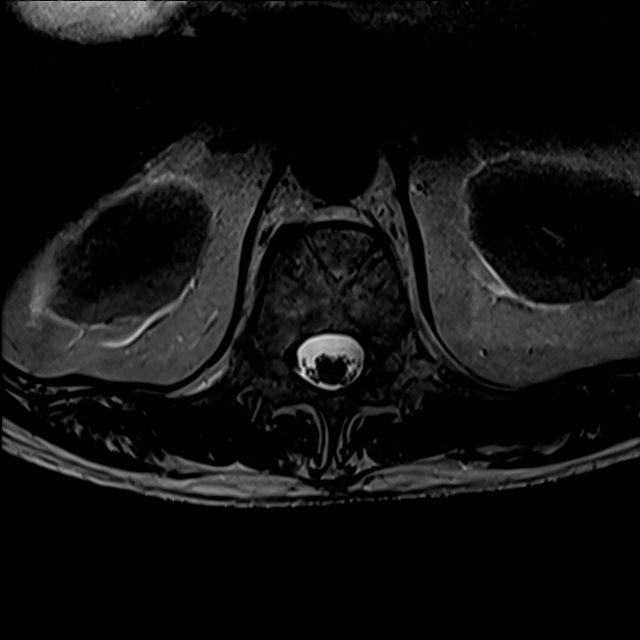
[im 22/52]
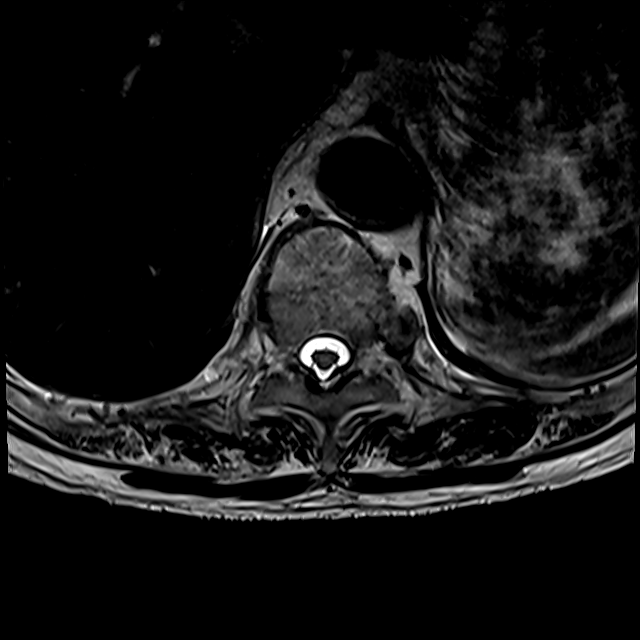
[im 30/52]
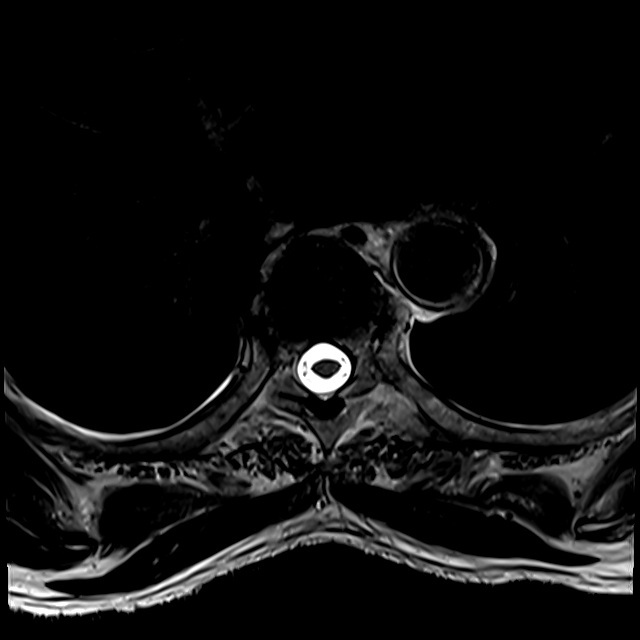
[im 37/52]
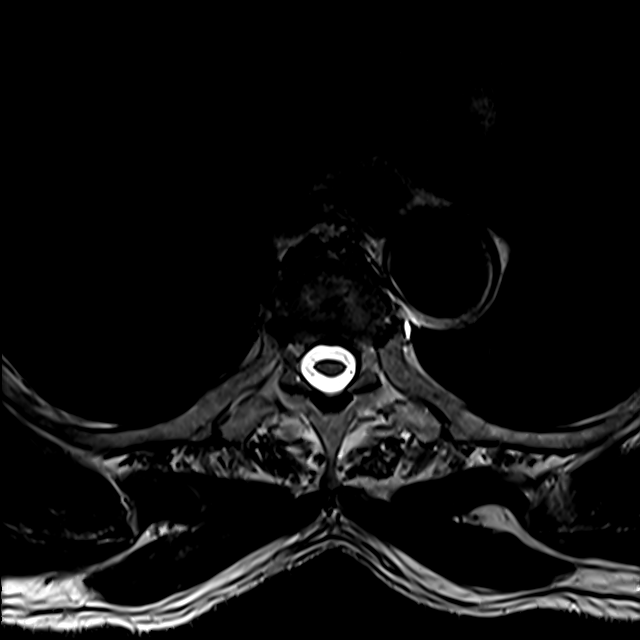
[im 44/52]
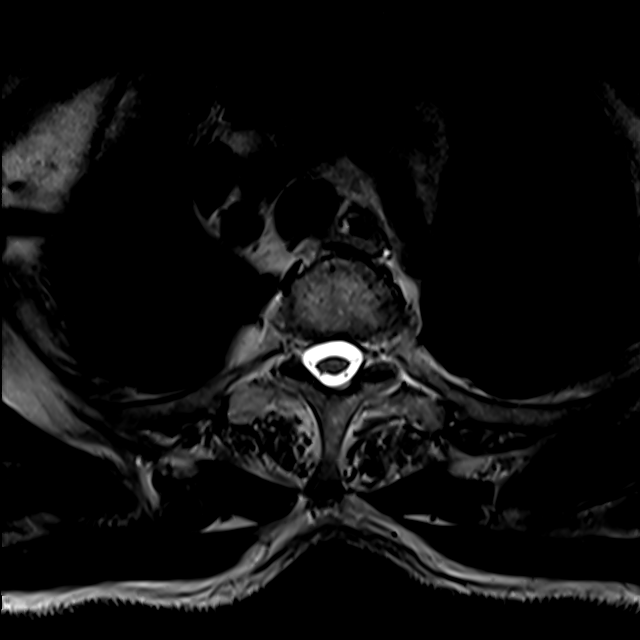
[im 52/52]
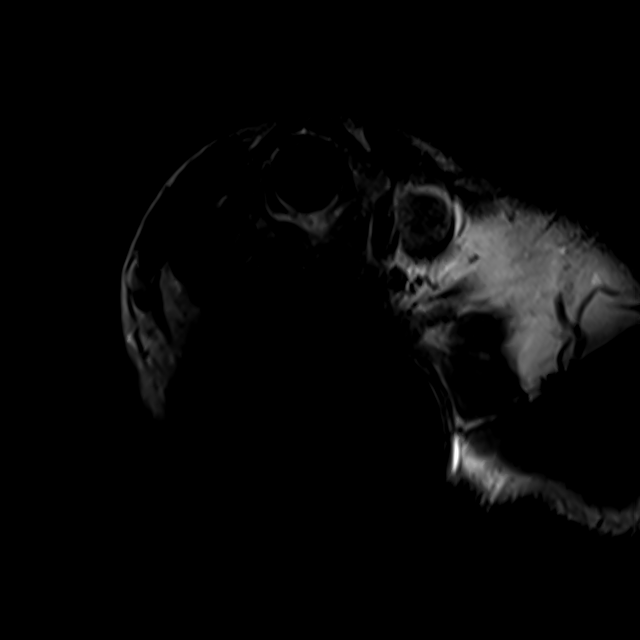

[Series 23: T1 · axial · non-contrast · 4.0mm · 0.56mm/px · z∈[-241,-40]mm · 7 of 52 slices shown (2 of 2)]
[im 1/52]
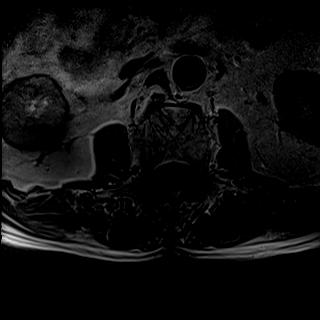
[im 8/52]
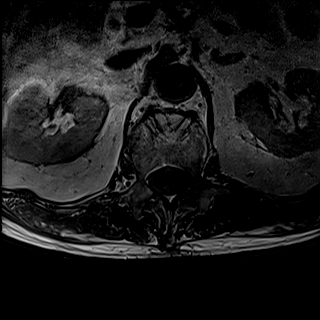
[im 15/52]
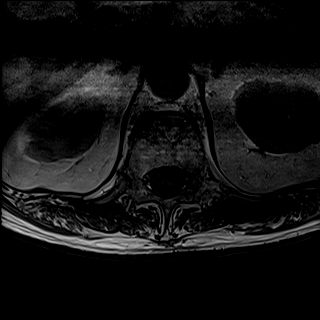
[im 22/52]
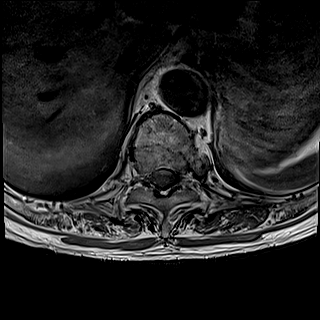
[im 30/52]
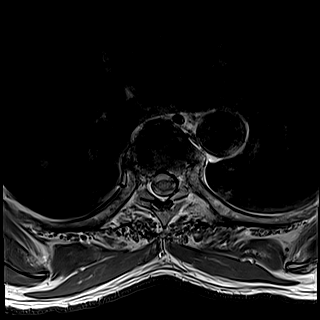
[im 37/52]
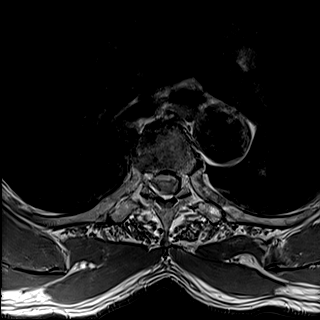
[im 44/52]
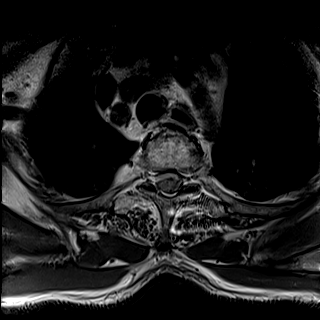

[Series 24: T2 · sagittal · 3.0mm · 0.76mm/px · 3 of 19 slices shown (2 of 2)]
[im 1/19]
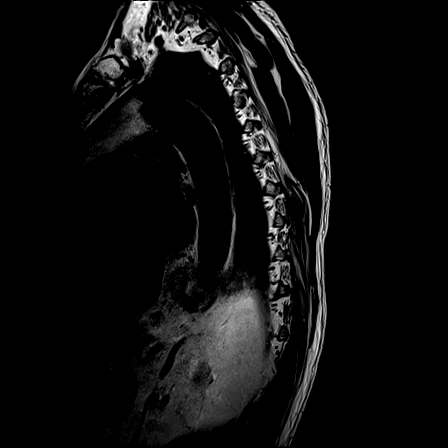
[im 10/19]
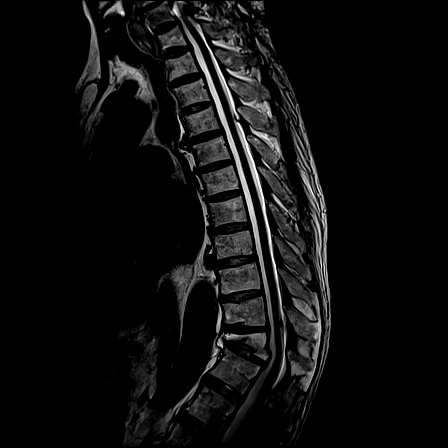
[im 19/19]
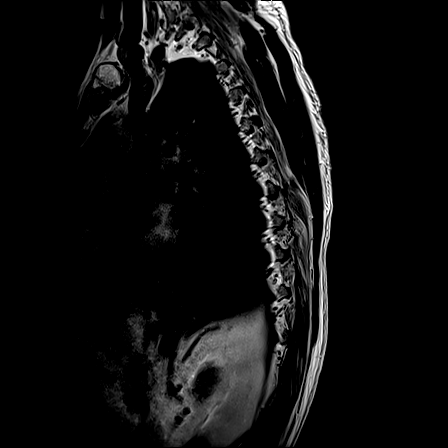

[22 of 48 positions shown; findings below may reference images not displayed]

FINDINGS: MRI THORACIC SPINE FINDINGS

Alignment:  Normal

Vertebrae: Severe chronic compression fracture T11. 50 degrees of
kyphosis at T11 related to fracture. No acute fracture or mass. No
bone marrow edema.

Cord:  No cord compression.  No cord signal abnormality.

Paraspinal and other soft tissues: Negative

Disc levels:

Negative for spinal stenosis.  No significant disc degeneration.
IMPRESSION: Negative for spinal stenosis. No significant disc degeneration. Mild
retropulsion of T11 into the canal due to chronic fracture without
spinal stenosis. 50 degree of kyphosis at T11 related to the
fracture.

## 2017-11-27 ENCOUNTER — Other Ambulatory Visit (HOSPITAL_COMMUNITY): Payer: Self-pay | Admitting: *Deleted

## 2017-11-28 ENCOUNTER — Ambulatory Visit (HOSPITAL_COMMUNITY)
Admission: RE | Admit: 2017-11-28 | Discharge: 2017-11-28 | Disposition: A | Discharge status: Home / Self Care | Payer: Medicare Other | Source: Ambulatory Visit | Attending: Internal Medicine | Admitting: Internal Medicine

## 2017-11-28 DIAGNOSIS — M810 Age-related osteoporosis without current pathological fracture: Secondary | ICD-10-CM | POA: Insufficient documentation

## 2017-11-28 MED ORDER — DENOSUMAB 60 MG/ML ~~LOC~~ SOSY
60.0000 mg | PREFILLED_SYRINGE | Freq: Once | SUBCUTANEOUS | Status: AC
  Administered 2017-11-28: 14:00:00 60 mg via SUBCUTANEOUS
  Filled 2017-11-28: qty 1

## 2018-01-28 ENCOUNTER — Encounter: Payer: Self-pay | Admitting: Internal Medicine

## 2018-03-27 ENCOUNTER — Encounter: Payer: Self-pay | Admitting: *Deleted

## 2018-04-09 ENCOUNTER — Ambulatory Visit (INDEPENDENT_AMBULATORY_CARE_PROVIDER_SITE_OTHER): Payer: Medicare Other | Admitting: Internal Medicine

## 2018-04-09 ENCOUNTER — Encounter (INDEPENDENT_AMBULATORY_CARE_PROVIDER_SITE_OTHER): Payer: Self-pay

## 2018-04-09 ENCOUNTER — Encounter: Payer: Self-pay | Admitting: Internal Medicine

## 2018-04-09 VITALS — BP 122/60 | HR 80 | Ht 62.25 in | Wt 161.0 lb

## 2018-04-09 DIAGNOSIS — Z86010 Personal history of colonic polyps: Secondary | ICD-10-CM

## 2018-04-09 DIAGNOSIS — K219 Gastro-esophageal reflux disease without esophagitis: Secondary | ICD-10-CM

## 2018-04-09 DIAGNOSIS — K2270 Barrett's esophagus without dysplasia: Secondary | ICD-10-CM

## 2018-04-09 MED ORDER — OMEPRAZOLE 40 MG PO CPDR: 40.0000 mg | DELAYED_RELEASE_CAPSULE | Freq: Two times a day (BID) | ORAL | 1 refills | Status: AC

## 2018-04-09 NOTE — Patient Instructions (Addendum)
You have been scheduled for an endoscopy. Please follow written instructions given to you at your visit today. If you use inhalers (even only as needed), please bring them with you on the day of your procedure. Your physician has requested that you go to www.startemmi.com and enter the access code given to you at your visit today. This web site gives a general overview about your procedure. However, you should still follow specific instructions given to you by our office regarding your preparation for the procedure. _______________________________________________________________________ We have sent the following prescriptions to your mail in pharmacy: Omeprazole 40 mg every morning, every evening as needed (script is written for twice daily)  If you have not heard from your mail in pharmacy within 1 week or if you have not received your medication in the mail, please contact us at [phone] so we may find out why. _______________________________________________________________________ If you are age 28 or older, your body mass index should be between 23-30. Your Body mass index is 29.21 kg/m. If this is out of the aforementioned range listed, please consider follow up with your Primary Care Provider.  If you are age 48 or younger, your body mass index should be between 19-25. Your Body mass index is 29.21 kg/m. If this is out of the aformentioned range listed, please consider follow up with your Primary Care Provider.  ___________________________________________________________________    Of note: Patient requested that we cancel script to [facility] upon leaving for omeprazole. He wants to get over the counter. Per Jackelyn Poling at [facility], the omeprazole capsules would be free but she has cancelled the prescription. 04/09/18 12:16 pm-dnsmith.

## 2018-04-09 NOTE — Progress Notes (Signed)
Patient ID: Cameron Todd, male   DOB: 11/26/1932, 84 y.o.   MRN: [id number] HPI: Cameron Todd is an 84 year old male with a past medical history of nondysplastic Barrett's esophagus, GERD, remote colon polyps, diabetes and hypertension who is seen to discuss reflux disease and consider Barrett's surveillance.  He is here today with his wife.  He was previously managed by Dr. Delfin Edis prior to her retirement.  He reports that he is doing well.  He is using omeprazole 40 mg every morning and when he remembers 40 mg in the evening.  When he takes omeprazole twice daily he has no reflux symptoms whatsoever.  Heartburn is well controlled.  No dysphagia or odynophagia.  No abdominal pain, nausea or vomiting.  Bowel habits been regular for him.  Occasionally when he forgets the second daily dose of omeprazole he will feel some mild indigestion type symptom.  No weight loss.  In 2014 he had a colonoscopy with 2 diminutive flat polyps removed 1 of which was adenomatous. In 2016 he had nondysplastic short segment Barrett's esophagus which was biopsied.  Past Medical History:  Diagnosis Date  . Arthritis   . Barrett's esophagus   . Compression fracture of spine ([place])    T 10  . Diabetes ([place])   . Elevated cholesterol   . Gastric ulcer 1990  . Hemorrhoids   . History of colon polyps   . Hypertension   . Tubular adenoma of colon     Past Surgical History:  Procedure Laterality Date  . NO PAST SURGERIES    . UPPER GASTROINTESTINAL ENDOSCOPY      Outpatient Medications Prior to Visit  Medication Sig Dispense Refill  . acetaminophen-codeine (TYLENOL #4) 300-60 MG per tablet Take 1-2 tablets by mouth daily.     Marland Kitchen amLODipine (NORVASC) 2.5 MG tablet Take 2.5 mg by mouth daily.    Marland Kitchen CALCIUM CITRATE PO Take 500 mg by mouth 2 (two) times daily.    . CRESTOR 10 MG tablet Take 10 mg by mouth daily.     Marland Kitchen denosumab (PROLIA) 60 MG/ML SOLN injection Inject 60 mg into the skin every 6 (six) months.  Administer in upper arm, thigh, or abdomen    . gabapentin (NEURONTIN) 300 MG capsule Take 300 mg by mouth 2 (two) times daily.     Marland Kitchen ipratropium (ATROVENT) 0.03 % nasal spray Place 2 sprays into both nostrils as needed.     . magnesium oxide (MAG-OX) 400 MG tablet Take 400 mg by mouth daily.    . methocarbamol (ROBAXIN) 500 MG tablet Take 500 mg by mouth 2 (two) times daily.     . mirabegron ER (MYRBETRIQ) 50 MG TB24 tablet Take 50 mg by mouth daily.    . Multiple Vitamin (MULTIVITAMIN) tablet Take 1 tablet by mouth daily.    . pioglitazone (ACTOS) 30 MG tablet Take 30 mg by mouth daily.     . tamsulosin (FLOMAX) 0.4 MG CAPS capsule Take 0.4 mg by mouth.    . Vitamin D, Ergocalciferol, (DRISDOL) 50000 UNITS CAPS capsule Take 50,000 Units by mouth every 7 (seven) days.    Marland Kitchen amLODipine (NORVASC) 5 MG tablet Take 5 mg by mouth daily.    Marland Kitchen omeprazole (PRILOSEC) 40 MG capsule Take 1 capsule (40 mg total) by mouth 2 (two) times daily. 180 capsule 3  . hydrocortisone (ANUSOL-HC) 25 MG suppository Place 1 suppository (25 mg total) rectally at bedtime as needed for hemorrhoids or itching. (Patient not taking: Reported  on 11/04/2014) 12 suppository 2  . calcium carbonate (OSCAL) 1500 (600 Ca) MG TABS tablet Take by mouth 2 (two) times daily with a meal.    . carbidopa-levodopa (SINEMET IR) 10-100 MG tablet Take 1 tablet by mouth 3 (three) times daily.    . clobetasol cream (TEMOVATE) 8.93 % Apply 1 application topically 2 (two) times daily.    Marland Kitchen KRILL OIL OMEGA-3 PO Take 1 capsule by mouth daily. EPA,DHA,Phospolipid    . Lutein-Zeaxanthin 25-5 MG CAPS Take 1 capsule by mouth daily.    . magnesium 30 MG tablet Take 30 mg by mouth 2 (two) times daily.    . Multiple Minerals-Vitamins (CALCIUM CITRATE PLUS/MAGNESIUM PO) Take 1 capsule by mouth daily.    . mupirocin ointment (BACTROBAN) 2 % Place 1 application into the nose 2 (two) times daily.    . polyethylene glycol (MIRALAX / GLYCOLAX) packet Take 17 g by  mouth daily.     No facility-administered medications prior to visit.     No Known Allergies  Family History  Problem Relation Age of Onset  . Diabetes Sister   . Colon cancer Neg Hx     Social History   Tobacco Use  . Smoking status: Never Smoker  . Smokeless tobacco: Never Used  Substance Use Topics  . Alcohol use: No  . Drug use: No    ROS: As per history of present illness, otherwise negative  BP 122/60 (BP Location: Left Arm, Patient Position: Sitting, Cuff Size: Normal)   Pulse 80   Ht 5' 2.25" (1.581 m) Comment: height measured without shoes  Wt 161 lb (73 kg)   BMI 29.21 kg/m  Gen: awake, alert, NAD HEENT: anicteric, op clear CV: RRR, no mrg Pulm: CTA b/l Abd: soft, NT/ND, +BS throughout Ext: no c/c/e Neuro: nonfocal    ASSESSMENT/PLAN: 84 year old male with a past medical history of nondysplastic Barrett's esophagus, GERD, remote colon polyps, diabetes and hypertension who is seen to discuss reflux disease and consider Barrett's surveillance.  1. GERD with nondysplastic short segment Barrett's esophagus --disease is well controlled.  I have recommended that we continue at least once daily omeprazole 40 mg daily.  He can use the second evening dose on an as-needed basis as symptoms warrant.  We discussed surveillance endoscopy the benefits and relatively low risk of repeat surveillance endoscopy.  After this discussion he wishes to proceed.  We will schedule him for surveillance endoscopy for his Barrett's esophagus.  2.  History of colon polyps --diminutive adenoma.  After discussing surveillance colonoscopy, given the prep involved with this procedure and the low risk of future colon cancer, decision made together to discontinue colon polyp surveillance at this time.     [id number], Quillian Quince, [place], [place] [id number]

## 2018-05-16 ENCOUNTER — Ambulatory Visit (AMBULATORY_SURGERY_CENTER): Payer: Medicare Other | Admitting: Internal Medicine

## 2018-05-16 ENCOUNTER — Encounter: Payer: Self-pay | Admitting: Internal Medicine

## 2018-05-16 VITALS — BP 121/64 | HR 72 | Temp 98.4°F | Resp 11 | Ht 62.25 in | Wt 161.0 lb

## 2018-05-16 DIAGNOSIS — E119 Type 2 diabetes mellitus without complications: Secondary | ICD-10-CM | POA: Diagnosis not present

## 2018-05-16 DIAGNOSIS — K219 Gastro-esophageal reflux disease without esophagitis: Secondary | ICD-10-CM | POA: Diagnosis not present

## 2018-05-16 DIAGNOSIS — K449 Diaphragmatic hernia without obstruction or gangrene: Secondary | ICD-10-CM

## 2018-05-16 DIAGNOSIS — E669 Obesity, unspecified: Secondary | ICD-10-CM | POA: Diagnosis not present

## 2018-05-16 DIAGNOSIS — K2270 Barrett's esophagus without dysplasia: Secondary | ICD-10-CM | POA: Diagnosis not present

## 2018-05-16 DIAGNOSIS — K222 Esophageal obstruction: Secondary | ICD-10-CM

## 2018-05-16 DIAGNOSIS — E785 Hyperlipidemia, unspecified: Secondary | ICD-10-CM | POA: Diagnosis not present

## 2018-05-16 DIAGNOSIS — I10 Essential (primary) hypertension: Secondary | ICD-10-CM | POA: Diagnosis not present

## 2018-05-16 MED ORDER — SODIUM CHLORIDE 0.9 % IV SOLN: 500.0000 mL | Freq: Once | INTRAVENOUS | Status: DC

## 2018-05-16 NOTE — Op Note (Signed)
[facility] Patient Name: Cameron Todd Procedure Date: 05/16/2018 10:03 AM MRN: [id number] Endoscopist: Jerene Bears , MD Age: 84 Referring MD:  Date of Birth: 30-Apr-1933 Gender: Male Account #: [id number] Procedure:                Upper GI endoscopy Indications:              Follow-up of Barrett's esophagus, last EGD 3 years Medicines:                Monitored Anesthesia Care Procedure:                Pre-Anesthesia Assessment:                           - Prior to the procedure, a History and Physical                            was performed, and patient medications and                            allergies were reviewed. The patient's tolerance of                            previous anesthesia was also reviewed. The risks                            and benefits of the procedure and the sedation                            options and risks were discussed with the patient.                            All questions were answered, and informed consent                            was obtained. Prior Anticoagulants: The patient has                            taken no previous anticoagulant or antiplatelet                            agents. ASA Grade Assessment: II - A patient with                            mild systemic disease. After reviewing the risks                            and benefits, the patient was deemed in                            satisfactory condition to undergo the procedure.                           After obtaining informed consent, the endoscope was  passed under direct vision. Throughout the                            procedure, the patient's blood pressure, pulse, and                            oxygen saturations were monitored continuously. The                            Endoscope was introduced through the mouth, and                            advanced to the second part of duodenum. The upper                            GI  endoscopy was accomplished without difficulty.                            The patient tolerated the procedure well. Scope In: Scope Out: Findings:                 A partial and non-obstructing Schatzki's ring was                            found at the gastroesophageal junction.                           A 1 cm hiatal hernia was present.                           There is no endoscopic evidence of Barrett's                            esophagus at the gastroesophageal junction.                           A small amount of food (residue) was found in the                            gastric body and in the gastric antrum. This likely                            indicates mild gastroparesis.                           The examined duodenum was normal. Complications:            No immediate complications. Estimated Blood Loss:     Estimated blood loss: none. Impression:               - Non-obstructing Schatzki's ring.                           - 1 cm hiatal hernia.                           -  A small amount of food (residue) in the stomach.                           - Normal examined duodenum.                           - No specimens collected. Recommendation:           - Patient has a contact number available for                            emergencies. The signs and symptoms of potential                            delayed complications were discussed with the                            patient. Return to normal activities tomorrow.                            Written discharge instructions were provided to the                            patient.                           - Resume previous diet.                           - Continue present medications.                           - No repeat upper endoscopy needed given lack of                            Barrett's esophagus today. Jerene Bears, MD 05/16/2018 10:27:42 AM This report has been signed electronically.

## 2018-05-16 NOTE — Patient Instructions (Signed)
YOU HAD AN ENDOSCOPIC PROCEDURE TODAY AT THE [facility]:   Refer to the procedure report that was given to you for any specific questions about what was found during the examination.  If the procedure report does not answer your questions, please call your gastroenterologist to clarify.  If you requested that your care partner not be given the details of your procedure findings, then the procedure report has been included in a sealed envelope for you to review at your convenience later.  YOU SHOULD EXPECT: Some feelings of bloating in the abdomen. Passage of more gas than usual.  Walking can help get rid of the air that was put into your GI tract during the procedure and reduce the bloating. If you had a lower endoscopy (such as a colonoscopy or flexible sigmoidoscopy) you may notice spotting of blood in your stool or on the toilet paper. If you underwent a bowel prep for your procedure, you may not have a normal bowel movement for a few days.  Please Note:  You might notice some irritation and congestion in your nose or some drainage.  This is from the oxygen used during your procedure.  There is no need for concern and it should clear up in a day or so.  SYMPTOMS TO REPORT IMMEDIATELY:   Following upper endoscopy (EGD)  Vomiting of blood or coffee ground material  New chest pain or pain under the shoulder blades  Painful or persistently difficult swallowing  New shortness of breath  Fever of 100F or higher  Black, tarry-looking stools  For urgent or emergent issues, a gastroenterologist can be reached at any hour by calling [phone].   DIET:  We do recommend a small meal at first, but then you may proceed to your regular diet.  Drink plenty of fluids but you should avoid alcoholic beverages for 24 hours.  ACTIVITY:  You should plan to take it easy for the rest of today and you should NOT DRIVE or use heavy machinery until tomorrow (because of the sedation medicines used  during the test).    FOLLOW UP: Our staff will call the number listed on your records the next business day following your procedure to check on you and address any questions or concerns that you may have regarding the information given to you following your procedure. If we do not reach you, we will leave a message.  However, if you are feeling well and you are not experiencing any problems, there is no need to return our call.  We will assume that you have returned to your regular daily activities without incident.  If any biopsies were taken you will be contacted by phone or by letter within the next 1-3 weeks.  Please call us at [phone] if you have not heard about the biopsies in 3 weeks.    SIGNATURES/CONFIDENTIALITY: You and/or your care partner have signed paperwork which will be entered into your electronic medical record.  These signatures attest to the fact that that the information above on your After Visit Summary has been reviewed and is understood.  Full responsibility of the confidentiality of this discharge information lies with you and/or your care-partner. 

## 2018-05-16 NOTE — Progress Notes (Signed)
Report to PACU, RN, vss, BBS= Clear.  

## 2018-05-17 ENCOUNTER — Encounter (HOSPITAL_COMMUNITY): Payer: Medicare Other

## 2018-05-17 ENCOUNTER — Telehealth: Payer: Self-pay

## 2018-05-17 NOTE — Telephone Encounter (Signed)
  Follow up Call-  Call back number 05/16/2018  Post procedure Call Back phone  # [phone]  Permission to leave phone message Yes  Some recent data might be hidden     Patient questions:  Do you have a fever, pain , or abdominal swelling? No. Pain Score  0 *  Have you tolerated food without any problems? Yes.    Have you been able to return to your normal activities? Yes.    Do you have any questions about your discharge instructions: Diet   No. Medications  No. Follow up visit  No.  Do you have questions or concerns about your Care? No.  Actions: * If pain score is 4 or above: No action needed, pain <4.

## 2018-05-31 ENCOUNTER — Other Ambulatory Visit (HOSPITAL_COMMUNITY): Payer: Self-pay | Admitting: *Deleted

## 2018-05-31 DIAGNOSIS — E7849 Other hyperlipidemia: Secondary | ICD-10-CM | POA: Diagnosis not present

## 2018-05-31 DIAGNOSIS — I10 Essential (primary) hypertension: Secondary | ICD-10-CM | POA: Diagnosis not present

## 2018-05-31 DIAGNOSIS — Z125 Encounter for screening for malignant neoplasm of prostate: Secondary | ICD-10-CM | POA: Diagnosis not present

## 2018-05-31 DIAGNOSIS — R7301 Impaired fasting glucose: Secondary | ICD-10-CM | POA: Diagnosis not present

## 2018-05-31 DIAGNOSIS — R82998 Other abnormal findings in urine: Secondary | ICD-10-CM | POA: Diagnosis not present

## 2018-05-31 DIAGNOSIS — E559 Vitamin D deficiency, unspecified: Secondary | ICD-10-CM | POA: Diagnosis not present

## 2018-06-04 ENCOUNTER — Ambulatory Visit (HOSPITAL_COMMUNITY)
Admission: RE | Admit: 2018-06-04 | Discharge: 2018-06-04 | Disposition: A | Discharge status: Home / Self Care | Payer: Medicare Other | Source: Ambulatory Visit | Attending: Internal Medicine | Admitting: Internal Medicine

## 2018-06-04 DIAGNOSIS — M810 Age-related osteoporosis without current pathological fracture: Secondary | ICD-10-CM | POA: Diagnosis not present

## 2018-06-04 MED ORDER — DENOSUMAB 60 MG/ML ~~LOC~~ SOSY
PREFILLED_SYRINGE | SUBCUTANEOUS | Status: AC
  Filled 2018-06-04: qty 1

## 2018-06-04 MED ORDER — DENOSUMAB 60 MG/ML ~~LOC~~ SOSY
60.0000 mg | PREFILLED_SYRINGE | Freq: Once | SUBCUTANEOUS | Status: AC
  Administered 2018-06-04: 60 mg via SUBCUTANEOUS

## 2018-06-04 NOTE — Discharge Instructions (Signed)
Denosumab injection °What is this medicine? °DENOSUMAB (den oh sue mab) slows bone breakdown. Prolia is used to treat osteoporosis in women after menopause and in men. Xgeva is used to treat a high calcium level due to cancer and to prevent bone fractures and other bone problems caused by multiple myeloma or cancer bone metastases. Xgeva is also used to treat giant cell tumor of the bone. °This medicine may be used for other purposes; ask your health care provider or pharmacist if you have questions. °COMMON BRAND NAME(S): Prolia, XGEVA °What should I tell my health care provider before I take this medicine? °They need to know if you have any of these conditions: °-dental disease °-having surgery or tooth extraction °-infection °-kidney disease °-low levels of calcium or Vitamin D in the blood °-malnutrition °-on hemodialysis °-skin conditions or sensitivity °-thyroid or parathyroid disease °-an unusual reaction to denosumab, other medicines, foods, dyes, or preservatives °-pregnant or trying to get pregnant °-breast-feeding °How should I use this medicine? °This medicine is for injection under the skin. It is given by a health care professional in a hospital or clinic setting. °If you are getting Prolia, a special MedGuide will be given to you by the pharmacist with each prescription and refill. Be sure to read this information carefully each time. °For Prolia, talk to your pediatrician regarding the use of this medicine in children. Special care may be needed. For Xgeva, talk to your pediatrician regarding the use of this medicine in children. While this drug may be prescribed for children as young as 13 years for selected conditions, precautions do apply. °Overdosage: If you think you have taken too much of this medicine contact a poison control center or emergency room at once. °NOTE: This medicine is only for you. Do not share this medicine with others. °What if I miss a dose? °It is important not to miss your  dose. Call your doctor or health care professional if you are unable to keep an appointment. °What may interact with this medicine? °Do not take this medicine with any of the following medications: °-other medicines containing denosumab °This medicine may also interact with the following medications: °-medicines that lower your chance of fighting infection °-steroid medicines like prednisone or cortisone °This list may not describe all possible interactions. Give your health care provider a list of all the medicines, herbs, non-prescription drugs, or dietary supplements you use. Also tell them if you smoke, drink alcohol, or use illegal drugs. Some items may interact with your medicine. °What should I watch for while using this medicine? °Visit your doctor or health care professional for regular checks on your progress. Your doctor or health care professional may order blood tests and other tests to see how you are doing. °Call your doctor or health care professional for advice if you get a fever, chills or sore throat, or other symptoms of a cold or flu. Do not treat yourself. This drug may decrease your body's ability to fight infection. Try to avoid being around people who are sick. °You should make sure you get enough calcium and vitamin D while you are taking this medicine, unless your doctor tells you not to. Discuss the foods you eat and the vitamins you take with your health care professional. °See your dentist regularly. Brush and floss your teeth as directed. Before you have any dental work done, tell your dentist you are receiving this medicine. °Do not become pregnant while taking this medicine or for 5 months after stopping   it. Talk with your doctor or health care professional about your birth control options while taking this medicine. Women should inform their doctor if they wish to become pregnant or think they might be pregnant. There is a potential for serious side effects to an unborn child. Talk  to your health care professional or pharmacist for more information. What side effects may I notice from receiving this medicine? Side effects that you should report to your doctor or health care professional as soon as possible: -allergic reactions like skin rash, itching or hives, swelling of the face, lips, or tongue -bone pain -breathing problems -dizziness -jaw pain, especially after dental work -redness, blistering, peeling of the skin -signs and symptoms of infection like fever or chills; cough; sore throat; pain or trouble passing urine -signs of low calcium like fast heartbeat, muscle cramps or muscle pain; pain, tingling, numbness in the hands or feet; seizures -unusual bleeding or bruising -unusually weak or tired Side effects that usually do not require medical attention (report to your doctor or health care professional if they continue or are bothersome): -constipation -diarrhea -headache -joint pain -loss of appetite -muscle pain -runny nose -tiredness -upset stomach This list may not describe all possible side effects. Call your doctor for medical advice about side effects. You may report side effects to FDA at 1-800-FDA-1088. Where should I keep my medicine? This medicine is only given in a clinic, doctor's office, or other health care setting and will not be stored at home. NOTE: This sheet is a summary. It may not cover all possible information. If you have questions about this medicine, talk to your doctor, pharmacist, or health care provider.  2018 Elsevier/Gold Standard (2016-08-01 19:17:21)

## 2018-06-07 DIAGNOSIS — R7301 Impaired fasting glucose: Secondary | ICD-10-CM | POA: Diagnosis not present

## 2018-06-07 DIAGNOSIS — M19049 Primary osteoarthritis, unspecified hand: Secondary | ICD-10-CM | POA: Diagnosis not present

## 2018-06-07 DIAGNOSIS — Z1389 Encounter for screening for other disorder: Secondary | ICD-10-CM | POA: Diagnosis not present

## 2018-06-07 DIAGNOSIS — Z6827 Body mass index (BMI) 27.0-27.9, adult: Secondary | ICD-10-CM | POA: Diagnosis not present

## 2018-06-07 DIAGNOSIS — E559 Vitamin D deficiency, unspecified: Secondary | ICD-10-CM | POA: Diagnosis not present

## 2018-06-07 DIAGNOSIS — M810 Age-related osteoporosis without current pathological fracture: Secondary | ICD-10-CM | POA: Diagnosis not present

## 2018-06-07 DIAGNOSIS — Z23 Encounter for immunization: Secondary | ICD-10-CM | POA: Diagnosis not present

## 2018-06-07 DIAGNOSIS — R3911 Hesitancy of micturition: Secondary | ICD-10-CM | POA: Diagnosis not present

## 2018-06-07 DIAGNOSIS — I10 Essential (primary) hypertension: Secondary | ICD-10-CM | POA: Diagnosis not present

## 2018-06-07 DIAGNOSIS — N3281 Overactive bladder: Secondary | ICD-10-CM | POA: Diagnosis not present

## 2018-06-07 DIAGNOSIS — Z1212 Encounter for screening for malignant neoplasm of rectum: Secondary | ICD-10-CM | POA: Diagnosis not present

## 2018-06-07 DIAGNOSIS — Z0000 Encounter for general adult medical examination without abnormal findings: Secondary | ICD-10-CM | POA: Diagnosis not present

## 2018-06-07 DIAGNOSIS — H532 Diplopia: Secondary | ICD-10-CM | POA: Diagnosis not present

## 2018-06-07 DIAGNOSIS — N183 Chronic kidney disease, stage 3 (moderate): Secondary | ICD-10-CM | POA: Diagnosis not present

## 2018-07-02 DIAGNOSIS — H04123 Dry eye syndrome of bilateral lacrimal glands: Secondary | ICD-10-CM | POA: Diagnosis not present

## 2018-09-24 DIAGNOSIS — R3912 Poor urinary stream: Secondary | ICD-10-CM | POA: Diagnosis not present

## 2018-09-24 DIAGNOSIS — N3281 Overactive bladder: Secondary | ICD-10-CM | POA: Diagnosis not present

## 2018-09-24 DIAGNOSIS — N401 Enlarged prostate with lower urinary tract symptoms: Secondary | ICD-10-CM | POA: Diagnosis not present

## 2019-01-01 ENCOUNTER — Ambulatory Visit (HOSPITAL_COMMUNITY)
Admission: RE | Admit: 2019-01-01 | Discharge: 2019-01-01 | Disposition: A | Discharge status: Home / Self Care | Payer: Medicare Other | Source: Ambulatory Visit | Attending: Internal Medicine | Admitting: Internal Medicine

## 2019-01-01 ENCOUNTER — Other Ambulatory Visit: Payer: Self-pay

## 2019-01-01 DIAGNOSIS — M810 Age-related osteoporosis without current pathological fracture: Secondary | ICD-10-CM | POA: Insufficient documentation

## 2019-01-01 MED ORDER — DENOSUMAB 60 MG/ML ~~LOC~~ SOSY
PREFILLED_SYRINGE | SUBCUTANEOUS | Status: AC
  Administered 2019-01-01: 60 mg
  Filled 2019-01-01: qty 1

## 2019-06-27 DIAGNOSIS — M810 Age-related osteoporosis without current pathological fracture: Secondary | ICD-10-CM | POA: Diagnosis not present

## 2019-06-27 DIAGNOSIS — R7301 Impaired fasting glucose: Secondary | ICD-10-CM | POA: Diagnosis not present

## 2019-06-27 DIAGNOSIS — E7849 Other hyperlipidemia: Secondary | ICD-10-CM | POA: Diagnosis not present

## 2019-08-11 ENCOUNTER — Other Ambulatory Visit (HOSPITAL_COMMUNITY): Payer: Self-pay | Admitting: *Deleted

## 2019-08-12 ENCOUNTER — Encounter (HOSPITAL_COMMUNITY)
Admission: RE | Admit: 2019-08-12 | Discharge: 2019-08-12 | Disposition: A | Discharge status: Home / Self Care | Payer: Medicare Other | Source: Ambulatory Visit | Attending: Internal Medicine | Admitting: Internal Medicine

## 2019-08-12 ENCOUNTER — Other Ambulatory Visit: Payer: Self-pay

## 2019-08-12 DIAGNOSIS — M810 Age-related osteoporosis without current pathological fracture: Secondary | ICD-10-CM | POA: Insufficient documentation

## 2019-08-12 MED ORDER — DENOSUMAB 60 MG/ML ~~LOC~~ SOSY
PREFILLED_SYRINGE | SUBCUTANEOUS | Status: AC
  Administered 2019-08-12: 60 mg via SUBCUTANEOUS
  Filled 2019-08-12: qty 1

## 2019-08-12 MED ORDER — DENOSUMAB 60 MG/ML ~~LOC~~ SOSY: 60.0000 mg | PREFILLED_SYRINGE | Freq: Once | SUBCUTANEOUS | Status: AC

## 2019-10-01 DIAGNOSIS — N3281 Overactive bladder: Secondary | ICD-10-CM | POA: Diagnosis not present

## 2019-10-01 DIAGNOSIS — Z125 Encounter for screening for malignant neoplasm of prostate: Secondary | ICD-10-CM | POA: Diagnosis not present

## 2019-10-22 DIAGNOSIS — Z23 Encounter for immunization: Secondary | ICD-10-CM | POA: Diagnosis not present

## 2019-10-27 DIAGNOSIS — Z1331 Encounter for screening for depression: Secondary | ICD-10-CM | POA: Diagnosis not present

## 2019-10-27 DIAGNOSIS — R2681 Unsteadiness on feet: Secondary | ICD-10-CM | POA: Diagnosis not present

## 2019-10-27 DIAGNOSIS — M810 Age-related osteoporosis without current pathological fracture: Secondary | ICD-10-CM | POA: Diagnosis not present

## 2019-10-27 DIAGNOSIS — I10 Essential (primary) hypertension: Secondary | ICD-10-CM | POA: Diagnosis not present

## 2019-10-27 DIAGNOSIS — R7301 Impaired fasting glucose: Secondary | ICD-10-CM | POA: Diagnosis not present

## 2019-10-27 DIAGNOSIS — I872 Venous insufficiency (chronic) (peripheral): Secondary | ICD-10-CM | POA: Diagnosis not present

## 2019-11-14 ENCOUNTER — Other Ambulatory Visit (HOSPITAL_BASED_OUTPATIENT_CLINIC_OR_DEPARTMENT_OTHER): Payer: Self-pay | Admitting: Internal Medicine

## 2019-11-14 DIAGNOSIS — E119 Type 2 diabetes mellitus without complications: Secondary | ICD-10-CM | POA: Diagnosis not present

## 2019-11-14 DIAGNOSIS — I872 Venous insufficiency (chronic) (peripheral): Secondary | ICD-10-CM | POA: Diagnosis not present

## 2019-11-14 DIAGNOSIS — M79672 Pain in left foot: Secondary | ICD-10-CM | POA: Diagnosis not present

## 2019-11-14 DIAGNOSIS — R600 Localized edema: Secondary | ICD-10-CM | POA: Diagnosis not present

## 2019-11-15 ENCOUNTER — Ambulatory Visit (HOSPITAL_BASED_OUTPATIENT_CLINIC_OR_DEPARTMENT_OTHER)
Admission: RE | Admit: 2019-11-15 | Discharge: 2019-11-15 | Disposition: A | Discharge status: Home / Self Care | Payer: Medicare Other | Source: Ambulatory Visit | Attending: Internal Medicine | Admitting: Internal Medicine

## 2019-11-15 ENCOUNTER — Other Ambulatory Visit: Payer: Self-pay

## 2019-11-15 DIAGNOSIS — R600 Localized edema: Secondary | ICD-10-CM | POA: Insufficient documentation

## 2019-11-18 ENCOUNTER — Ambulatory Visit: Payer: Medicare Other | Attending: Internal Medicine | Admitting: Physical Therapy

## 2019-11-18 ENCOUNTER — Other Ambulatory Visit: Payer: Self-pay

## 2019-11-18 ENCOUNTER — Encounter: Payer: Self-pay | Admitting: Physical Therapy

## 2019-11-18 DIAGNOSIS — R2689 Other abnormalities of gait and mobility: Secondary | ICD-10-CM | POA: Insufficient documentation

## 2019-11-18 DIAGNOSIS — M6281 Muscle weakness (generalized): Secondary | ICD-10-CM | POA: Diagnosis not present

## 2019-11-18 DIAGNOSIS — M545 Low back pain: Secondary | ICD-10-CM | POA: Insufficient documentation

## 2019-11-18 DIAGNOSIS — G8929 Other chronic pain: Secondary | ICD-10-CM | POA: Diagnosis not present

## 2019-11-18 NOTE — Therapy (Signed)
[facility] [address], [place], [place], [address] Phone: [phone]   Fax:  [phone]  Physical Therapy Treatment  Patient Details  Name: ADONIAS ABUD MRN: [id number] Date of Birth: 1932/08/22 Referring Provider (PT): Dr. Leanna Battles   Encounter Date: 11/18/2019  PT End of Session - 11/18/19 1147    Visit Number  1    Date for PT Re-Evaluation  01/13/20    Authorization Type  Medicare, AARP    PT Start Time  1100    PT Stop Time  1142    PT Time Calculation (min)  42 min    Activity Tolerance  Patient tolerated treatment well    Behavior During Therapy  [facility] for tasks assessed/performed       Past Medical History:  Diagnosis Date  . Arthritis   . Barrett's esophagus   . Compression fracture of spine ([place])    T 10  . Diabetes ([place])   . Elevated cholesterol   . Gastric ulcer 1990  . Hemorrhoids   . History of colon polyps   . Hypertension   . Tubular adenoma of colon     Past Surgical History:  Procedure Laterality Date  . cataract surgery bilateral    . NO PAST SURGERIES    . UPPER GASTROINTESTINAL ENDOSCOPY      There were no vitals filed for this visit.  Subjective Assessment - 11/18/19 1107    Subjective  Patient reports his left foot was swollen and hurt him. Tried compression socks on the left foot. I feel wobbly with my walking. I still have back pain since I fell.    Patient Stated Goals  feel steady with walking, work with back pain    Currently in Pain?  Yes    Pain Score  5     Pain Location  Back    Pain Orientation  Mid;Lower    Pain Descriptors / Indicators  Discomfort;Constant    Pain Type  Chronic pain    Pain Onset  More than a month ago    Pain Frequency  Constant    Aggravating Factors   walking, standing up    Pain Relieving Factors  pain medication    Multiple Pain Sites  Yes    Pain Score  6    Pain Location  Ankle    Pain Orientation  Left    Pain  Descriptors / Indicators  Aching    Pain Type  Acute pain    Pain Onset  More than a month ago    Pain Frequency  Intermittent    Aggravating Factors   touch the inner left ankle, movement    Pain Relieving Factors  not sure         [facility] PT Assessment - 11/18/19 0001      Assessment   Medical Diagnosis  R26.81 Unsteady Gait; M54.9 BAck pain    Referring Provider (PT)  Dr. Leanna Battles    Onset Date/Surgical Date  --   chronic   Prior Therapy  none      Precautions   Precautions  None      Restrictions   Weight Bearing Restrictions  No      Balance Screen   Has the patient fallen in the past 6 months  No    Has the patient had a decrease in activity level because of a fear of falling?   No    Is the patient reluctant  to leave their home because of a fear of falling?   No      Home Environment   Living Environment  Private residence    Type of [place] Access  Stairs to enter    Entrance Stairs-Number of Steps  3    Home Layout  One level      Prior Function   Level of [place]  Retired      Associate Professor   Overall Cognitive Status  Within Functional Limits for tasks assessed      Observation/Other Assessments   Focus on Therapeutic Outcomes (FOTO)   44% limitation; goal is 38% limitation      Posture/Postural Control   Posture/Postural Control  Postural limitations    Postural Limitations  Forward head;Rounded Shoulders   sway back     ROM / Strength   AROM / PROM / Strength  AROM;PROM;Strength      AROM   Overall AROM Comments  lumbar movement decreased by 50%      PROM   Right Hip External Rotation   20    Left Hip External Rotation   40      Strength   Left Ankle Dorsiflexion  4+/5    Left Ankle Plantar Flexion  3+/5    Left Ankle Inversion  4/5    Left Ankle Eversion  4/5      Palpation   Palpation comment  decreased rib cage expansion      Standardized Balance Assessment   Standardized Balance  Assessment  Timed Up and Go Test;Dynamic Gait Index      Dynamic Gait Index   Level Surface  Moderate Impairment    Change in Gait Speed  Mild Impairment    Gait with Horizontal Head Turns  Moderate Impairment    Gait with Vertical Head Turns  Moderate Impairment    Gait and Pivot Turn  Moderate Impairment    Step Over Obstacle  Mild Impairment    Step Around Obstacles  Mild Impairment    Steps  Mild Impairment    Total Score  12    DGI comment:  risk of falls      Timed Up and Go Test   TUG  Normal TUG    Normal TUG (seconds)  21    TUG Comments  risk of falls                             PT Short Term Goals - 11/18/19 1709      PT SHORT TERM GOAL #1   Title  independent with initial HEP    Time  4    Period  Weeks    Status  New    Target Date  12/16/19      PT SHORT TERM GOAL #2   Title  TUG score </= 18 sec    Time  4    Period  Weeks    Status  New    Target Date  01/13/20      PT SHORT TERM GOAL #3   Title  understand correct posture to reduce strain on back and decrease pain    Time  4    Period  Weeks    Status  New    Target Date  12/16/19      PT SHORT TERM GOAL #4   Title  ---  PT Long Term Goals - 11/18/19 1710      PT LONG TERM GOAL #1   Title  independent with HEP for postural strength and balance    Time  8    Period  Weeks    Status  New    Target Date  01/13/20      PT LONG TERM GOAL #2   Title  TUG score </= 13 seconds due to improved balance    Time  8    Period  Weeks    Status  New    Target Date  01/13/20      PT LONG TERM GOAL #3   Title  perform daily tasks  with  >/= 50%  ease due to improved mobility and decreased lumbar pain    Time  8    Period  Weeks    Status  New    Target Date  01/13/20      PT LONG TERM GOAL #4   Title  patient reports he walks with increased steadiness >/= 50% due to improve strength and posture    Baseline  --    Time  8    Period  Weeks    Status  New     Target Date  01/13/20      PT LONG TERM GOAL #5   Title  Dynamic gait test score >/= 19 due to increased balance    Baseline  --    Time  8    Period  Weeks    Status  New    Target Date  01/13/20            Plan - 11/18/19 1700    Clinical Impression Statement  Patient is a 86 year old male with low back pain, gait deficits, and pain in left ankle. Patient has had difficulty since he fell in 2006. Patient walks with a sway back, bulging abdomen, increased thoracic kyphosis, small steps and decreased heel strike. Patient reports his back pain is constant at level 5/10 and left ankle pai nis 6/10. Patient has weakness in left ankle. Patient as decreased bilateral hip external rotation. TUG is 21 seconds indicating risk of falls. Dynamic gait test is 12 points and is under 19 points indicating risk of falls. Patient back will fatique after several minutes when he is not sitting in a chair without back support. Patient will benefit from skilled therapy to improve his gait and balance while reducing back and left ankle pain.    Personal Factors and Comorbidities  Age;Fitness;Comorbidity 2;Time since onset of injury/illness/exacerbation    Comorbidities  Diabetes; fractured lumbar spine 2006    Examination-Activity Limitations  Locomotion Level;Stand;Sit;Reach Overhead    Examination-Participation Restrictions  Community Activity    Stability/Clinical Decision Making  Evolving/Moderate complexity    Clinical Decision Making  Low    Rehab Potential  Good    PT Frequency  2x / week    PT Duration  8 weeks    PT Treatment/Interventions  Cryotherapy;Dentist;Therapeutic activities;Therapeutic exercise;Balance training;Neuromuscular re-education;Manual techniques;Patient/family education;Joint Manipulations    PT Next Visit Plan  nustep; pressing back into the mat for decompression, modalities as needed, engaging lower abdomin for core  strength, hip stretches and ankle    Consulted and Agree with Plan of Care  Patient       Patient will benefit from skilled therapeutic intervention in order to improve the following deficits and impairments:  Abnormal gait, Decreased range of  motion, Increased fascial restricitons, Pain, Decreased strength, Difficulty walking  Visit Diagnosis: Muscle weakness (generalized)  Chronic bilateral low back pain without sciatica  Other abnormalities of gait and mobility     Problem List Patient Active Problem List   Diagnosis Date Noted  . Barrett's esophagus 05/20/2013  . HTN (hypertension) 05/20/2013  . GENERALIZED OSTEOARTHROSIS UNSPECIFIED SITE 06/10/2008  . EPIGASTRIC PAIN 06/10/2008  . GASTRIC ULCER, HX OF 06/10/2008  . INTERNAL HEMORRHOIDS 06/08/2008    Earlie Counts, PT 11/18/19 5:13 PM   [facility] [address], [place], [place], [address] Phone: [phone]   Fax:  [phone]  Name: JAHMARION BARTUSEK MRN: [id number] Date of Birth: 1933-04-25

## 2019-11-19 NOTE — Addendum Note (Signed)
Addended by: Earlie Counts F on: 11/19/2019 11:41 AM   Modules accepted: Orders

## 2019-11-20 DIAGNOSIS — Z23 Encounter for immunization: Secondary | ICD-10-CM | POA: Diagnosis not present

## 2019-11-26 ENCOUNTER — Ambulatory Visit: Payer: Medicare Other | Attending: Internal Medicine | Admitting: Physical Therapy

## 2019-11-26 ENCOUNTER — Telehealth: Payer: Self-pay | Admitting: Physical Therapy

## 2019-11-26 DIAGNOSIS — R2689 Other abnormalities of gait and mobility: Secondary | ICD-10-CM | POA: Insufficient documentation

## 2019-11-26 DIAGNOSIS — M6281 Muscle weakness (generalized): Secondary | ICD-10-CM | POA: Insufficient documentation

## 2019-11-26 DIAGNOSIS — G8929 Other chronic pain: Secondary | ICD-10-CM | POA: Insufficient documentation

## 2019-11-26 DIAGNOSIS — M545 Low back pain: Secondary | ICD-10-CM | POA: Insufficient documentation

## 2019-11-26 NOTE — Telephone Encounter (Signed)
Patient did not realize he had an appointment today.  Earlie Counts, PT @5 /11/2019@ 12:19 PM

## 2019-12-08 ENCOUNTER — Other Ambulatory Visit: Payer: Self-pay

## 2019-12-08 ENCOUNTER — Encounter: Payer: Self-pay | Admitting: Physical Therapy

## 2019-12-08 ENCOUNTER — Ambulatory Visit: Payer: Medicare Other | Admitting: Physical Therapy

## 2019-12-08 DIAGNOSIS — M545 Low back pain, unspecified: Secondary | ICD-10-CM

## 2019-12-08 DIAGNOSIS — R2689 Other abnormalities of gait and mobility: Secondary | ICD-10-CM

## 2019-12-08 DIAGNOSIS — M6281 Muscle weakness (generalized): Secondary | ICD-10-CM | POA: Diagnosis not present

## 2019-12-08 DIAGNOSIS — G8929 Other chronic pain: Secondary | ICD-10-CM | POA: Diagnosis not present

## 2019-12-08 NOTE — Patient Instructions (Signed)
Access Code: [email or web address] URL: [email or web address] Date: 12/08/2019 Prepared by: Earlie Counts  Exercises Single Leg Heel Raise with Counter Support - 1 x daily - 7 x weekly - 1 sets - 10 reps Lateral Step Up with Unilateral Counter Support - 1 x daily - 7 x weekly - 1 sets - 10 reps Forward Step Up - 1 x daily - 7 x weekly - 1 sets - 10 reps Standing Gastroc Stretch - 1 x daily - 7 x weekly - 1 sets - 2 reps - 30 sec hold [facility] [address] [place], [place] [address] Phone # [phone] Fax [phone]

## 2019-12-08 NOTE — Therapy (Signed)
[facility] [address], [place], [place], [address] Phone: [phone]   Fax:  [phone]  Physical Therapy Treatment  Patient Details  Name: Cameron Todd MRN: [id number] Date of Birth: [birth year] Referring Provider (PT): Dr. Leanna Battles   Encounter Date: 12/08/2019  PT End of Session - 12/08/19 1551    Visit Number  2    Date for PT Re-Evaluation  01/13/20    Authorization Type  Medicare, AARP    PT Start Time  [id number]    PT Stop Time  1610    PT Time Calculation (min)  40 min    Activity Tolerance  Patient tolerated treatment well    Behavior During Therapy  [facility] for tasks assessed/performed       Past Medical History:  Diagnosis Date  . Arthritis   . Barrett's esophagus   . Compression fracture of spine ([place])    T 10  . Diabetes ([place])   . Elevated cholesterol   . Gastric ulcer 1990  . Hemorrhoids   . History of colon polyps   . Hypertension   . Tubular adenoma of colon     Past Surgical History:  Procedure Laterality Date  . cataract surgery bilateral    . NO PAST SURGERIES    . UPPER GASTROINTESTINAL ENDOSCOPY      There were no vitals filed for this visit.  Subjective Assessment - 12/08/19 1539    Subjective  I still have the swelling in the left.    Patient Stated Goals  feel steady with walking, work with back pain    Currently in Pain?  Yes    Pain Score  4     Pain Location  Back    Pain Orientation  Mid;Lower    Pain Descriptors / Indicators  Discomfort;Constant    Pain Type  Chronic pain    Pain Onset  More than a month ago    Pain Frequency  Constant    Aggravating Factors   walking, standing up    Pain Relieving Factors  pain medication    Multiple Pain Sites  Yes    Pain Score  6    Pain Location  Ankle    Pain Orientation  Left    Pain Descriptors / Indicators  Aching    Pain Type  Acute pain    Pain Onset  More than a month ago    Pain Frequency  Intermittent    Aggravating Factors   touch the inner left ankle, movement    Pain Relieving Factors  not sure                        [facility] Adult PT Treatment/Exercise - 12/08/19 0001      Neuro Re-ed    Neuro Re-ed Details   walking with head motions and CG       Lumbar Exercises: Stretches   Active Hamstring Stretch  Right;Left;1 rep;30 seconds    Active Hamstring Stretch Limitations  sitting      Lumbar Exercises: Aerobic   Nustep  level 4; 5 min; seat #8      Lumbar Exercises: Seated   Sit to Stand  10 reps    Sit to Stand Limitations  VC to keep chest up    Other Seated Lumbar Exercises  hip flexion with 2# on each ankel 30x      Knee/Hip Exercises: Standing   Hip Extension  Stengthening;Right;Left;1 set;15 reps    Extension Limitations  2# on each ankle    Other Standing Knee Exercises  step up and over 6 inch step and turn to go back 10x with one time having to hold bar due to loss of balance; then do the same with side step and lose balance one time             PT Education - 12/08/19 1610    Education Details  Access Code: [email or web address]    Person(s) Educated  Patient    Methods  Explanation;Demonstration;Verbal cues;Handout    Comprehension  Returned demonstration;Verbalized understanding       PT Short Term Goals - 12/08/19 1614      PT SHORT TERM GOAL #1   Title  independent with initial HEP    Time  4    Period  Weeks    Status  On-going      PT SHORT TERM GOAL #2   Title  TUG score </= 18 sec    Time  4    Period  Weeks    Status  On-going      PT SHORT TERM GOAL #3   Title  understand correct posture to reduce strain on back and decrease pain    Time  4    Period  Weeks    Status  On-going        PT Long Term Goals - 11/18/19 1710      PT LONG TERM GOAL #1   Title  independent with HEP for postural strength and balance    Time  8    Period  Weeks    Status  New    Target Date  01/13/20      PT LONG TERM GOAL #2   Title  TUG score  </= 13 seconds due to improved balance    Time  8    Period  Weeks    Status  New    Target Date  01/13/20      PT LONG TERM GOAL #3   Title  perform daily tasks  with  >/= 50%  ease due to improved mobility and decreased lumbar pain    Time  8    Period  Weeks    Status  New    Target Date  01/13/20      PT LONG TERM GOAL #4   Title  patient reports he walks with increased steadiness >/= 50% due to improve strength and posture    Baseline  --    Time  8    Period  Weeks    Status  New    Target Date  01/13/20      PT LONG TERM GOAL #5   Title  Dynamic gait test score >/= 19 due to increased balance    Baseline  --    Time  8    Period  Weeks    Status  New    Target Date  01/13/20            Plan - 12/08/19 1552    Clinical Impression Statement  Patient lost his balance and regained it with step up and over and lateral step up. Patient needed CG with walking and head movements. Patient had trouble to walk and move his head and needed to go very slow and change his speed often. Patient is not able to do the piriformis stretch in sitting due to hip tightness. He needed  verbal cues to keep his chest up with hamstring stretch in sitting. When patient goes from sit to stand will raise his toes up. Patient will benefit from skilled therapy to improve his gait and balance while reducing back and left ankle pain.    Personal Factors and Comorbidities  Age;Fitness;Comorbidity 2;Time since onset of injury/illness/exacerbation    Examination-Activity Limitations  Locomotion Level;Stand;Sit;Reach Overhead    Examination-Participation Restrictions  Community Activity    Stability/Clinical Decision Making  Evolving/Moderate complexity    Rehab Potential  Good    PT Frequency  2x / week    PT Duration  8 weeks    PT Treatment/Interventions  Cryotherapy;Dentist;Therapeutic activities;Therapeutic exercise;Balance  training;Neuromuscular re-education;Manual techniques;Patient/family education;Joint Manipulations    PT Next Visit Plan  nustep; pressing back into the mat for decompression, modalities as needed, engaging lower abdomin for core strength, walking with head movements; discuss posture    PT Home Exercise Plan  Access Code: [email or web address]    Recommended Other Services  MD signed intial eval    Consulted and Agree with Plan of Care  Patient       Patient will benefit from skilled therapeutic intervention in order to improve the following deficits and impairments:  Abnormal gait, Decreased range of motion, Increased fascial restricitons, Pain, Decreased strength, Difficulty walking  Visit Diagnosis: Muscle weakness (generalized)  Chronic bilateral low back pain without sciatica  Other abnormalities of gait and mobility     Problem List Patient Active Problem List   Diagnosis Date Noted  . Barrett's esophagus 05/20/2013  . HTN (hypertension) 05/20/2013  . GENERALIZED OSTEOARTHROSIS UNSPECIFIED SITE 06/10/2008  . EPIGASTRIC PAIN 06/10/2008  . GASTRIC ULCER, HX OF 06/10/2008  . INTERNAL HEMORRHOIDS 06/08/2008    Earlie Counts, PT 12/08/19 4:15 PM    [facility] [address], [place], [place], [address] Phone: [phone]   Fax:  [phone]  Name: PAVLE HARIG MRN: [id number] Date of Birth: [birth year]

## 2019-12-15 ENCOUNTER — Encounter: Payer: Self-pay | Admitting: Physical Therapy

## 2019-12-15 ENCOUNTER — Ambulatory Visit: Payer: Medicare Other | Admitting: Physical Therapy

## 2019-12-15 ENCOUNTER — Other Ambulatory Visit: Payer: Self-pay

## 2019-12-15 DIAGNOSIS — M6281 Muscle weakness (generalized): Secondary | ICD-10-CM | POA: Diagnosis not present

## 2019-12-15 DIAGNOSIS — G8929 Other chronic pain: Secondary | ICD-10-CM | POA: Diagnosis not present

## 2019-12-15 DIAGNOSIS — R2689 Other abnormalities of gait and mobility: Secondary | ICD-10-CM | POA: Diagnosis not present

## 2019-12-15 DIAGNOSIS — M545 Low back pain: Secondary | ICD-10-CM | POA: Diagnosis not present

## 2019-12-15 NOTE — Therapy (Signed)
[facility] [address], [place], [place], [address] Phone: [phone]   Fax:  [phone]  Physical Therapy Treatment  Patient Details  Name: Cameron Todd MRN: [id number] Date of Birth: [birth year] Referring Provider (PT): Dr. Leanna Battles   Encounter Date: 12/15/2019  PT End of Session - 12/15/19 1526    Visit Number  3    Date for PT Re-Evaluation  01/13/20    Authorization Type  Medicare, AARP    PT Start Time  1525    PT Stop Time  1605    PT Time Calculation (min)  40 min    Activity Tolerance  Patient tolerated treatment well    Behavior During Therapy  [facility] for tasks assessed/performed       Past Medical History:  Diagnosis Date  . Arthritis   . Barrett's esophagus   . Compression fracture of spine ([place])    T 10  . Diabetes ([place])   . Elevated cholesterol   . Gastric ulcer 1990  . Hemorrhoids   . History of colon polyps   . Hypertension   . Tubular adenoma of colon     Past Surgical History:  Procedure Laterality Date  . cataract surgery bilateral    . NO PAST SURGERIES    . UPPER GASTROINTESTINAL ENDOSCOPY      There were no vitals filed for this visit.  Subjective Assessment - 12/15/19 1527    Subjective  No new complaints today.                        [facility] Adult PT Treatment/Exercise - 12/15/19 0001      Neuro Re-ed    Neuro Re-ed Details   walking with head motions and CG    Scissoring     Lumbar Exercises: Stretches   Active Hamstring Stretch  Right;Left;2 reps;30 seconds    Active Hamstring Stretch Limitations  sitting      Lumbar Exercises: Aerobic   Nustep  L4 x 8 min at end of session      Knee/Hip Exercises: Standing   Heel Raises  Both;1 set;15 reps    Hip Extension  --   yellow band 10x alt, UE support     Knee/Hip Exercises: Supine   Bridges  --   ball squeeze and more glute squeeze 10x   Other Supine Knee/Hip Exercises  Leg press Bil 10x  3 sec each      Shoulder Exercises: Supine   Horizontal ABduction  --   red band with chin tuck 10x   Other Supine Exercises  Collarbone lengthener 10x 3 sec hold    Other Supine Exercises  head press 3 sec hold 10x,                PT Short Term Goals - 12/08/19 1614      PT SHORT TERM GOAL #1   Title  independent with initial HEP    Time  4    Period  Weeks    Status  On-going      PT SHORT TERM GOAL #2   Title  TUG score </= 18 sec    Time  4    Period  Weeks    Status  On-going      PT SHORT TERM GOAL #3   Title  understand correct posture to reduce strain on back and decrease pain    Time  4  Period  Weeks    Status  On-going        PT Long Term Goals - 11/18/19 1710      PT LONG TERM GOAL #1   Title  independent with HEP for postural strength and balance    Time  8    Period  Weeks    Status  New    Target Date  01/13/20      PT LONG TERM GOAL #2   Title  TUG score </= 13 seconds due to improved balance    Time  8    Period  Weeks    Status  New    Target Date  01/13/20      PT LONG TERM GOAL #3   Title  perform daily tasks  with  >/= 50%  ease due to improved mobility and decreased lumbar pain    Time  8    Period  Weeks    Status  New    Target Date  01/13/20      PT LONG TERM GOAL #4   Title  patient reports he walks with increased steadiness >/= 50% due to improve strength and posture    Baseline  --    Time  8    Period  Weeks    Status  New    Target Date  01/13/20      PT LONG TERM GOAL #5   Title  Dynamic gait test score >/= 19 due to increased balance    Baseline  --    Time  8    Period  Weeks    Status  New    Target Date  01/13/20            Plan - 12/15/19 1526    Clinical Impression Statement  Pt arrives with no complaints of back pain. Pt completes all supine decompression exercises well. Pt continues to scissor as he walks while performing head turns. Pt reports he is going to do some Tai Chi at home.     Personal Factors and Comorbidities  Age;Fitness;Comorbidity 2;Time since onset of injury/illness/exacerbation    Comorbidities  Diabetes; fractured lumbar spine 2006    Examination-Activity Limitations  Locomotion Level;Stand;Sit;Reach Overhead    Examination-Participation Restrictions  Community Activity    Stability/Clinical Decision Making  Evolving/Moderate complexity    Rehab Potential  Good    PT Frequency  2x / week    PT Duration  8 weeks    PT Treatment/Interventions  Cryotherapy;Dentist;Therapeutic activities;Therapeutic exercise;Balance training;Neuromuscular re-education;Manual techniques;Patient/family education;Joint Manipulations    PT Home Exercise Plan  Access Code: [email or web address]    Consulted and Agree with Plan of Care  Patient       Patient will benefit from skilled therapeutic intervention in order to improve the following deficits and impairments:  Abnormal gait, Decreased range of motion, Increased fascial restricitons, Pain, Decreased strength, Difficulty walking  Visit Diagnosis: Muscle weakness (generalized)  Chronic bilateral low back pain without sciatica  Other abnormalities of gait and mobility     Problem List Patient Active Problem List   Diagnosis Date Noted  . Barrett's esophagus 05/20/2013  . HTN (hypertension) 05/20/2013  . GENERALIZED OSTEOARTHROSIS UNSPECIFIED SITE 06/10/2008  . EPIGASTRIC PAIN 06/10/2008  . GASTRIC ULCER, HX OF 06/10/2008  . INTERNAL HEMORRHOIDS 06/08/2008    Montia Haslip, PTA 12/15/2019, 4:05 PM  [facility] [address] [place], [place], [address] Phone: [phone]  Fax:  [phone]  Name: Cameron Todd MRN: [id number] Date of Birth: 10-Nov-1932

## 2019-12-17 ENCOUNTER — Encounter: Payer: Self-pay | Admitting: Physical Therapy

## 2019-12-17 ENCOUNTER — Ambulatory Visit: Payer: Medicare Other | Admitting: Physical Therapy

## 2019-12-17 ENCOUNTER — Other Ambulatory Visit: Payer: Self-pay

## 2019-12-17 DIAGNOSIS — G8929 Other chronic pain: Secondary | ICD-10-CM | POA: Diagnosis not present

## 2019-12-17 DIAGNOSIS — R2689 Other abnormalities of gait and mobility: Secondary | ICD-10-CM | POA: Diagnosis not present

## 2019-12-17 DIAGNOSIS — M6281 Muscle weakness (generalized): Secondary | ICD-10-CM

## 2019-12-17 DIAGNOSIS — M545 Low back pain: Secondary | ICD-10-CM | POA: Diagnosis not present

## 2019-12-17 NOTE — Therapy (Signed)
[facility] [address], [place], [place], [address] Phone: [phone]   Fax:  [phone]  Physical Therapy Treatment  Patient Details  Name: Cameron Todd MRN: [id number] Date of Birth: 20-Jun-1933 Referring Provider (PT): Dr. Leanna Battles   Encounter Date: 12/17/2019  PT End of Session - 12/17/19 1457    Visit Number  4    Date for PT Re-Evaluation  01/13/20    Authorization Type  Medicare, AARP    PT Start Time  9244    PT Stop Time  1527    PT Time Calculation (min)  42 min    Activity Tolerance  Patient tolerated treatment well    Behavior During Therapy  [facility] for tasks assessed/performed       Past Medical History:  Diagnosis Date  . Arthritis   . Barrett's esophagus   . Compression fracture of spine ([place])    T 10  . Diabetes ([place])   . Elevated cholesterol   . Gastric ulcer 1990  . Hemorrhoids   . History of colon polyps   . Hypertension   . Tubular adenoma of colon     Past Surgical History:  Procedure Laterality Date  . cataract surgery bilateral    . NO PAST SURGERIES    . UPPER GASTROINTESTINAL ENDOSCOPY      There were no vitals filed for this visit.  Subjective Assessment - 12/17/19 1458    Subjective  Denies back pain today.    Currently in Pain?  No/denies         [facility] PT Assessment - 12/17/19 0001      Timed Up and Go Test   Normal TUG (seconds)  18                    [facility] Adult PT Treatment/Exercise - 12/17/19 0001      Neuro Re-ed    Neuro Re-ed Details   walking with nods, turnss, S curves, and stepping over objects   standing lifting 2# wts for shoulder strength and balance     Lumbar Exercises: Aerobic   UBE (Upper Arm Bike)  3 min reverse with pool noodle at thoracic 3 min L1    Nustep  L4 x 8 min review of status with PTA      Knee/Hip Exercises: Standing   Hip Abduction  Stengthening;Both;1 set;10 reps;Knee straight    Abduction  Limitations  2#    Hip Extension  Stengthening;Right;Left;1 set;15 reps    Extension Limitations  2# on each ankle      Knee/Hip Exercises: Seated   Abd/Adduction Weights  --   sit to stand with yellow band for horizontal abduction 10x   Sit to Sand  2 sets;10 reps;without UE support   Vc to push from balls of feet more     Shoulder Exercises: Standing   Extension  --   yellow band 10x each in narrow BOS and tandem stance              PT Short Term Goals - 12/17/19 1507      PT SHORT TERM GOAL #2   Title  TUG score </= 18 sec    Time  4    Period  Weeks    Status  --   18 sec   Target Date  01/13/20        PT Long Term Goals - 11/18/19 1710      PT LONG  TERM GOAL #1   Title  independent with HEP for postural strength and balance    Time  8    Period  Weeks    Status  New    Target Date  01/13/20      PT LONG TERM GOAL #2   Title  TUG score </= 13 seconds due to improved balance    Time  8    Period  Weeks    Status  New    Target Date  01/13/20      PT LONG TERM GOAL #3   Title  perform daily tasks  with  >/= 50%  ease due to improved mobility and decreased lumbar pain    Time  8    Period  Weeks    Status  New    Target Date  01/13/20      PT LONG TERM GOAL #4   Title  patient reports he walks with increased steadiness >/= 50% due to improve strength and posture    Baseline  --    Time  8    Period  Weeks    Status  New    Target Date  01/13/20      PT LONG TERM GOAL #5   Title  Dynamic gait test score >/= 19 due to increased balance    Baseline  --    Time  8    Period  Weeks    Status  New    Target Date  01/13/20            Plan - 12/17/19 1526    Clinical Impression Statement  pt arrives pain free. Pt began Lane at home yesterday and reports doing well with that. Pt achieved 18 sec TUG score today. Pt required verbal cuing to weighbear more on the balls of his feet when he stands vs putting all his weight into heels. Pt  wobbles with head turns & nods when walking.    Personal Factors and Comorbidities  Age;Fitness;Comorbidity 2;Time since onset of injury/illness/exacerbation    Comorbidities  Diabetes; fractured lumbar spine 2006    Examination-Activity Limitations  Locomotion Level;Stand;Sit;Reach Overhead    Examination-Participation Restrictions  Community Activity    Stability/Clinical Decision Making  Evolving/Moderate complexity    Rehab Potential  Good    PT Frequency  2x / week    PT Duration  8 weeks    PT Treatment/Interventions  Cryotherapy;Dentist;Therapeutic activities;Therapeutic exercise;Balance training;Neuromuscular re-education;Manual techniques;Patient/family education;Joint Manipulations    PT Home Exercise Plan  Access Code: [email or web address]    Consulted and Agree with Plan of Care  Patient       Patient will benefit from skilled therapeutic intervention in order to improve the following deficits and impairments:  Abnormal gait, Decreased range of motion, Increased fascial restricitons, Pain, Decreased strength, Difficulty walking  Visit Diagnosis: Muscle weakness (generalized)  Chronic bilateral low back pain without sciatica  Other abnormalities of gait and mobility     Problem List Patient Active Problem List   Diagnosis Date Noted  . Barrett's esophagus 05/20/2013  . HTN (hypertension) 05/20/2013  . GENERALIZED OSTEOARTHROSIS UNSPECIFIED SITE 06/10/2008  . EPIGASTRIC PAIN 06/10/2008  . GASTRIC ULCER, HX OF 06/10/2008  . INTERNAL HEMORRHOIDS 06/08/2008    Eyad Rochford, PTA 12/17/2019, 3:30 PM  [facility] [address] [place], [place], [address] Phone: [phone]   Fax:  [phone]  Name: Cameron Todd MRN: [id number] Date  of Birth: Dec 04, 1932

## 2019-12-24 ENCOUNTER — Other Ambulatory Visit: Payer: Self-pay

## 2019-12-24 ENCOUNTER — Ambulatory Visit: Payer: Medicare Other | Attending: Internal Medicine | Admitting: Physical Therapy

## 2019-12-24 ENCOUNTER — Encounter: Payer: Self-pay | Admitting: Physical Therapy

## 2019-12-24 DIAGNOSIS — M545 Low back pain: Secondary | ICD-10-CM | POA: Insufficient documentation

## 2019-12-24 DIAGNOSIS — G8929 Other chronic pain: Secondary | ICD-10-CM | POA: Diagnosis present

## 2019-12-24 DIAGNOSIS — M6281 Muscle weakness (generalized): Secondary | ICD-10-CM

## 2019-12-24 DIAGNOSIS — R2689 Other abnormalities of gait and mobility: Secondary | ICD-10-CM | POA: Insufficient documentation

## 2019-12-24 NOTE — Therapy (Signed)
[facility] [address] [place], [place], [address] Phone: [phone]   Fax:  [phone]  Physical Therapy Treatment  Patient Details  Name: Cameron Todd MRN: [id number] Date of Birth: July 29, 1932 Referring Provider (PT): Dr. Leanna Battles   Encounter Date: 12/24/2019  PT End of Session - 12/24/19 1156    Visit Number  5    Date for PT Re-Evaluation  01/13/20    Authorization Type  Medicare, AARP    PT Start Time  1140    PT Stop Time  1229    PT Time Calculation (min)  49 min    Activity Tolerance  Patient tolerated treatment well    Behavior During Therapy  [facility] for tasks assessed/performed       Past Medical History:  Diagnosis Date   Arthritis    Barrett's esophagus    Compression fracture of spine ([place])    T 10   Diabetes ([place])    Elevated cholesterol    Gastric ulcer 1990   Hemorrhoids    History of colon polyps    Hypertension    Tubular adenoma of colon     Past Surgical History:  Procedure Laterality Date   cataract surgery bilateral     NO PAST SURGERIES     UPPER GASTROINTESTINAL ENDOSCOPY      There were no vitals filed for this visit.  Subjective Assessment - 12/24/19 1157    Subjective  RT side back pain 2-3/10 today.    Currently in Pain?  Yes    Pain Score  3     Pain Location  Back    Pain Orientation  Right;Mid;Lower    Pain Descriptors / Indicators  Dull;Aching    Aggravating Factors   Fairly  a constant thing    Pain Relieving Factors  meds    Multiple Pain Sites  No                        [facility] Adult PT Treatment/Exercise - 12/24/19 0001      Lumbar Exercises: Aerobic   Nustep  L4 x 8 min review of status with PTA      Knee/Hip Exercises: Standing   Hip Abduction  Stengthening;Both;2 sets;10 reps;Knee straight    Hip Extension  Stengthening;Right;Left;1 set;15 reps    Extension Limitations  2# on each ankle      Knee/Hip  Exercises: Seated   Sit to Sand  --   shld hor abd red band 10x, then added sit to stand 10x     Shoulder Exercises: Supine   Other Supine Exercises  Collarbone lengthener 10x 3 sec hold   added to HEP   Other Supine Exercises  head press 3 sec hold 10x,    Added to HEP     Shoulder Exercises: Standing   Horizontal ABduction  Strengthening;Both;20 reps;Theraband    Theraband Level (Shoulder Horizontal ABduction)  Level 2 (Red)    Horizontal ABduction Limitations  added to HEP    Extension  Strengthening;Both;20 reps;Theraband    Theraband Level (Shoulder Extension)  Level 2 (Red)    Extension Limitations  added to HEP             PT Education - 12/24/19 1219    Education Details  HEP    Person(s) Educated  Patient    Methods  Explanation;Demonstration;Verbal cues;Handout    Comprehension  Returned demonstration;Verbalized understanding  PT Short Term Goals - 12/17/19 1507      PT SHORT TERM GOAL #2   Title  TUG score </= 18 sec    Time  4    Period  Weeks    Status  --   18 sec   Target Date  01/13/20        PT Long Term Goals - 11/18/19 1710      PT LONG TERM GOAL #1   Title  independent with HEP for postural strength and balance    Time  8    Period  Weeks    Status  New    Target Date  01/13/20      PT LONG TERM GOAL #2   Title  TUG score </= 13 seconds due to improved balance    Time  8    Period  Weeks    Status  New    Target Date  01/13/20      PT LONG TERM GOAL #3   Title  perform daily tasks  with  >/= 50%  ease due to improved mobility and decreased lumbar pain    Time  8    Period  Weeks    Status  New    Target Date  01/13/20      PT LONG TERM GOAL #4   Title  patient reports he walks with increased steadiness >/= 50% due to improve strength and posture    Baseline  --    Time  8    Period  Weeks    Status  New    Target Date  01/13/20      PT LONG TERM GOAL #5   Title  Dynamic gait test score >/= 19 due to increased  balance    Baseline  --    Time  8    Period  Weeks    Status  New    Target Date  01/13/20            Plan - 12/24/19 1156    Clinical Impression Statement  Pt reports no falls and a low level Rt low/mid back pain. HEP updated today and pt continues to practice Tai Chi a home. Pt demo imrpved steadiness when he turned his head side to side while walking.    Personal Factors and Comorbidities  Age;Fitness;Comorbidity 2;Time since onset of injury/illness/exacerbation    Comorbidities  Diabetes; fractured lumbar spine 2006    Examination-Activity Limitations  Locomotion Level;Stand;Sit;Reach Overhead    Examination-Participation Restrictions  Community Activity    Stability/Clinical Decision Making  Evolving/Moderate complexity    Rehab Potential  Good    PT Frequency  2x / week    PT Duration  8 weeks    PT Treatment/Interventions  Cryotherapy;Dentist;Therapeutic activities;Therapeutic exercise;Balance training;Neuromuscular re-education;Manual techniques;Patient/family education;Joint Manipulations    PT Next Visit Plan  nustep; pressing back into the mat for decompression, modalities as needed, engaging lower abdomin for core strength, walking with head movements; discuss posture    PT Home Exercise Plan  Access Code: [email or web address]    Consulted and Agree with Plan of Care  Patient       Patient will benefit from skilled therapeutic intervention in order to improve the following deficits and impairments:  Abnormal gait, Decreased range of motion, Increased fascial restricitons, Pain, Decreased strength, Difficulty walking  Visit Diagnosis: Muscle weakness (generalized)  Chronic bilateral low back pain without sciatica  Other abnormalities of gait and mobility  Problem List Patient Active Problem List   Diagnosis Date Noted   Barrett's esophagus 05/20/2013   HTN (hypertension) 05/20/2013   GENERALIZED  OSTEOARTHROSIS UNSPECIFIED SITE 06/10/2008   EPIGASTRIC PAIN 06/10/2008   GASTRIC ULCER, HX OF 06/10/2008   INTERNAL HEMORRHOIDS 06/08/2008    Nava Song, PTA 12/24/2019, 12:39 PM  [facility] [address] [place], [place], [address] Phone: [phone]   Fax:  [phone]  Name: Cameron Todd MRN: [id number] Date of Birth: Apr 20, 1933

## 2019-12-24 NOTE — Patient Instructions (Signed)
RE-ALIGNMENT ROUTINE EXERCISES-OSTEOPROROSIS BASIC FOR POSTURAL CORRECTION   RE-ALIGNMENT Tips BENEFITS: 1.It helps to re-align the curves of the back and improve standing posture. 2.It allows the back muscles to rest and strengthen in preparation for more activity. FREQUENCY: Daily, even after weeks, months and years of more advanced exercises. START: 1.All exercises start in the same position: lying on the back, arms resting on the supporting surface, palms up and slightly away from the body, backs of hands down, knees bent, feet flat. 2.The head, neck, arms, and legs are supported according to specific instructions of your therapist. Copyright  VHI. All rights reserved.    1. Decompression Exercise: Basic.   Takes compression off the vertebral bodies; increases tolerance for lying on the back; helps relieve back pain   Lie on back on firm surface, knees bent, feet flat, arms turned up, out to sides (~35 degrees). Head neck and arms supported as necessary. Time _5-15__ minutes. Surface: floor     2. Shoulder Press  Strengthens upper back extensors and scapular retractors.   Press both shoulders down. Hold _2-3__ seconds. Repeat _3-5__ times. Surface: floor        3. Head Press With Chin Tuck  Strengthens neck extensors   Tuck chin SLIGHTLY toward chest, keep mouth closed. Feel weight on back of head. Increase weight by pressing head down. Hold _2-3__ seconds. Relax. Repeat 3-5___ times. Surface: floor   4. Leg Lengthener: stretches quadratus lumborum and hip flexors.  Strengthens quads and ankle dorsiflexors.   

## 2020-01-19 ENCOUNTER — Encounter: Payer: Self-pay | Admitting: Physical Therapy

## 2020-01-19 ENCOUNTER — Other Ambulatory Visit: Payer: Self-pay

## 2020-01-19 ENCOUNTER — Ambulatory Visit: Payer: Medicare Other | Admitting: Physical Therapy

## 2020-01-19 DIAGNOSIS — M6281 Muscle weakness (generalized): Secondary | ICD-10-CM | POA: Diagnosis not present

## 2020-01-19 DIAGNOSIS — G8929 Other chronic pain: Secondary | ICD-10-CM

## 2020-01-19 DIAGNOSIS — R2689 Other abnormalities of gait and mobility: Secondary | ICD-10-CM

## 2020-01-19 NOTE — Therapy (Signed)
[facility] [address], [place], [place], [address] Phone: [phone]   Fax:  [phone]  Physical Therapy Treatment  Patient Details  Name: Cameron Todd MRN: [id number] Date of Birth: 1933/07/24 Referring Provider (PT): Dr. Leanna Battles   Encounter Date: 01/19/2020   PT End of Session - 01/19/20 1130    Visit Number 6    Date for PT Re-Evaluation 03/15/20    Authorization Type Medicare, AARP    PT Start Time 1100    PT Stop Time 1138    PT Time Calculation (min) 38 min    Activity Tolerance Patient tolerated treatment well    Behavior During Therapy [facility] for tasks assessed/performed           Past Medical History:  Diagnosis Date  . Arthritis   . Barrett's esophagus   . Compression fracture of spine ([place])    T 10  . Diabetes ([place])   . Elevated cholesterol   . Gastric ulcer 1990  . Hemorrhoids   . History of colon polyps   . Hypertension   . Tubular adenoma of colon     Past Surgical History:  Procedure Laterality Date  . cataract surgery bilateral    . NO PAST SURGERIES    . UPPER GASTROINTESTINAL ENDOSCOPY      There were no vitals filed for this visit.   Subjective Assessment - 01/19/20 1106    Subjective The pain is the same. Balance a little better.    Patient Stated Goals feel steady with walking, work with back pain    Currently in Pain? Yes    Pain Score 5     Pain Location Back    Pain Orientation Right;Mid    Pain Descriptors / Indicators Aching;Dull    Pain Type Chronic pain    Pain Onset More than a month ago    Pain Frequency Constant    Aggravating Factors  constant    Pain Relieving Factors medication    Multiple Pain Sites No              [facility] PT Assessment - 01/19/20 0001      Assessment   Medical Diagnosis R26.81 Unsteady Gait; M54.9 BAck pain    Referring Provider (PT) Dr. Leanna Battles    Prior Therapy none      Precautions   Precautions None       Restrictions   Weight Bearing Restrictions No      Home Environment   Living Environment Private residence      Cognition   Overall Cognitive Status Within Functional Limits for tasks assessed      Posture/Postural Control   Posture/Postural Control Postural limitations    Postural Limitations Forward head;Rounded Shoulders      ROM / Strength   AROM / PROM / Strength AROM;PROM;Strength      PROM   Right Hip External Rotation  45    Left Hip External Rotation  45      Strength   Left Ankle Dorsiflexion 5/5    Left Ankle Plantar Flexion 4-/5    Left Ankle Inversion 5/5    Left Ankle Eversion 5/5      Dynamic Gait Index   Level Surface Mild Impairment    Change in Gait Speed Normal    Gait with Horizontal Head Turns Mild Impairment    Gait with Vertical Head Turns Normal    Gait and Pivot Turn Mild Impairment  Step Over Obstacle Normal    Step Around Obstacles Mild Impairment    Steps Mild Impairment    Total Score 19      Timed Up and Go Test   TUG Normal TUG    Normal TUG (seconds) 13                         [facility] Adult PT Treatment/Exercise - 01/19/20 0001      Lumbar Exercises: Aerobic   Nustep L4 x 8 min review of status with PT      Knee/Hip Exercises: Seated   Sit to Sand 2 sets;10 reps;without UE support                    PT Short Term Goals - 01/19/20 1128      PT SHORT TERM GOAL #1   Time 4    Period Weeks    Status Achieved      PT SHORT TERM GOAL #2   Title TUG score </= 18 sec    Time 4    Period Weeks    Status Achieved      PT SHORT TERM GOAL #3   Title understand correct posture to reduce strain on back and decrease pain    Time 4    Period Weeks    Status Achieved             PT Long Term Goals - 01/19/20 1128      PT LONG TERM GOAL #1   Title independent with HEP for postural strength and balance    Time 8    Period Weeks    Status On-going      PT LONG TERM GOAL #2   Title TUG score  </= 13 seconds due to improved balance    Time 8    Period Weeks    Status Achieved      PT LONG TERM GOAL #3   Title perform daily tasks  with  >/= 50%  ease due to improved mobility and decreased lumbar pain    Period Weeks    Status On-going      PT LONG TERM GOAL #4   Title patient reports he walks with increased steadiness >/= 50% due to improve strength and posture    Period Weeks    Status On-going      PT LONG TERM GOAL #5   Title Dynamic gait test score >/= 21 due to increased balance    Time 8    Period Weeks    Status Revised                 Plan - 01/19/20 1131    Clinical Impression Statement Patient has increased TUG score to 13 seconds and met his goal. the Dynamic Gait Index is 19 point but still puts him at risk of falls so the goal has increased to 21 points. Patient has difficulty going from a low chair to standing and has to stand a moment to regain his balance. Patient has increased in bilateral hip ER P?ROM to 45 degrees and increased in left ankle strength. Patient has trouble with stand pivot and going up and down stairs and horiontal head turns. Patient will benefit from skilled therapy to improve his balance with Synamic Gait balance and sit to stand on low surface.    Personal Factors and Comorbidities Age;Fitness;Comorbidity 2;Time since onset of injury/illness/exacerbation    Comorbidities Diabetes;  fractured lumbar spine 2006    Examination-Activity Limitations Locomotion Level;Stand;Sit;Reach Overhead    Examination-Participation Restrictions Community Activity    Stability/Clinical Decision Making Evolving/Moderate complexity    Rehab Potential Good    PT Frequency 2x / week    PT Duration 8 weeks    PT Treatment/Interventions Cryotherapy;Dentist;Therapeutic activities;Therapeutic exercise;Balance training;Neuromuscular re-education;Manual techniques;Patient/family education;Joint  Manipulations    PT Next Visit Plan nustep; pressing back into the mat for decompression, modalities as needed, engaging lower abdomin for core strength, walking with head movements; discuss posture    PT Home Exercise Plan Access Code: [email or web address]    Recommended Other Services sent MD renewal on 01/19/2020    Consulted and Agree with Plan of Care Patient           Patient will benefit from skilled therapeutic intervention in order to improve the following deficits and impairments:  Abnormal gait, Decreased range of motion, Increased fascial restricitons, Pain, Decreased strength, Difficulty walking  Visit Diagnosis: Muscle weakness (generalized) - Plan: PT plan of care cert/re-cert  Chronic bilateral low back pain without sciatica - Plan: PT plan of care cert/re-cert  Other abnormalities of gait and mobility - Plan: PT plan of care cert/re-cert     Problem List Patient Active Problem List   Diagnosis Date Noted  . Barrett's esophagus 05/20/2013  . HTN (hypertension) 05/20/2013  . GENERALIZED OSTEOARTHROSIS UNSPECIFIED SITE 06/10/2008  . EPIGASTRIC PAIN 06/10/2008  . GASTRIC ULCER, HX OF 06/10/2008  . INTERNAL HEMORRHOIDS 06/08/2008    Earlie Counts, PT 01/19/20 11:44 AM   [facility] [address], [place], [place], [address] Phone: [phone]   Fax:  [phone]  Name: ZAKARY KIMURA MRN: [id number] Date of Birth: 1933/06/25

## 2020-01-21 ENCOUNTER — Other Ambulatory Visit: Payer: Self-pay

## 2020-01-21 ENCOUNTER — Encounter: Payer: Self-pay | Admitting: Physical Therapy

## 2020-01-21 ENCOUNTER — Ambulatory Visit: Payer: Medicare Other | Admitting: Physical Therapy

## 2020-01-21 DIAGNOSIS — M6281 Muscle weakness (generalized): Secondary | ICD-10-CM | POA: Diagnosis not present

## 2020-01-21 DIAGNOSIS — R2689 Other abnormalities of gait and mobility: Secondary | ICD-10-CM

## 2020-01-21 DIAGNOSIS — G8929 Other chronic pain: Secondary | ICD-10-CM

## 2020-01-21 NOTE — Therapy (Signed)
[facility] [address], [place], [place], [address] Phone: [phone]   Fax:  [phone]  Physical Therapy Treatment  Patient Details  Name: Cameron Todd MRN: [id number] Date of Birth: [birth year] Referring Provider (PT): Dr. Leanna Battles   Encounter Date: 01/21/2020   PT End of Session - 01/21/20 1135    Visit Number 7    Date for PT Re-Evaluation 03/15/20    Authorization Type Medicare, AARP    PT Start Time 1100    PT Stop Time 1138    PT Time Calculation (min) 38 min    Activity Tolerance Patient tolerated treatment well    Behavior During Therapy [facility] for tasks assessed/performed           Past Medical History:  Diagnosis Date  . Arthritis   . Barrett's esophagus   . Compression fracture of spine ([place])    T 10  . Diabetes ([place])   . Elevated cholesterol   . Gastric ulcer 1990  . Hemorrhoids   . History of colon polyps   . Hypertension   . Tubular adenoma of colon     Past Surgical History:  Procedure Laterality Date  . cataract surgery bilateral    . NO PAST SURGERIES    . UPPER GASTROINTESTINAL ENDOSCOPY      There were no vitals filed for this visit.   Subjective Assessment - 01/21/20 1107    Subjective I felt good after the last visit.    Patient Stated Goals feel steady with walking, work with back pain    Currently in Pain? Yes    Pain Score 5     Pain Location Back    Pain Orientation Right;Mid    Pain Descriptors / Indicators Aching;Dull    Pain Type Chronic pain    Pain Onset More than a month ago    Pain Frequency Constant    Aggravating Factors  constant    Pain Relieving Factors medication    Multiple Pain Sites No                             [facility] Adult PT Treatment/Exercise - 01/21/20 0001      Lumbar Exercises: Aerobic   Nustep L4 x 8 min review of status with PT      Lumbar Exercises: Seated   Other Seated Lumbar Exercises press in foam  roll to engage the lower abdominals holding for 5 sec 10x      Knee/Hip Exercises: Standing   Heel Raises Right;Left;1 set;10 reps    Heel Raises Limitations holding on with 2 hands    Other Standing Knee Exercises walking with head turns horizontally for balance, walk around  obstacles with one time kicking the obstacle with foot, walking stop pivot turn      Knee/Hip Exercises: Seated   Long Arc Quad Strengthening;Right;Left;1 set;20 reps;Weights    Long Arc Quad Weight 4 lbs.    Sit to Sand 2 sets;10 reps;without UE support   stand on foam roll     Shoulder Exercises: Standing   Horizontal ABduction Strengthening;Both;15 reps;Theraband   standing on foam    Theraband Level (Shoulder Horizontal ABduction) Level 2 (Red)    Flexion Strengthening;Right;Left;10 reps;Theraband   standing on foam   Extension Strengthening;Both;15 reps;Theraband   standing on foam    Theraband Level (Shoulder Extension) Level 2 (Red)  PT Short Term Goals - 01/19/20 1128      PT SHORT TERM GOAL #1   Time 4    Period Weeks    Status Achieved      PT SHORT TERM GOAL #2   Title TUG score </= 18 sec    Time 4    Period Weeks    Status Achieved      PT SHORT TERM GOAL #3   Title understand correct posture to reduce strain on back and decrease pain    Time 4    Period Weeks    Status Achieved             PT Long Term Goals - 01/19/20 1128      PT LONG TERM GOAL #1   Title independent with HEP for postural strength and balance    Time 8    Period Weeks    Status On-going      PT LONG TERM GOAL #2   Title TUG score </= 13 seconds due to improved balance    Time 8    Period Weeks    Status Achieved      PT LONG TERM GOAL #3   Title perform daily tasks  with  >/= 50%  ease due to improved mobility and decreased lumbar pain    Period Weeks    Status On-going      PT LONG TERM GOAL #4   Title patient reports he walks with increased steadiness >/= 50% due to  improve strength and posture    Period Weeks    Status On-going      PT LONG TERM GOAL #5   Title Dynamic gait test score >/= 21 due to increased balance    Time 8    Period Weeks    Status Revised                 Plan - 01/21/20 1136    Clinical Impression Statement Patient did better with head movements while walking keeping the same speed of gait. Patient kick one obect while doing fiqure 8 around obstacles. Patient felt his abdominals work with shoulder flexion. Patient is working on back strength while standing on foam to challenge his balance. Patient will benefit from skilled therapy to work on balance and overall strenght.    Personal Factors and Comorbidities Age;Fitness;Comorbidity 2;Time since onset of injury/illness/exacerbation    Comorbidities Diabetes; fractured lumbar spine 2006    Examination-Activity Limitations Locomotion Level;Stand;Sit;Reach Overhead    Examination-Participation Restrictions Community Activity    Stability/Clinical Decision Making Evolving/Moderate complexity    Rehab Potential Good    PT Frequency 2x / week    PT Duration 8 weeks    PT Treatment/Interventions Cryotherapy;Dentist;Therapeutic activities;Therapeutic exercise;Balance training;Neuromuscular re-education;Manual techniques;Patient/family education;Joint Manipulations    PT Next Visit Plan nustep, standing head turns, walk around obstacles, standing on foam arm exercises    PT Home Exercise Plan Access Code: [email or web address]    Consulted and Agree with Plan of Care Patient           Patient will benefit from skilled therapeutic intervention in order to improve the following deficits and impairments:  Abnormal gait, Decreased range of motion, Increased fascial restricitons, Pain, Decreased strength, Difficulty walking  Visit Diagnosis: Muscle weakness (generalized)  Chronic bilateral low back pain without sciatica  Other  abnormalities of gait and mobility     Problem List Patient Active Problem List   Diagnosis Date Noted  .  Barrett's esophagus 05/20/2013  . HTN (hypertension) 05/20/2013  . GENERALIZED OSTEOARTHROSIS UNSPECIFIED SITE 06/10/2008  . EPIGASTRIC PAIN 06/10/2008  . GASTRIC ULCER, HX OF 06/10/2008  . INTERNAL HEMORRHOIDS 06/08/2008    Earlie Counts, PT 01/21/20 11:40 AM   [facility] [address], [place], [place], [address] Phone: [phone]   Fax:  [phone]  Name: ERNST CUMPSTON MRN: [id number] Date of Birth: 1932/11/28

## 2020-01-28 ENCOUNTER — Ambulatory Visit: Payer: Medicare Other | Attending: Internal Medicine | Admitting: Physical Therapy

## 2020-01-28 ENCOUNTER — Encounter: Payer: Self-pay | Admitting: Physical Therapy

## 2020-01-28 ENCOUNTER — Other Ambulatory Visit: Payer: Self-pay

## 2020-01-28 DIAGNOSIS — R2689 Other abnormalities of gait and mobility: Secondary | ICD-10-CM | POA: Insufficient documentation

## 2020-01-28 DIAGNOSIS — M6281 Muscle weakness (generalized): Secondary | ICD-10-CM | POA: Diagnosis not present

## 2020-01-28 DIAGNOSIS — M545 Low back pain: Secondary | ICD-10-CM | POA: Insufficient documentation

## 2020-01-28 DIAGNOSIS — G8929 Other chronic pain: Secondary | ICD-10-CM | POA: Diagnosis not present

## 2020-01-28 NOTE — Therapy (Signed)
[facility] [address] [place], [place], [address] Phone: [phone]   Fax:  [phone]  Physical Therapy Treatment  Patient Details  Name: RAYMUND MANRIQUE MRN: [id number] Date of Birth: [birth year] Referring Provider (PT): Dr. Leanna Battles   Encounter Date: 01/28/2020   PT End of Session - 01/28/20 1100    Visit Number 8    Date for PT Re-Evaluation 03/15/20    Authorization Type Medicare, AARP    PT Start Time 1055    PT Stop Time 1139    PT Time Calculation (min) 44 min    Activity Tolerance Patient tolerated treatment well    Behavior During Therapy [facility] for tasks assessed/performed           Past Medical History:  Diagnosis Date  . Arthritis   . Barrett's esophagus   . Compression fracture of spine ([place])    T 10  . Diabetes ([place])   . Elevated cholesterol   . Gastric ulcer 1990  . Hemorrhoids   . History of colon polyps   . Hypertension   . Tubular adenoma of colon     Past Surgical History:  Procedure Laterality Date  . cataract surgery bilateral    . NO PAST SURGERIES    . UPPER GASTROINTESTINAL ENDOSCOPY      There were no vitals filed for this visit.   Subjective Assessment - 01/28/20 1101    Subjective Not sure why but my back is bothering me more today.    Patient Stated Goals feel steady with walking, work with back pain    Currently in Pain? Yes    Pain Score 6     Pain Location Back    Pain Orientation Right;Mid    Pain Descriptors / Indicators Sore    Aggravating Factors  constant    Pain Relieving Factors meds    Multiple Pain Sites No              [facility] PT Assessment - 01/28/20 0001      Timed Up and Go Test   Normal TUG (seconds) 13                         [facility] Adult PT Treatment/Exercise - 01/28/20 0001      High Level Balance   High Level Balance Activities --   59 feet walking with head turns 4x, min LOB, def < previous     Lumbar  Exercises: Aerobic   Nustep L4 x 10 min review of status with PTA      Lumbar Exercises: Seated   Other Seated Lumbar Exercises press in foam roll to engage the lower abdominals holding for 5 sec 10x   2sets, VC for more thoracic ext     Knee/Hip Exercises: Standing   Hip Extension Stengthening;Both;1 set;10 reps;Knee straight    Extension Limitations red band       Knee/Hip Exercises: Seated   Long Arc Quad Strengthening;Both;2 sets;10 reps;Weights    Long Arc Quad Weight 4 lbs.      Shoulder Exercises: Supine   Diagonals Strengthening;Both;10 reps;Theraband    Theraband Level (Shoulder Diagonals) Level 2 (Red)    Diagonals Limitations Seated with VC forposture      Shoulder Exercises: Standing   Horizontal ABduction Strengthening;Both;20 reps;Theraband    Theraband Level (Shoulder Horizontal ABduction) Level 2 (Red)   standing on black pad   Flexion Strengthening;Right;Left;10 reps;Theraband   standing  on foam & 1# wts, 10x abd bil                    PT Short Term Goals - 01/19/20 1128      PT SHORT TERM GOAL #1   Time 4    Period Weeks    Status Achieved      PT SHORT TERM GOAL #2   Title TUG score </= 18 sec    Time 4    Period Weeks    Status Achieved      PT SHORT TERM GOAL #3   Title understand correct posture to reduce strain on back and decrease pain    Time 4    Period Weeks    Status Achieved             PT Long Term Goals - 01/28/20 1126      PT LONG TERM GOAL #2   Title TUG score </= 13 seconds due to improved balance    Time 8    Period Weeks    Status Achieved                 Plan - 01/28/20 1100    Clinical Impression Statement Pt presented with higher back pain than last session, unsure why. PTagave pt heating pad during seated activites which was helpdul. Pt demonstrated minimal LOB with head turns than previous. Exercises did not increase any back pain. Pt demonstrates some wobbliness when standing on foam but no LOB  with very light CGA from PTA.    Personal Factors and Comorbidities Age;Fitness;Comorbidity 2;Time since onset of injury/illness/exacerbation    Comorbidities Diabetes; fractured lumbar spine 2006    Examination-Activity Limitations Locomotion Level;Stand;Sit;Reach Overhead    Examination-Participation Restrictions Community Activity    Stability/Clinical Decision Making Evolving/Moderate complexity    Rehab Potential Good    PT Frequency 2x / week    PT Duration 8 weeks    PT Treatment/Interventions Cryotherapy;Dentist;Therapeutic activities;Therapeutic exercise;Balance training;Neuromuscular re-education;Manual techniques;Patient/family education;Joint Manipulations    PT Next Visit Plan nustep, standing head turns, walk around obstacles, standing on foam arm exercises    PT Home Exercise Plan Access Code: [email or web address]           Patient will benefit from skilled therapeutic intervention in order to improve the following deficits and impairments:  Abnormal gait, Decreased range of motion, Increased fascial restricitons, Pain, Decreased strength, Difficulty walking  Visit Diagnosis: Muscle weakness (generalized)  Chronic bilateral low back pain without sciatica  Other abnormalities of gait and mobility     Problem List Patient Active Problem List   Diagnosis Date Noted  . Barrett's esophagus 05/20/2013  . HTN (hypertension) 05/20/2013  . GENERALIZED OSTEOARTHROSIS UNSPECIFIED SITE 06/10/2008  . EPIGASTRIC PAIN 06/10/2008  . GASTRIC ULCER, HX OF 06/10/2008  . INTERNAL HEMORRHOIDS 06/08/2008    Fedor Kazmierski, PTA 01/28/2020, 11:44 AM  [facility] [address], [place], [place], [address] Phone: [phone]   Fax:  [phone]  Name: ISIAH SCHEEL MRN: [id number] Date of Birth: [birth year]

## 2020-01-30 ENCOUNTER — Encounter: Payer: Self-pay | Admitting: Physical Therapy

## 2020-01-30 ENCOUNTER — Ambulatory Visit: Payer: Medicare Other | Admitting: Physical Therapy

## 2020-01-30 ENCOUNTER — Other Ambulatory Visit: Payer: Self-pay

## 2020-01-30 DIAGNOSIS — R2689 Other abnormalities of gait and mobility: Secondary | ICD-10-CM

## 2020-01-30 DIAGNOSIS — G8929 Other chronic pain: Secondary | ICD-10-CM | POA: Diagnosis not present

## 2020-01-30 DIAGNOSIS — M545 Low back pain, unspecified: Secondary | ICD-10-CM

## 2020-01-30 DIAGNOSIS — M6281 Muscle weakness (generalized): Secondary | ICD-10-CM | POA: Diagnosis not present

## 2020-01-30 NOTE — Therapy (Signed)
[facility] [address], [place], [place], [address] Phone: [phone]   Fax:  [phone]  Physical Therapy Treatment  Patient Details  Name: Cameron Todd MRN: [id number] Date of Birth: [birth year] Referring Provider (PT): Dr. Leanna Battles   Encounter Date: 01/30/2020   PT End of Session - 01/30/20 1122    Visit Number 9    Date for PT Re-Evaluation 03/15/20    Authorization Type Medicare, AARP    PT Start Time 1100    PT Stop Time 1138    PT Time Calculation (min) 38 min    Activity Tolerance Patient tolerated treatment well    Behavior During Therapy [facility] for tasks assessed/performed           Past Medical History:  Diagnosis Date  . Arthritis   . Barrett's esophagus   . Compression fracture of spine ([place])    T 10  . Diabetes ([place])   . Elevated cholesterol   . Gastric ulcer 1990  . Hemorrhoids   . History of colon polyps   . Hypertension   . Tubular adenoma of colon     Past Surgical History:  Procedure Laterality Date  . cataract surgery bilateral    . NO PAST SURGERIES    . UPPER GASTROINTESTINAL ENDOSCOPY      There were no vitals filed for this visit.   Subjective Assessment - 01/30/20 1110    Subjective I feel my arthritis is getting worse. My back feels worse today.    Patient Stated Goals feel steady with walking, work with back pain    Currently in Pain? Yes    Pain Score 6     Pain Location Back    Pain Orientation Right;Mid    Pain Descriptors / Indicators Sore    Pain Type Chronic pain    Pain Onset More than a month ago    Pain Frequency Constant    Aggravating Factors  constant    Pain Relieving Factors meds    Multiple Pain Sites No                             [facility] Adult PT Treatment/Exercise - 01/30/20 0001      Lumbar Exercises: Aerobic   Nustep L4 x 8 min review of status with PT      Knee/Hip Exercises: Standing   Hip Abduction  Stengthening;Right;Left;1 set;10 reps;Knee straight   on foam   Abduction Limitations red band, vc to push toes inot foam so he is not leaning backward    Hip Extension Stengthening;Both;1 set;10 reps;Knee straight    Extension Limitations red band , VC to press toes into the foam so he is ot leaning backwards   on foam    Other Standing Knee Exercises walking with looking up and down and side to side      Knee/Hip Exercises: Seated   Long Arc Quad Strengthening;Both;2 sets;10 reps;Weights    Long Arc Quad Weight 5 lbs.      Shoulder Exercises: Standing   Horizontal ABduction Strengthening;Both;20 reps;Theraband    Theraband Level (Shoulder Horizontal ABduction) Level 2 (Red)   standing on black pad   Extension Strengthening;Both;15 reps;Theraband   standing on foam    Theraband Level (Shoulder Extension) Level 2 (Red)    Other Standing Exercises standing on foam with one foot ahead of other and move band side to side  PT Short Term Goals - 01/19/20 1128      PT SHORT TERM GOAL #1   Time 4    Period Weeks    Status Achieved      PT SHORT TERM GOAL #2   Title TUG score </= 18 sec    Time 4    Period Weeks    Status Achieved      PT SHORT TERM GOAL #3   Title understand correct posture to reduce strain on back and decrease pain    Time 4    Period Weeks    Status Achieved             PT Long Term Goals - 01/28/20 1126      PT LONG TERM GOAL #2   Title TUG score </= 13 seconds due to improved balance    Time 8    Period Weeks    Status Achieved                 Plan - 01/30/20 1123    Clinical Impression Statement Patient needs VC to place more weight on his toes when on the foam due to him leaning backwards with exercise. During therapy, patient is standing on black foam to challenge his balance more. Patient has increased his leg weights to 5 pounds instead of 4 pounds. Patient is doing better about keeping his balance with  walking and head turns. Patient will benefit from skilled therapy to improve balance and strength.    Personal Factors and Comorbidities Age;Fitness;Comorbidity 2;Time since onset of injury/illness/exacerbation    Comorbidities Diabetes; fractured lumbar spine 2006    Examination-Activity Limitations Locomotion Level;Stand;Sit;Reach Overhead    Examination-Participation Restrictions Community Activity    Stability/Clinical Decision Making Evolving/Moderate complexity    Rehab Potential Good    PT Frequency 2x / week    PT Duration 8 weeks    PT Treatment/Interventions Cryotherapy;Dentist;Therapeutic activities;Therapeutic exercise;Balance training;Neuromuscular re-education;Manual techniques;Patient/family education;Joint Manipulations    PT Next Visit Plan nustep, standing head turns, walk around obstacles, standing on foam arm exercises; write up 10th visit    PT Home Exercise Plan Access Code: [email or web address]    Consulted and Agree with Plan of Care Patient           Patient will benefit from skilled therapeutic intervention in order to improve the following deficits and impairments:  Abnormal gait, Decreased range of motion, Increased fascial restricitons, Pain, Decreased strength, Difficulty walking  Visit Diagnosis: Muscle weakness (generalized)  Chronic bilateral low back pain without sciatica  Other abnormalities of gait and mobility     Problem List Patient Active Problem List   Diagnosis Date Noted  . Barrett's esophagus 05/20/2013  . HTN (hypertension) 05/20/2013  . GENERALIZED OSTEOARTHROSIS UNSPECIFIED SITE 06/10/2008  . EPIGASTRIC PAIN 06/10/2008  . GASTRIC ULCER, HX OF 06/10/2008  . INTERNAL HEMORRHOIDS 06/08/2008    Earlie Counts, PT 01/30/20 11:40 AM   [facility] [address] [place], [place], [address] Phone: [phone]   Fax:   [phone]  Name: Cameron Todd MRN: [id number] Date of Birth: 09/04/1932

## 2020-02-12 ENCOUNTER — Other Ambulatory Visit: Payer: Self-pay

## 2020-02-12 ENCOUNTER — Encounter: Payer: Self-pay | Admitting: Physical Therapy

## 2020-02-12 ENCOUNTER — Ambulatory Visit: Payer: Medicare Other | Admitting: Physical Therapy

## 2020-02-12 DIAGNOSIS — G8929 Other chronic pain: Secondary | ICD-10-CM

## 2020-02-12 DIAGNOSIS — R2689 Other abnormalities of gait and mobility: Secondary | ICD-10-CM

## 2020-02-12 DIAGNOSIS — M545 Low back pain: Secondary | ICD-10-CM | POA: Diagnosis not present

## 2020-02-12 DIAGNOSIS — M6281 Muscle weakness (generalized): Secondary | ICD-10-CM | POA: Diagnosis not present

## 2020-02-12 NOTE — Therapy (Signed)
[facility] [address] [place], [place], [address] Phone: [phone]   Fax:  [phone]  Physical Therapy Treatment  Patient Details  Name: Cameron Todd MRN: [id number] Date of Birth: June 26, 1933 Referring Provider (PT): Dr. Leanna Battles  Progress Note Reporting Period 11/18/2019 to 02/12/2020  See note below for Objective Data and Assessment of Progress/Goals.      Encounter Date: 02/12/2020   PT End of Session - 02/12/20 1656    Visit Number 10    Date for PT Re-Evaluation 03/15/20    Authorization Type Medicare, AARP    PT Start Time 1615    PT Stop Time 1653    PT Time Calculation (min) 38 min    Activity Tolerance Patient tolerated treatment well    Behavior During Therapy WFL for tasks assessed/performed           Past Medical History:  Diagnosis Date  . Arthritis   . Barrett's esophagus   . Compression fracture of spine ([place])    T 10  . Diabetes ([place])   . Elevated cholesterol   . Gastric ulcer 1990  . Hemorrhoids   . History of colon polyps   . Hypertension   . Tubular adenoma of colon     Past Surgical History:  Procedure Laterality Date  . cataract surgery bilateral    . NO PAST SURGERIES    . UPPER GASTROINTESTINAL ENDOSCOPY      There were no vitals filed for this visit.   Subjective Assessment - 02/12/20 1618    Subjective I feel average due to back pain. It is there all the time.    Patient Stated Goals feel steady with walking, work with back pain    Currently in Pain? Yes    Pain Score 6     Pain Location Back    Pain Orientation Right;Mid    Pain Descriptors / Indicators Sore    Pain Type Chronic pain    Pain Onset More than a month ago    Pain Frequency Constant    Aggravating Factors  constant    Pain Relieving Factors meds    Multiple Pain Sites No              [facility] PT Assessment - 02/12/20 0001      Assessment   Medical Diagnosis R26.81 Unsteady  Gait; M54.9 BAck pain    Referring Provider (PT) Dr. Leanna Battles    Prior Therapy none      Precautions   Precautions None      [place] residence      Prior Function   Level of Independence Independent    Vocation Retired      Associate Professor   Overall Cognitive Status Within Functional Limits for tasks assessed      Posture/Postural Control   Posture/Postural Control Postural limitations    Postural Limitations Forward head;Rounded Shoulders      PROM   Right Hip External Rotation  45    Left Hip External Rotation  45      Strength   Left Ankle Dorsiflexion 5/5    Left Ankle Plantar Flexion 4-/5    Left Ankle Inversion 5/5    Left Ankle Eversion 5/5      Dynamic Gait Index   Level Surface Mild Impairment    Change in Gait Speed Normal    Gait with Horizontal Head Turns Mild Impairment    Gait  with Vertical Head Turns Normal    Gait and Pivot Turn Mild Impairment    Step Over Obstacle Normal    Step Around Obstacles Normal    Steps Mild Impairment    Total Score 20      Timed Up and Go Test   Normal TUG (seconds) 13                         [facility] Adult PT Treatment/Exercise - 02/12/20 0001      Lumbar Exercises: Aerobic   Nustep L4 x 9 min review of status with PT      Knee/Hip Exercises: Standing   Hip Abduction Stengthening;Right;Left;1 set;10 reps;Knee straight   on foam   Abduction Limitations yellow loop, on foam, hold onto railing    Hip Extension Stengthening;Both;1 set;10 reps;Knee straight    Extension Limitations yellow loop, hold onto railing, on foam    Other Standing Knee Exercises step on foam and go over 10x      Shoulder Exercises: Standing   Extension Strengthening;Both;15 reps;Theraband   standing on foam    Theraband Level (Shoulder Extension) Level 2 (Red)                    PT Short Term Goals - 01/19/20 1128      PT SHORT TERM GOAL #1   Time 4    Period Weeks     Status Achieved      PT SHORT TERM GOAL #2   Title TUG score </= 18 sec    Time 4    Period Weeks    Status Achieved      PT SHORT TERM GOAL #3   Title understand correct posture to reduce strain on back and decrease pain    Time 4    Period Weeks    Status Achieved             PT Long Term Goals - 02/12/20 1622      PT LONG TERM GOAL #1   Title independent with HEP for postural strength and balance    Time 8    Status On-going      PT LONG TERM GOAL #2   Title TUG score </= 13 seconds due to improved balance    Time 8    Period Weeks    Status Achieved      PT LONG TERM GOAL #3   Title perform daily tasks  with  >/= 50%  ease due to improved mobility and decreased lumbar pain    Baseline 20% easier    Time 8    Period Weeks    Status On-going      PT LONG TERM GOAL #4   Title patient reports he walks with increased steadiness >/= 50% due to improve strength and posture    Baseline none yet    Time 8    Period Weeks    Status On-going      PT LONG TERM GOAL #5   Title Dynamic gait test score >/= 21 due to increased balance    Baseline 20 points    Time 8    Period Weeks    Status On-going                 Plan - 02/12/20 1657    Clinical Impression Statement Dynamic Gait index improved to 20 points. His timed up and go is 13 seconds. Patient has increased  strength of his ankles. Patient does not feel steady on his feet. Patient has trouble with balance when he turns his head  side to side. Patient walks with small steps. Patient has trouble with walking fast and slow. Patient reports it is 20% easier to do daily tasks. Patient will benefit from skilled therapy to improve balance and strength.    Personal Factors and Comorbidities Age;Fitness;Comorbidity 2;Time since onset of injury/illness/exacerbation    Comorbidities Diabetes; fractured lumbar spine 2006    Examination-Activity Limitations Locomotion Level;Stand;Sit;Reach Overhead     Examination-Participation Restrictions Community Activity    Stability/Clinical Decision Making Evolving/Moderate complexity    Rehab Potential Good    PT Frequency 2x / week    PT Duration 8 weeks    PT Treatment/Interventions Cryotherapy;Dentist;Therapeutic activities;Therapeutic exercise;Balance training;Neuromuscular re-education;Manual techniques;Patient/family education;Joint Manipulations    PT Next Visit Plan nustep, standing head turns, walk around obstacles, standing on foam arm exercises;    PT Home Exercise Plan Access Code: [email or web address]    Consulted and Agree with Plan of Care Patient           Patient will benefit from skilled therapeutic intervention in order to improve the following deficits and impairments:  Abnormal gait, Decreased range of motion, Increased fascial restricitons, Pain, Decreased strength, Difficulty walking  Visit Diagnosis: Muscle weakness (generalized)  Chronic bilateral low back pain without sciatica  Other abnormalities of gait and mobility     Problem List Patient Active Problem List   Diagnosis Date Noted  . Barrett's esophagus 05/20/2013  . HTN (hypertension) 05/20/2013  . GENERALIZED OSTEOARTHROSIS UNSPECIFIED SITE 06/10/2008  . EPIGASTRIC PAIN 06/10/2008  . GASTRIC ULCER, HX OF 06/10/2008  . INTERNAL HEMORRHOIDS 06/08/2008    Earlie Counts, PT 02/12/20 5:02 PM   [facility] [address] [place], [place], [address] Phone: [phone]   Fax:  [phone]  Name: Cameron Todd MRN: [id number] Date of Birth: 1932-09-20

## 2020-02-18 ENCOUNTER — Other Ambulatory Visit: Payer: Self-pay

## 2020-02-18 ENCOUNTER — Encounter: Payer: Self-pay | Admitting: Physical Therapy

## 2020-02-18 ENCOUNTER — Ambulatory Visit: Payer: Medicare Other | Admitting: Physical Therapy

## 2020-02-18 DIAGNOSIS — M545 Low back pain, unspecified: Secondary | ICD-10-CM

## 2020-02-18 DIAGNOSIS — M6281 Muscle weakness (generalized): Secondary | ICD-10-CM | POA: Diagnosis not present

## 2020-02-18 DIAGNOSIS — G8929 Other chronic pain: Secondary | ICD-10-CM | POA: Diagnosis not present

## 2020-02-18 DIAGNOSIS — R2689 Other abnormalities of gait and mobility: Secondary | ICD-10-CM

## 2020-02-18 NOTE — Therapy (Signed)
[facility] [address] [place], [place], [address] Phone: [phone]   Fax:  [phone]  Physical Therapy Treatment  Patient Details  Name: Cameron Todd MRN: [id number] Date of Birth: 1933-06-18 Referring Provider (PT): Dr. Leanna Battles   Encounter Date: 02/18/2020   PT End of Session - 02/18/20 1435    Visit Number 11    Date for PT Re-Evaluation 03/15/20    Authorization Type Medicare, AARP    PT Start Time 1400    PT Stop Time 1450    PT Time Calculation (min) 50 min    Activity Tolerance Patient tolerated treatment well    Behavior During Therapy [facility] for tasks assessed/performed           Past Medical History:  Diagnosis Date  . Arthritis   . Barrett's esophagus   . Compression fracture of spine ([place])    T 10  . Diabetes ([place])   . Elevated cholesterol   . Gastric ulcer 1990  . Hemorrhoids   . History of colon polyps   . Hypertension   . Tubular adenoma of colon     Past Surgical History:  Procedure Laterality Date  . cataract surgery bilateral    . NO PAST SURGERIES    . UPPER GASTROINTESTINAL ENDOSCOPY      There were no vitals filed for this visit.   Subjective Assessment - 02/18/20 1410    Subjective Today my back is hurting.    Patient Stated Goals feel steady with walking, work with back pain    Currently in Pain? Yes    Pain Score 7     Pain Location Back    Pain Orientation Mid;Lower    Pain Descriptors / Indicators Sore    Pain Type Chronic pain    Pain Onset More than a month ago    Pain Frequency Constant    Aggravating Factors  constant    Pain Relieving Factors medication    Multiple Pain Sites No                             [facility] Adult PT Treatment/Exercise - 02/18/20 0001      Lumbar Exercises: Aerobic   Nustep L4 x 9 min review of status with PT      Modalities   Modalities Electrical Stimulation;Moist Heat      Moist Heat Therapy    Number Minutes Moist Heat 15 Minutes    Moist Heat Location Lumbar Spine      Electrical Stimulation   Electrical Stimulation Location lumbar   prone   Electrical Stimulation Action IFC    Electrical Stimulation Parameters to patient tolerance, 15 mintues    Electrical Stimulation Goals Pain;Tone      Manual Therapy   Manual Therapy Soft tissue mobilization    Soft tissue mobilization soft tissue work to thoracic lumbar area to redue pain in prone                     PT Short Term Goals - 01/19/20 1128      PT SHORT TERM GOAL #1   Time 4    Period Weeks    Status Achieved      PT SHORT TERM GOAL #2   Title TUG score </= 18 sec    Time 4    Period Weeks    Status Achieved  PT SHORT TERM GOAL #3   Title understand correct posture to reduce strain on back and decrease pain    Time 4    Period Weeks    Status Achieved             PT Long Term Goals - 02/12/20 1622      PT LONG TERM GOAL #1   Title independent with HEP for postural strength and balance    Time 8    Status On-going      PT LONG TERM GOAL #2   Title TUG score </= 13 seconds due to improved balance    Time 8    Period Weeks    Status Achieved      PT LONG TERM GOAL #3   Title perform daily tasks  with  >/= 50%  ease due to improved mobility and decreased lumbar pain    Baseline 20% easier    Time 8    Period Weeks    Status On-going      PT LONG TERM GOAL #4   Title patient reports he walks with increased steadiness >/= 50% due to improve strength and posture    Baseline none yet    Time 8    Period Weeks    Status On-going      PT LONG TERM GOAL #5   Title Dynamic gait test score >/= 21 due to increased balance    Baseline 20 points    Time 8    Period Weeks    Status On-going                 Plan - 02/18/20 1411    Clinical Impression Statement Patient is complaining of back pain and wants [place] to focus on his pain today. He is doing well with his exercises at  home. Patient was placed in prone for manual work and modalities to assist in elongation of the anterior trunk and increase extension. Patient will benefit from skilled therapy to improve balance, strength and pain.    Personal Factors and Comorbidities Age;Fitness;Comorbidity 2;Time since onset of injury/illness/exacerbation    Comorbidities Diabetes; fractured lumbar spine 2006    Examination-Activity Limitations Locomotion Level;Stand;Sit;Reach Overhead    Examination-Participation Restrictions Community Activity    Stability/Clinical Decision Making Evolving/Moderate complexity    Rehab Potential Good    PT Frequency 2x / week    PT Duration 8 weeks    PT Treatment/Interventions Cryotherapy;Dentist;Therapeutic activities;Therapeutic exercise;Balance training;Neuromuscular re-education;Manual techniques;Patient/family education;Joint Manipulations    PT Next Visit Plan nustep, standing head turns, walk around obstacles, standing on foam arm exercises; last visit scheduled, see if he wants to continue    PT Home Exercise Plan Access Code: [email or web address]    Consulted and Agree with Plan of Care Patient           Patient will benefit from skilled therapeutic intervention in order to improve the following deficits and impairments:  Abnormal gait, Decreased range of motion, Increased fascial restricitons, Pain, Decreased strength, Difficulty walking  Visit Diagnosis: Muscle weakness (generalized)  Chronic bilateral low back pain without sciatica  Other abnormalities of gait and mobility     Problem List Patient Active Problem List   Diagnosis Date Noted  . Barrett's esophagus 05/20/2013  . HTN (hypertension) 05/20/2013  . GENERALIZED OSTEOARTHROSIS UNSPECIFIED SITE 06/10/2008  . EPIGASTRIC PAIN 06/10/2008  . GASTRIC ULCER, HX OF 06/10/2008  . INTERNAL HEMORRHOIDS 06/08/2008    Earlie Counts, PT 02/18/20  2:38 PM   [facility] [address], [place], [place], [address] Phone: [phone]   Fax:  [phone]  Name: GRANVIL DJORDJEVIC MRN: [id number] Date of Birth: [birth year]

## 2020-02-20 ENCOUNTER — Ambulatory Visit: Payer: Medicare Other | Admitting: Physical Therapy

## 2020-02-20 ENCOUNTER — Encounter: Payer: Self-pay | Admitting: Physical Therapy

## 2020-02-20 ENCOUNTER — Other Ambulatory Visit: Payer: Self-pay

## 2020-02-20 DIAGNOSIS — M6281 Muscle weakness (generalized): Secondary | ICD-10-CM | POA: Diagnosis not present

## 2020-02-20 DIAGNOSIS — G8929 Other chronic pain: Secondary | ICD-10-CM | POA: Diagnosis not present

## 2020-02-20 DIAGNOSIS — R2689 Other abnormalities of gait and mobility: Secondary | ICD-10-CM

## 2020-02-20 DIAGNOSIS — M545 Low back pain: Secondary | ICD-10-CM | POA: Diagnosis not present

## 2020-02-20 NOTE — Therapy (Signed)
[facility] [address], [place], [place], [address] Phone: [phone]   Fax:  [phone]  Physical Therapy Treatment  Patient Details  Name: Cameron Todd MRN: [id number] Date of Birth: 1933/04/20 Referring Provider (PT): Dr. Leanna Battles   Encounter Date: 02/20/2020   PT End of Session - 02/20/20 1007    Visit Number 12    Date for PT Re-Evaluation 03/15/20    Authorization Type Medicare, AARP    PT Start Time 0930    PT Stop Time 1020    PT Time Calculation (min) 50 min    Activity Tolerance Patient tolerated treatment well    Behavior During Therapy [facility] for tasks assessed/performed           Past Medical History:  Diagnosis Date   Arthritis    Barrett's esophagus    Compression fracture of spine ([place])    T 10   Diabetes ([place])    Elevated cholesterol    Gastric ulcer 1990   Hemorrhoids    History of colon polyps    Hypertension    Tubular adenoma of colon     Past Surgical History:  Procedure Laterality Date   cataract surgery bilateral     NO PAST SURGERIES     UPPER GASTROINTESTINAL ENDOSCOPY      There were no vitals filed for this visit.   Subjective Assessment - 02/20/20 0942    Subjective I still have the back pain.    Patient Stated Goals feel steady with walking, work with back pain    Currently in Pain? Yes    Pain Score 7     Pain Location Back    Pain Orientation Mid;Lower    Pain Descriptors / Indicators Sore    Pain Type Chronic pain    Pain Onset More than a month ago    Pain Frequency Constant    Aggravating Factors  constant    Pain Relieving Factors medication    Multiple Pain Sites No              [facility] PT Assessment - 02/20/20 0001      Dynamic Gait Index   Level Surface Normal    Change in Gait Speed Normal    Gait with Horizontal Head Turns Mild Impairment    Gait with Vertical Head Turns Normal    Gait and Pivot Turn Mild Impairment     Step Over Obstacle Normal    Step Around Obstacles Normal    Steps Mild Impairment    Total Score 21      Timed Up and Go Test   Normal TUG (seconds) 13                         [facility] Adult PT Treatment/Exercise - 02/20/20 0001      Modalities   Modalities Electrical Stimulation;Moist Heat      Moist Heat Therapy   Moist Heat Location Lumbar Spine      Electrical Stimulation   Electrical Stimulation Location lumbar   prone   Electrical Stimulation Action IFC    Electrical Stimulation Parameters to patient tolerance, 15 minutes    Electrical Stimulation Goals Pain;Tone                  PT Education - 02/20/20 1006    Education Details educated patient where to get a home TENS unit    Person(s) Educated Patient  Methods Explanation    Comprehension Verbalized understanding            PT Short Term Goals - 01/19/20 1128      PT SHORT TERM GOAL #1   Time 4    Period Weeks    Status Achieved      PT SHORT TERM GOAL #2   Title TUG score </= 18 sec    Time 4    Period Weeks    Status Achieved      PT SHORT TERM GOAL #3   Title understand correct posture to reduce strain on back and decrease pain    Time 4    Period Weeks    Status Achieved             PT Long Term Goals - 02/20/20 6433      PT LONG TERM GOAL #2   Title TUG score </= 13 seconds due to improved balance    Time 8    Period Weeks    Status Achieved      PT LONG TERM GOAL #3   Title perform daily tasks  with  >/= 50%  ease due to improved mobility and decreased lumbar pain    Baseline 20% easier    Time 8    Period Weeks    Status On-going      PT LONG TERM GOAL #4   Title patient reports he walks with increased steadiness >/= 50% due to improve strength and posture    Baseline none yet    Time 8    Period Weeks    Status On-going      PT LONG TERM GOAL #5   Title Dynamic gait test score >/= 21 due to increased balance    Baseline 21 points    Time 8      Period Weeks    Status Achieved                 Plan - 02/20/20 1008    Clinical Impression Statement Patient has improved in Dynamic Gait index to 21 points. Pateint is walking steadier. He is able to go up and down the steps with an alternating gait pattern but at times will lean to one side. Patient continues to have back pain so therapist suggested a home TENS unit. Patient will be purchasing one. Patient will benefit from one more session to review his HEP and be educated on home TENS unit.    Personal Factors and Comorbidities Age;Fitness;Comorbidity 2;Time since onset of injury/illness/exacerbation    Comorbidities Diabetes; fractured lumbar spine 2006    Examination-Activity Limitations Locomotion Level;Stand;Sit;Reach Overhead    Examination-Participation Restrictions Community Activity    Stability/Clinical Decision Making Evolving/Moderate complexity    Rehab Potential Good    PT Frequency 2x / week    PT Duration 8 weeks    PT Treatment/Interventions Cryotherapy;Dentist;Therapeutic activities;Therapeutic exercise;Balance training;Neuromuscular re-education;Manual techniques;Patient/family education;Joint Manipulations    PT Next Visit Plan educate  on home TENS unit; go over HEP; discharge next visit    PT Home Exercise Plan Access Code: [email or web address]    Consulted and Agree with Plan of Care Patient           Patient will benefit from skilled therapeutic intervention in order to improve the following deficits and impairments:  Abnormal gait, Decreased range of motion, Increased fascial restricitons, Pain, Decreased strength, Difficulty walking  Visit Diagnosis: Muscle weakness (generalized)  Chronic bilateral low back pain  without sciatica  Other abnormalities of gait and mobility     Problem List Patient Active Problem List   Diagnosis Date Noted   Barrett's esophagus 05/20/2013   HTN (hypertension)  05/20/2013   GENERALIZED OSTEOARTHROSIS UNSPECIFIED SITE 06/10/2008   EPIGASTRIC PAIN 06/10/2008   GASTRIC ULCER, HX OF 06/10/2008   INTERNAL HEMORRHOIDS 06/08/2008    Earlie Counts, PT 02/20/20 10:13 AM    [facility] [address], [place], [place], [address] Phone: [phone]   Fax:  [phone]  Name: Cameron Todd MRN: [id number] Date of Birth: 06/23/1933

## 2020-02-25 ENCOUNTER — Other Ambulatory Visit: Payer: Self-pay

## 2020-02-25 ENCOUNTER — Encounter: Payer: Self-pay | Admitting: Physical Therapy

## 2020-02-25 ENCOUNTER — Ambulatory Visit: Payer: Medicare Other | Attending: Internal Medicine | Admitting: Physical Therapy

## 2020-02-25 DIAGNOSIS — M6281 Muscle weakness (generalized): Secondary | ICD-10-CM | POA: Insufficient documentation

## 2020-02-25 DIAGNOSIS — R2689 Other abnormalities of gait and mobility: Secondary | ICD-10-CM | POA: Diagnosis not present

## 2020-02-25 DIAGNOSIS — G8929 Other chronic pain: Secondary | ICD-10-CM

## 2020-02-25 DIAGNOSIS — M545 Low back pain: Secondary | ICD-10-CM | POA: Diagnosis not present

## 2020-02-25 NOTE — Therapy (Signed)
[facility] [address], [place], [place], [address] Phone: [phone]   Fax:  [phone]  Physical Therapy Treatment  Patient Details  Name: Cameron Todd MRN: [id number] Date of Birth: 01/02/1933 Referring Provider (PT): Dr. Leanna Battles   Encounter Date: 02/25/2020   PT End of Session - 02/25/20 0938    Visit Number 13    Date for PT Re-Evaluation 03/15/20    Authorization Type Medicare, AARP    PT Start Time 0935    PT Stop Time 1013    PT Time Calculation (min) 38 min    Activity Tolerance Patient tolerated treatment well    Behavior During Therapy [facility] for tasks assessed/performed           Past Medical History:  Diagnosis Date  . Arthritis   . Barrett's esophagus   . Compression fracture of spine ([place])    T 10  . Diabetes ([place])   . Elevated cholesterol   . Gastric ulcer 1990  . Hemorrhoids   . History of colon polyps   . Hypertension   . Tubular adenoma of colon     Past Surgical History:  Procedure Laterality Date  . cataract surgery bilateral    . NO PAST SURGERIES    . UPPER GASTROINTESTINAL ENDOSCOPY      There were no vitals filed for this visit.   Subjective Assessment - 02/25/20 0937    Subjective I have the TENS unit. It is my daughters and she is not using it.    Patient Stated Goals feel steady with walking, work with back pain    Currently in Pain? Yes    Pain Score 7     Pain Location Back    Pain Orientation Mid;Lower    Pain Descriptors / Indicators Sore    Pain Type Chronic pain    Pain Onset More than a month ago    Pain Frequency Constant    Aggravating Factors  constant    Pain Relieving Factors medication    Multiple Pain Sites No              [facility] PT Assessment - 02/25/20 0001      Assessment   Medical Diagnosis R26.81 Unsteady Gait; M54.9 BAck pain    Referring Provider (PT) Dr. Leanna Battles    Prior Therapy none      Precautions    Precautions None      Restrictions   Weight Bearing Restrictions No      Prior Function   Level of Independence Independent      Cognition   Overall Cognitive Status Within Functional Limits for tasks assessed      Posture/Postural Control   Posture/Postural Control Postural limitations    Postural Limitations Forward head;Rounded Shoulders      PROM   Right Hip External Rotation  45    Left Hip External Rotation  45      Strength   Left Ankle Dorsiflexion 5/5    Left Ankle Plantar Flexion 4/5    Left Ankle Inversion 5/5    Left Ankle Eversion 5/5                         [facility] Adult PT Treatment/Exercise - 02/25/20 0001      Self-Care   Self-Care Other Self-Care Comments    Other Self-Care Comments  instructed pateint on how to don/doff the electodes, how to place the  elctrodes, how to store them on the plastic; how to adjust the intensity of the unit, how to chane the mode and turn the iunit on and off      Lumbar Exercises: Aerobic   Nustep L4 x 9 min review of status with PT                  PT Education - 02/25/20 1002    Education Details instructions on how to use the TENS unit, how to change the mode, how to adijust the time, how to don and doff the electrodes    Person(s) Educated Patient    Methods Explanation;Demonstration;Handout    Comprehension Returned demonstration;Verbalized understanding            PT Short Term Goals - 02/25/20 0940      PT SHORT TERM GOAL #1   Title independent with initial HEP    Time 4    Period Weeks    Status Achieved      PT SHORT TERM GOAL #2   Title TUG score </= 18 sec    Time 4    Period Weeks    Status Achieved      PT SHORT TERM GOAL #3   Title understand correct posture to reduce strain on back and decrease pain    Time 4    Period Weeks    Status Achieved             PT Long Term Goals - 02/25/20 0941      PT LONG TERM GOAL #1   Title independent with HEP for postural  strength and balance    Time 8    Period Weeks    Status Achieved      PT LONG TERM GOAL #2   Title TUG score </= 13 seconds due to improved balance    Time 8    Period Weeks    Status Achieved      PT LONG TERM GOAL #3   Title perform daily tasks  with  >/= 50%  ease due to improved mobility and decreased lumbar pain    Time 8    Period Weeks    Status Achieved      PT LONG TERM GOAL #4   Title patient reports he walks with increased steadiness >/= 50% due to improve strength and posture    Baseline 20% better    Time 8    Period Weeks    Status Partially Met      PT LONG TERM GOAL #5   Title Dynamic gait test score >/= 21 due to increased balance    Baseline 21 points    Time 8    Period Weeks    Status Achieved                 Plan - 02/25/20 0939    Clinical Impression Statement Patient understands how to use a home TENS unit to manage his back pain. His Dynamic Gait index has improved to 21 points. Patient is able to do his tasks with greater ease but takes longer due to time management. Patient has increased strength. He still feel unsteady while walking but this may be due to his posture. Patient is being discharged to a HEP.    Personal Factors and Comorbidities Age;Fitness;Comorbidity 2;Time since onset of injury/illness/exacerbation    Comorbidities Diabetes; fractured lumbar spine 2006    Examination-Activity Limitations Locomotion Level;Stand;Sit;Reach Overhead    Examination-Participation Restrictions Community  Activity    Stability/Clinical Decision Making Evolving/Moderate complexity    Rehab Potential Good    PT Treatment/Interventions Cryotherapy;Dentist;Therapeutic activities;Therapeutic exercise;Balance training;Neuromuscular re-education;Manual techniques;Patient/family education;Joint Manipulations    PT Next Visit Plan discharge to HEP    PT Home Exercise Plan Access Code: [email or web address]     Recommended Other Services MD signed all notes    Consulted and Agree with Plan of Care Patient           Patient will benefit from skilled therapeutic intervention in order to improve the following deficits and impairments:  Abnormal gait, Decreased range of motion, Increased fascial restricitons, Pain, Decreased strength, Difficulty walking  Visit Diagnosis: Muscle weakness (generalized)  Chronic bilateral low back pain without sciatica  Other abnormalities of gait and mobility     Problem List Patient Active Problem List   Diagnosis Date Noted  . Barrett's esophagus 05/20/2013  . HTN (hypertension) 05/20/2013  . GENERALIZED OSTEOARTHROSIS UNSPECIFIED SITE 06/10/2008  . EPIGASTRIC PAIN 06/10/2008  . GASTRIC ULCER, HX OF 06/10/2008  . INTERNAL HEMORRHOIDS 06/08/2008    Earlie Counts, PT 02/25/20 10:13 AM   [facility] [address], [place], [place], [address] Phone: [phone]   Fax:  [phone]  Name: LONNELL CHAPUT MRN: [id number] Date of Birth: 03/05/1933 PHYSICAL THERAPY DISCHARGE SUMMARY  Visits from Start of Care: 13  Current functional level related to goals / functional outcomes: See above.    Remaining deficits: See above.    Education / Equipment: HEP Plan: Patient agrees to discharge.  Patient goals were partially met. Patient is being discharged due to                                                    maximizing his rehab potential at this time. Thank you for the referral. Earlie Counts, PT 02/25/20 10:14 AM   ?????

## 2020-04-22 ENCOUNTER — Other Ambulatory Visit (HOSPITAL_COMMUNITY): Payer: Self-pay | Admitting: *Deleted

## 2020-04-23 ENCOUNTER — Ambulatory Visit (HOSPITAL_COMMUNITY)
Admission: RE | Admit: 2020-04-23 | Discharge: 2020-04-23 | Disposition: A | Discharge status: Home / Self Care | Payer: Medicare Other | Source: Ambulatory Visit | Attending: Internal Medicine | Admitting: Internal Medicine

## 2020-04-23 ENCOUNTER — Other Ambulatory Visit: Payer: Self-pay

## 2020-04-23 DIAGNOSIS — M810 Age-related osteoporosis without current pathological fracture: Secondary | ICD-10-CM | POA: Insufficient documentation

## 2020-04-23 MED ORDER — DENOSUMAB 60 MG/ML ~~LOC~~ SOSY
PREFILLED_SYRINGE | SUBCUTANEOUS | Status: AC
  Filled 2020-04-23: qty 1

## 2020-04-23 MED ORDER — DENOSUMAB 60 MG/ML ~~LOC~~ SOSY
60.0000 mg | PREFILLED_SYRINGE | Freq: Once | SUBCUTANEOUS | Status: AC
  Administered 2020-04-23: 60 mg via SUBCUTANEOUS

## 2020-07-06 DIAGNOSIS — Z125 Encounter for screening for malignant neoplasm of prostate: Secondary | ICD-10-CM | POA: Diagnosis not present

## 2020-07-06 DIAGNOSIS — E785 Hyperlipidemia, unspecified: Secondary | ICD-10-CM | POA: Diagnosis not present

## 2020-07-06 DIAGNOSIS — R7301 Impaired fasting glucose: Secondary | ICD-10-CM | POA: Diagnosis not present

## 2020-07-06 DIAGNOSIS — E559 Vitamin D deficiency, unspecified: Secondary | ICD-10-CM | POA: Diagnosis not present

## 2020-07-12 DIAGNOSIS — F419 Anxiety disorder, unspecified: Secondary | ICD-10-CM | POA: Diagnosis not present

## 2020-07-12 DIAGNOSIS — R82998 Other abnormal findings in urine: Secondary | ICD-10-CM | POA: Diagnosis not present

## 2020-07-12 DIAGNOSIS — I872 Venous insufficiency (chronic) (peripheral): Secondary | ICD-10-CM | POA: Diagnosis not present

## 2020-07-12 DIAGNOSIS — M25561 Pain in right knee: Secondary | ICD-10-CM | POA: Diagnosis not present

## 2020-07-12 DIAGNOSIS — Z23 Encounter for immunization: Secondary | ICD-10-CM | POA: Diagnosis not present

## 2020-07-12 DIAGNOSIS — Z1212 Encounter for screening for malignant neoplasm of rectum: Secondary | ICD-10-CM | POA: Diagnosis not present

## 2020-07-12 DIAGNOSIS — Z0000 Encounter for general adult medical examination without abnormal findings: Secondary | ICD-10-CM | POA: Diagnosis not present

## 2020-07-12 DIAGNOSIS — N1831 Chronic kidney disease, stage 3a: Secondary | ICD-10-CM | POA: Diagnosis not present

## 2020-07-12 DIAGNOSIS — M549 Dorsalgia, unspecified: Secondary | ICD-10-CM | POA: Diagnosis not present

## 2020-07-12 DIAGNOSIS — Z1331 Encounter for screening for depression: Secondary | ICD-10-CM | POA: Diagnosis not present

## 2020-07-12 DIAGNOSIS — I129 Hypertensive chronic kidney disease with stage 1 through stage 4 chronic kidney disease, or unspecified chronic kidney disease: Secondary | ICD-10-CM | POA: Diagnosis not present

## 2020-07-12 DIAGNOSIS — E119 Type 2 diabetes mellitus without complications: Secondary | ICD-10-CM | POA: Diagnosis not present

## 2020-07-12 DIAGNOSIS — M810 Age-related osteoporosis without current pathological fracture: Secondary | ICD-10-CM | POA: Diagnosis not present

## 2020-07-12 DIAGNOSIS — R2681 Unsteadiness on feet: Secondary | ICD-10-CM | POA: Diagnosis not present

## 2020-07-12 DIAGNOSIS — G629 Polyneuropathy, unspecified: Secondary | ICD-10-CM | POA: Diagnosis not present

## 2020-07-12 DIAGNOSIS — K2270 Barrett's esophagus without dysplasia: Secondary | ICD-10-CM | POA: Diagnosis not present

## 2020-07-16 DIAGNOSIS — Z20822 Contact with and (suspected) exposure to covid-19: Secondary | ICD-10-CM | POA: Diagnosis not present

## 2020-08-18 DIAGNOSIS — N1831 Chronic kidney disease, stage 3a: Secondary | ICD-10-CM | POA: Diagnosis not present

## 2020-08-18 DIAGNOSIS — I129 Hypertensive chronic kidney disease with stage 1 through stage 4 chronic kidney disease, or unspecified chronic kidney disease: Secondary | ICD-10-CM | POA: Diagnosis not present

## 2020-08-18 DIAGNOSIS — Z79899 Other long term (current) drug therapy: Secondary | ICD-10-CM | POA: Diagnosis not present

## 2020-08-18 DIAGNOSIS — E119 Type 2 diabetes mellitus without complications: Secondary | ICD-10-CM | POA: Diagnosis not present

## 2020-09-27 ENCOUNTER — Ambulatory Visit: Payer: Medicare Other | Admitting: Family Medicine

## 2020-10-01 ENCOUNTER — Other Ambulatory Visit (HOSPITAL_COMMUNITY): Payer: Self-pay

## 2020-10-04 ENCOUNTER — Ambulatory Visit (HOSPITAL_COMMUNITY)
Admission: RE | Admit: 2020-10-04 | Discharge: 2020-10-04 | Disposition: A | Discharge status: Home / Self Care | Payer: Medicare Other | Source: Ambulatory Visit | Attending: Internal Medicine | Admitting: Internal Medicine

## 2020-10-04 ENCOUNTER — Other Ambulatory Visit: Payer: Self-pay

## 2020-10-04 DIAGNOSIS — M810 Age-related osteoporosis without current pathological fracture: Secondary | ICD-10-CM | POA: Diagnosis not present

## 2020-10-04 MED ORDER — DENOSUMAB 60 MG/ML ~~LOC~~ SOSY
60.0000 mg | PREFILLED_SYRINGE | Freq: Once | SUBCUTANEOUS | Status: AC
  Administered 2020-10-04: 60 mg via SUBCUTANEOUS

## 2020-10-04 MED ORDER — DENOSUMAB 60 MG/ML ~~LOC~~ SOSY
PREFILLED_SYRINGE | SUBCUTANEOUS | Status: AC
  Filled 2020-10-04: qty 1

## 2020-10-05 DIAGNOSIS — R3912 Poor urinary stream: Secondary | ICD-10-CM | POA: Diagnosis not present

## 2020-10-05 DIAGNOSIS — N401 Enlarged prostate with lower urinary tract symptoms: Secondary | ICD-10-CM | POA: Diagnosis not present

## 2020-10-05 DIAGNOSIS — R3911 Hesitancy of micturition: Secondary | ICD-10-CM | POA: Diagnosis not present

## 2020-11-08 ENCOUNTER — Other Ambulatory Visit: Payer: Self-pay

## 2020-11-09 ENCOUNTER — Encounter: Payer: Self-pay | Admitting: Family Medicine

## 2020-11-09 ENCOUNTER — Ambulatory Visit (INDEPENDENT_AMBULATORY_CARE_PROVIDER_SITE_OTHER): Payer: Medicare Other | Admitting: Family Medicine

## 2020-11-09 VITALS — BP 144/76 | HR 80 | Temp 97.3°F | Ht 63.0 in | Wt 160.8 lb

## 2020-11-09 DIAGNOSIS — N401 Enlarged prostate with lower urinary tract symptoms: Secondary | ICD-10-CM | POA: Diagnosis not present

## 2020-11-09 DIAGNOSIS — I1310 Hypertensive heart and chronic kidney disease without heart failure, with stage 1 through stage 4 chronic kidney disease, or unspecified chronic kidney disease: Secondary | ICD-10-CM | POA: Diagnosis not present

## 2020-11-09 DIAGNOSIS — M810 Age-related osteoporosis without current pathological fracture: Secondary | ICD-10-CM

## 2020-11-09 DIAGNOSIS — R6889 Other general symptoms and signs: Secondary | ICD-10-CM

## 2020-11-09 DIAGNOSIS — R3911 Hesitancy of micturition: Secondary | ICD-10-CM | POA: Diagnosis not present

## 2020-11-09 DIAGNOSIS — E559 Vitamin D deficiency, unspecified: Secondary | ICD-10-CM | POA: Diagnosis not present

## 2020-11-09 DIAGNOSIS — G629 Polyneuropathy, unspecified: Secondary | ICD-10-CM

## 2020-11-09 DIAGNOSIS — M549 Dorsalgia, unspecified: Secondary | ICD-10-CM

## 2020-11-09 DIAGNOSIS — E119 Type 2 diabetes mellitus without complications: Secondary | ICD-10-CM | POA: Diagnosis not present

## 2020-11-09 NOTE — Progress Notes (Signed)
New Patient Office Visit  Subjective:  Patient ID: Cameron Todd, male    DOB: 1933/01/02  Age: 87 y.o. MRN: [id number]  CC:  Chief Complaint  Patient presents with  . Establish Care    NP/establish care, concerns about knees feeling like they want to give out when walking.     HPI Cameron Todd presents for establishment of care with a history of hypertensive heart and renal disease, controlled type 2 diabetes, polyneuropathy.  Generalized osteoarthrosis and dorsalgia, age-related osteoporosis, BPH with urinary hesitancy, cold and tolerance vitamin D deficiency.  History of Barrett's esophagus.  He does not smoke or drink alcohol.  He lives with his wife who is also my patient.  He has been seeing a nephrologist yearly he tells me.  He tells me that he has been taking the Tylenol with codeine for 30 years.  Tamsulosin seems to help his urine flow.  Past Medical History:  Diagnosis Date  . Arthritis   . Barrett's esophagus   . Compression fracture of spine ([place])    T 10  . Diabetes ([place])   . Elevated cholesterol   . Gastric ulcer 1990  . Hemorrhoids   . History of colon polyps   . Hypertension   . Tubular adenoma of colon     Past Surgical History:  Procedure Laterality Date  . cataract surgery bilateral    . NO PAST SURGERIES    . UPPER GASTROINTESTINAL ENDOSCOPY      Family History  Problem Relation Age of Onset  . Diabetes Sister   . Colon cancer Neg Hx     Social History   Socioeconomic History  . Marital status: Married    Spouse name: Not on file  . Number of children: Not on file  . Years of education: Not on file  . Highest education level: Not on file  Occupational History  . Not on file  Tobacco Use  . Smoking status: Never Smoker  . Smokeless tobacco: Never Used  Vaping Use  . Vaping Use: Never used  Substance and Sexual Activity  . Alcohol use: No  . Drug use: No  . Sexual activity: Not on file  Other Topics Concern  . Not on file  Social  History Narrative  . Not on file   Social Determinants of Health   Financial Resource Strain: Not on file  Food Insecurity: Not on file  Transportation Needs: Not on file  Physical Activity: Not on file  Stress: Not on file  Social Connections: Not on file  Intimate Partner Violence: Not on file    ROS Review of Systems  Constitutional: Negative for diaphoresis, fatigue, fever and unexpected weight change.  HENT: Negative.   Eyes: Negative for photophobia and visual disturbance.  Respiratory: Negative for chest tightness and shortness of breath.   Cardiovascular: Negative for chest pain and palpitations.  Gastrointestinal: Negative.   Endocrine: Negative for polyphagia and polyuria.  Genitourinary: Positive for difficulty urinating and frequency.  Musculoskeletal: Positive for arthralgias, back pain, gait problem and myalgias.  Skin: Negative for pallor and rash.  Neurological: Negative for weakness.  Psychiatric/Behavioral: Negative.     Objective:   Today's Vitals: BP (!) 144/76   Pulse 80   Temp (!) 97.3 F (36.3 C) (Temporal)   Ht 5\' 3"  (1.6 m)   Wt 160 lb 12.8 oz (72.9 kg)   SpO2 97%   BMI 28.48 kg/m   Physical Exam Vitals and nursing note reviewed.  Constitutional:      General: He is not in acute distress.    Appearance: Normal appearance. He is not toxic-appearing or diaphoretic.  HENT:     Head: Normocephalic and atraumatic.     Right Ear: Tympanic membrane, ear canal and external ear normal.     Left Ear: Tympanic membrane, ear canal and external ear normal.     Mouth/Throat:     Mouth: Mucous membranes are moist.     Pharynx: Oropharynx is clear. No oropharyngeal exudate or posterior oropharyngeal erythema.  Eyes:     General: No scleral icterus.       Right eye: No discharge.        Left eye: No discharge.     Extraocular Movements: Extraocular movements intact.     Conjunctiva/sclera: Conjunctivae normal.     Pupils: Pupils are equal, round,  and reactive to light.  Cardiovascular:     Rate and Rhythm: Normal rate and regular rhythm.  Pulmonary:     Effort: Pulmonary effort is normal.     Breath sounds: Normal breath sounds.  Abdominal:     General: Bowel sounds are normal.  Musculoskeletal:     Right shoulder: Normal range of motion. Normal strength.     Left shoulder: Normal range of motion. Normal strength.     Right elbow: Normal range of motion.     Left elbow: Normal range of motion.     Cervical back: No rigidity or tenderness. Decreased range of motion.  Lymphadenopathy:     Cervical: No cervical adenopathy.  Skin:    General: Skin is warm and dry.  Neurological:     Mental Status: He is alert and oriented to person, place, and time.     Motor: No weakness.     Coordination: Romberg sign negative.  Psychiatric:        Mood and Affect: Mood normal.        Behavior: Behavior normal.     Assessment & Plan:   Problem List Items Addressed This Visit      Cardiovascular and Mediastinum   Hypertensive heart and renal disease - Primary   Relevant Medications   losartan (COZAAR) 25 MG tablet   Other Relevant Orders   CBC   Comprehensive metabolic panel   LDL cholesterol, direct   Lipid panel   Urinalysis, Routine w reflex microscopic   Microalbumin / creatinine urine ratio     Endocrine   Controlled type 2 diabetes mellitus without complication, without long-term current use of insulin (HCC)   Relevant Medications   losartan (COZAAR) 25 MG tablet   Other Relevant Orders   Comprehensive metabolic panel   Hemoglobin A1c   Urinalysis, Routine w reflex microscopic   Microalbumin / creatinine urine ratio     Nervous and Auditory   Polyneuropathy     Musculoskeletal and Integument   Age related osteoporosis     Genitourinary   Benign prostatic hyperplasia with urinary hesitancy     Other   Cold intolerance   Relevant Orders   TSH   Vitamin D deficiency   Relevant Orders   VITAMIN D 25 Hydroxy  (Vit-D Deficiency, Fractures)   Dorsalgia      Outpatient Encounter Medications as of 11/09/2020  Medication Sig  . acetaminophen-codeine (TYLENOL #4) 300-60 MG per tablet Take 1-2 tablets by mouth daily.   Marland Kitchen CALCIUM CITRATE PO Take 500 mg by mouth 2 (two) times daily.  . CRESTOR 10 MG tablet Take 10  mg by mouth daily.   Marland Kitchen denosumab (PROLIA) 60 MG/ML SOLN injection Inject 60 mg into the skin every 6 (six) months. Administer in upper arm, thigh, or abdomen  . gabapentin (NEURONTIN) 300 MG capsule Take 300 mg by mouth 2 (two) times daily.   . magnesium oxide (MAG-OX) 400 MG tablet Take 400 mg by mouth daily.  . methocarbamol (ROBAXIN) 500 MG tablet Take 500 mg by mouth 2 (two) times daily.   . mirabegron ER (MYRBETRIQ) 50 MG TB24 tablet Take 50 mg by mouth daily.  . Multiple Vitamin (MULTIVITAMIN) tablet Take 1 tablet by mouth daily.  Marland Kitchen omeprazole (PRILOSEC) 40 MG capsule Take 1 capsule (40 mg total) by mouth 2 (two) times daily.  . pioglitazone (ACTOS) 30 MG tablet Take 30 mg by mouth daily.   . tamsulosin (FLOMAX) 0.4 MG CAPS capsule Take 0.4 mg by mouth.  . Vitamin D, Ergocalciferol, (DRISDOL) 50000 UNITS CAPS capsule Take 50,000 Units by mouth every 7 (seven) days.  . hydrocortisone (ANUSOL-HC) 25 MG suppository Place 1 suppository (25 mg total) rectally at bedtime as needed for hemorrhoids or itching. (Patient not taking: No sig reported)  . losartan (COZAAR) 25 MG tablet Take 25 mg by mouth daily.  . [DISCONTINUED] amLODipine (NORVASC) 2.5 MG tablet Take 2.5 mg by mouth daily. (Patient not taking: Reported on 11/09/2020)  . [DISCONTINUED] ipratropium (ATROVENT) 0.03 % nasal spray Place 2 sprays into both nostrils as needed.  (Patient not taking: Reported on 11/09/2020)  . [DISCONTINUED] pioglitazone (ACTOS) 30 MG tablet Take 1 tablet by mouth daily.   No facility-administered encounter medications on file as of 11/09/2020.    Follow-up: Return in about 3 months (around 02/08/2021), or  Return fasting please for today's ordered blood work.Libby Maw, MD

## 2020-11-17 ENCOUNTER — Other Ambulatory Visit: Payer: Self-pay

## 2020-11-17 ENCOUNTER — Other Ambulatory Visit (INDEPENDENT_AMBULATORY_CARE_PROVIDER_SITE_OTHER): Payer: Medicare Other

## 2020-11-17 DIAGNOSIS — R6889 Other general symptoms and signs: Secondary | ICD-10-CM

## 2020-11-17 DIAGNOSIS — I1310 Hypertensive heart and chronic kidney disease without heart failure, with stage 1 through stage 4 chronic kidney disease, or unspecified chronic kidney disease: Secondary | ICD-10-CM

## 2020-11-17 DIAGNOSIS — E119 Type 2 diabetes mellitus without complications: Secondary | ICD-10-CM

## 2020-11-17 DIAGNOSIS — E559 Vitamin D deficiency, unspecified: Secondary | ICD-10-CM

## 2020-11-17 LAB — LIPID PANEL
Cholesterol: 140 mg/dL (ref 0–200)
HDL: 37.8 mg/dL — ABNORMAL LOW (ref >39.00)
NonHDL: 102.19
Total CHOL/HDL Ratio: 4
Triglycerides: 254 mg/dL — ABNORMAL HIGH (ref 0.0–149.0)
VLDL: 50.8 mg/dL — ABNORMAL HIGH (ref 0.0–40.0)

## 2020-11-17 LAB — URINALYSIS, ROUTINE W REFLEX MICROSCOPIC
Bilirubin Urine: NEGATIVE
Hgb urine dipstick: NEGATIVE
Ketones, ur: NEGATIVE
Leukocytes,Ua: NEGATIVE
Nitrite: NEGATIVE
RBC / HPF: NONE SEEN (ref 0–?)
Specific Gravity, Urine: 1.01 (ref 1.000–1.030)
Total Protein, Urine: NEGATIVE
Urine Glucose: NEGATIVE
Urobilinogen, UA: 0.2 (ref 0.0–1.0)
pH: 7 (ref 5.0–8.0)

## 2020-11-17 LAB — HEMOGLOBIN A1C: Hgb A1c MFr Bld: 6.6 % — ABNORMAL HIGH (ref 4.6–6.5)

## 2020-11-17 LAB — VITAMIN D 25 HYDROXY (VIT D DEFICIENCY, FRACTURES): VITD: 69.14 ng/mL (ref 30.00–100.00)

## 2020-11-17 LAB — COMPREHENSIVE METABOLIC PANEL
ALT: 17 U/L (ref 0–53)
AST: 19 U/L (ref 0–37)
Albumin: 4.3 g/dL (ref 3.5–5.2)
Alkaline Phosphatase: 41 U/L (ref 39–117)
BUN: 22 mg/dL (ref 6–23)
CO2: 26 mEq/L (ref 19–32)
Calcium: 8.9 mg/dL (ref 8.4–10.5)
Chloride: 102 mEq/L (ref 96–112)
Creatinine, Ser: 1.49 mg/dL (ref 0.40–1.50)
GFR: 42.52 mL/min — ABNORMAL LOW (ref >60.00)
Glucose, Bld: 123 mg/dL — ABNORMAL HIGH (ref 70–99)
Potassium: 4.2 mEq/L (ref 3.5–5.1)
Sodium: 137 mEq/L (ref 135–145)
Total Bilirubin: 0.5 mg/dL (ref 0.2–1.2)
Total Protein: 6.9 g/dL (ref 6.0–8.3)

## 2020-11-17 LAB — CBC
HCT: 36 % — ABNORMAL LOW (ref 39.0–52.0)
Hemoglobin: 12.2 g/dL — ABNORMAL LOW (ref 13.0–17.0)
MCHC: 33.9 g/dL (ref 30.0–36.0)
MCV: 95.2 fl (ref 78.0–100.0)
Platelets: 180 10*3/uL (ref 150.0–400.0)
RBC: 3.78 Mil/uL — ABNORMAL LOW (ref 4.22–5.81)
RDW: 13.5 % (ref 11.5–15.5)
WBC: 6.6 10*3/uL (ref 4.0–10.5)

## 2020-11-17 LAB — TSH: TSH: 4.21 u[IU]/mL (ref 0.35–4.50)

## 2020-11-17 LAB — MICROALBUMIN / CREATININE URINE RATIO
Creatinine,U: 133.6 mg/dL
Microalb Creat Ratio: 0.7 mg/g (ref 0.0–30.0)
Microalb, Ur: 0.9 mg/dL (ref 0.0–1.9)

## 2020-11-17 LAB — LDL CHOLESTEROL, DIRECT: Direct LDL: 76 mg/dL

## 2020-11-17 NOTE — Progress Notes (Signed)
Per orders of Dr. Ethelene Hal pt is here for lab draw, pt tolerated draw well.

## 2021-02-08 ENCOUNTER — Other Ambulatory Visit: Payer: Self-pay

## 2021-02-08 ENCOUNTER — Encounter: Payer: Self-pay | Admitting: Family Medicine

## 2021-02-08 ENCOUNTER — Ambulatory Visit (INDEPENDENT_AMBULATORY_CARE_PROVIDER_SITE_OTHER): Payer: Medicare Other | Admitting: Family Medicine

## 2021-02-08 VITALS — BP 115/66 | HR 66 | Temp 98.9°F | Ht 63.0 in | Wt 158.8 lb

## 2021-02-08 DIAGNOSIS — M810 Age-related osteoporosis without current pathological fracture: Secondary | ICD-10-CM

## 2021-02-08 DIAGNOSIS — I10 Essential (primary) hypertension: Secondary | ICD-10-CM

## 2021-02-08 DIAGNOSIS — E119 Type 2 diabetes mellitus without complications: Secondary | ICD-10-CM

## 2021-02-08 DIAGNOSIS — I1310 Hypertensive heart and chronic kidney disease without heart failure, with stage 1 through stage 4 chronic kidney disease, or unspecified chronic kidney disease: Secondary | ICD-10-CM

## 2021-02-08 NOTE — Progress Notes (Signed)
Established Patient Office Visit  Subjective:  Patient ID: Cameron Todd, male    DOB: [birth year]  Age: 87 y.o. MRN: [id number]  CC:  Chief Complaint  Patient presents with   Follow-up    3 month follow up, no concerns.     HPI ABB GOBERT presents for follow-up of hypertension, CKD, osteoporosis and diabetes.  Diabetes is controlled well with pioglitazone and last hemoglobin A1c was 6.6.  Blood pressure well controlled with losartan and metoprolol.  GFR 3 months ago was 42.  No recent diabetic eye exam.  History of age-related osteoporosis status post compression fracture of T10.  Currently on Prolia every 6 months and 5000 international units of vitamin D daily.  History of BPH and is currently followed by urology.  Past Medical History:  Diagnosis Date   Arthritis    Barrett's esophagus    Compression fracture of spine (HCC)    T 10   Diabetes (HCC)    Elevated cholesterol    Gastric ulcer 1990   Hemorrhoids    History of colon polyps    Hypertension    Tubular adenoma of colon     Past Surgical History:  Procedure Laterality Date   cataract surgery bilateral     NO PAST SURGERIES     UPPER GASTROINTESTINAL ENDOSCOPY      Family History  Problem Relation Age of Onset   Diabetes Sister    Colon cancer Neg Hx     Social History   Socioeconomic History   Marital status: Married    Spouse name: Not on file   Number of children: Not on file   Years of education: Not on file   Highest education level: Not on file  Occupational History   Not on file  Tobacco Use   Smoking status: Never   Smokeless tobacco: Never  Vaping Use   Vaping Use: Never used  Substance and Sexual Activity   Alcohol use: No   Drug use: No   Sexual activity: Not on file  Other Topics Concern   Not on file  Social History Narrative   Not on file   Social Determinants of Health   Financial Resource Strain: Not on file  Food Insecurity: Not on file  Transportation Needs: Not  on file  Physical Activity: Not on file  Stress: Not on file  Social Connections: Not on file  Intimate Partner Violence: Not on file    Outpatient Medications Prior to Visit  Medication Sig Dispense Refill   acetaminophen-codeine (TYLENOL #4) 300-60 MG per tablet Take 1-2 tablets by mouth daily.      CALCIUM CITRATE PO Take 500 mg by mouth 2 (two) times daily.     CRESTOR 10 MG tablet Take 10 mg by mouth daily.      denosumab (PROLIA) 60 MG/ML SOLN injection Inject 60 mg into the skin every 6 (six) months. Administer in upper arm, thigh, or abdomen     gabapentin (NEURONTIN) 300 MG capsule Take 300 mg by mouth 2 (two) times daily.      losartan (COZAAR) 25 MG tablet Take 25 mg by mouth daily.     magnesium oxide (MAG-OX) 400 MG tablet Take 400 mg by mouth daily.     methocarbamol (ROBAXIN) 500 MG tablet Take 500 mg by mouth 2 (two) times daily.      mirabegron ER (MYRBETRIQ) 50 MG TB24 tablet Take 50 mg by mouth daily.     Multiple Vitamin (MULTIVITAMIN)  tablet Take 1 tablet by mouth daily.     omeprazole (PRILOSEC) 40 MG capsule Take 1 capsule (40 mg total) by mouth 2 (two) times daily. 180 capsule 1   pioglitazone (ACTOS) 30 MG tablet Take 30 mg by mouth daily.      tamsulosin (FLOMAX) 0.4 MG CAPS capsule Take 0.4 mg by mouth.     Vitamin D, Ergocalciferol, (DRISDOL) 50000 UNITS CAPS capsule Take 50,000 Units by mouth every 7 (seven) days.     metoprolol succinate (TOPROL-XL) 25 MG 24 hr tablet Take 25 mg by mouth daily.     hydrocortisone (ANUSOL-HC) 25 MG suppository Place 1 suppository (25 mg total) rectally at bedtime as needed for hemorrhoids or itching. (Patient not taking: No sig reported) 12 suppository 2   No facility-administered medications prior to visit.    No Known Allergies  ROS Review of Systems  Constitutional: Negative.   HENT: Negative.    Respiratory: Negative.    Cardiovascular: Negative.   Genitourinary: Negative.   Musculoskeletal:  Positive for  arthralgias.  Neurological:  Negative for speech difficulty.  Psychiatric/Behavioral: Negative.       Objective:    Physical Exam Vitals and nursing note reviewed.  Constitutional:      General: He is not in acute distress.    Appearance: Normal appearance. He is not ill-appearing, toxic-appearing or diaphoretic.  HENT:     Head: Normocephalic and atraumatic.     Right Ear: Tympanic membrane, ear canal and external ear normal.     Left Ear: Tympanic membrane, ear canal and external ear normal.  Eyes:     General: No scleral icterus.       Right eye: No discharge.        Left eye: No discharge.     Extraocular Movements: Extraocular movements intact.     Conjunctiva/sclera: Conjunctivae normal.     Pupils: Pupils are equal, round, and reactive to light.  Neck:     Vascular: No carotid bruit.  Cardiovascular:     Rate and Rhythm: Normal rate and regular rhythm.  Pulmonary:     Effort: Pulmonary effort is normal.     Breath sounds: Normal breath sounds.  Abdominal:     General: Bowel sounds are normal.  Musculoskeletal:     Cervical back: No rigidity or tenderness.  Lymphadenopathy:     Cervical: No cervical adenopathy.  Skin:    General: Skin is warm and dry.  Neurological:     Mental Status: He is alert and oriented to person, place, and time.  Psychiatric:        Mood and Affect: Mood normal.        Behavior: Behavior normal.    BP 115/66   Pulse 66   Temp 98.9 F (37.2 C) (Temporal)   Ht 5\' 3"  (1.6 m)   Wt 158 lb 12.8 oz (72 kg)   SpO2 97%   BMI 28.13 kg/m  Wt Readings from Last 3 Encounters:  02/08/21 158 lb 12.8 oz (72 kg)  11/09/20 160 lb 12.8 oz (72.9 kg)  05/16/18 161 lb (73 kg)     Health Maintenance Due  Topic Date Due   FOOT EXAM  Never done   OPHTHALMOLOGY EXAM  Never done   Zoster Vaccines- Shingrix (1 of 2) Never done   PNA vac Low Risk Adult (2 of 2 - PCV13) 05/07/2014   COLONOSCOPY (Pts 45-10yrs Insurance coverage will need to be  confirmed)  06/04/2018   TETANUS/TDAP  03/30/2020    There are no preventive care reminders to display for this patient.  Lab Results  Component Value Date   TSH 4.21 11/17/2020   Lab Results  Component Value Date   WBC 6.6 11/17/2020   HGB 12.2 (L) 11/17/2020   HCT 36.0 (L) 11/17/2020   MCV 95.2 11/17/2020   PLT 180.0 11/17/2020   Lab Results  Component Value Date   NA 137 11/17/2020   K 4.2 11/17/2020   CO2 26 11/17/2020   GLUCOSE 123 (H) 11/17/2020   BUN 22 11/17/2020   CREATININE 1.49 11/17/2020   BILITOT 0.5 11/17/2020   ALKPHOS 41 11/17/2020   AST 19 11/17/2020   ALT 17 11/17/2020   PROT 6.9 11/17/2020   ALBUMIN 4.3 11/17/2020   CALCIUM 8.9 11/17/2020   GFR 42.52 (L) 11/17/2020   Lab Results  Component Value Date   CHOL 140 11/17/2020   Lab Results  Component Value Date   HDL 37.80 (L) 11/17/2020   No results found for: [facility] Lab Results  Component Value Date   TRIG 254.0 (H) 11/17/2020   Lab Results  Component Value Date   CHOLHDL 4 11/17/2020   Lab Results  Component Value Date   HGBA1C 6.6 (H) 11/17/2020      Assessment & Plan:   Problem List Items Addressed This Visit       Cardiovascular and Mediastinum   HTN (hypertension)   Relevant Medications   metoprolol succinate (TOPROL-XL) 25 MG 24 hr tablet   Hypertensive heart and renal disease   Relevant Medications   metoprolol succinate (TOPROL-XL) 25 MG 24 hr tablet   Other Relevant Orders   Basic metabolic panel   Ambulatory referral to Ophthalmology     Endocrine   Controlled type 2 diabetes mellitus without complication, without long-term current use of insulin ([place])     Musculoskeletal and Integument   Age related osteoporosis - Primary   Relevant Orders   DG Bone Density    No orders of the defined types were placed in this encounter.   Follow-up: Return in about 3 months (around 05/11/2021).   Continue Prolia and vitamin D supplementation.  Vitamin D levels are  pending.  Continue metoprolol and losartan for blood pressure and history of CHF.  Diabetic eye check ordered.  Recheck bone density with history of osteoporosis and osteoporotic fracture. Libby Maw, MD

## 2021-02-09 LAB — BASIC METABOLIC PANEL
BUN: 22 mg/dL (ref 6–23)
CO2: 28 mEq/L (ref 19–32)
Calcium: 9.6 mg/dL (ref 8.4–10.5)
Chloride: 102 mEq/L (ref 96–112)
Creatinine, Ser: 1.45 mg/dL (ref 0.40–1.50)
GFR: 43.86 mL/min — ABNORMAL LOW (ref >60.00)
Glucose, Bld: 100 mg/dL — ABNORMAL HIGH (ref 70–99)
Potassium: 4.9 mEq/L (ref 3.5–5.1)
Sodium: 137 mEq/L (ref 135–145)

## 2021-02-22 ENCOUNTER — Telehealth: Payer: Self-pay | Admitting: Family Medicine

## 2021-02-22 NOTE — Telephone Encounter (Signed)
Pt called and requested call back to discuss lab results. Please advise. [phone]

## 2021-02-23 NOTE — Telephone Encounter (Signed)
Patient aware of labs. No concerns.

## 2021-04-01 ENCOUNTER — Telehealth: Payer: Self-pay

## 2021-04-01 NOTE — Telephone Encounter (Signed)
Lft VM to rtn call to schedule an AWV. Dm/cma

## 2021-05-11 ENCOUNTER — Other Ambulatory Visit (HOSPITAL_COMMUNITY): Payer: Self-pay | Admitting: *Deleted

## 2021-05-12 ENCOUNTER — Other Ambulatory Visit: Payer: Self-pay

## 2021-05-12 ENCOUNTER — Ambulatory Visit (HOSPITAL_COMMUNITY)
Admission: RE | Admit: 2021-05-12 | Discharge: 2021-05-12 | Disposition: A | Discharge status: Home / Self Care | Payer: Medicare Other | Source: Ambulatory Visit | Attending: Internal Medicine | Admitting: Internal Medicine

## 2021-05-12 DIAGNOSIS — M810 Age-related osteoporosis without current pathological fracture: Secondary | ICD-10-CM | POA: Diagnosis not present

## 2021-05-12 MED ORDER — DENOSUMAB 60 MG/ML ~~LOC~~ SOSY
PREFILLED_SYRINGE | SUBCUTANEOUS | Status: AC
  Filled 2021-05-12: qty 1

## 2021-05-12 MED ORDER — DENOSUMAB 60 MG/ML ~~LOC~~ SOSY
60.0000 mg | PREFILLED_SYRINGE | Freq: Once | SUBCUTANEOUS | Status: AC
  Administered 2021-05-12: 60 mg via SUBCUTANEOUS

## 2021-05-13 ENCOUNTER — Ambulatory Visit: Payer: Medicare Other | Admitting: Family Medicine

## 2021-06-27 ENCOUNTER — Telehealth: Payer: Self-pay | Admitting: Family Medicine

## 2021-06-27 NOTE — Telephone Encounter (Signed)
Left message for patient to call back to schedule Medicare Annual Wellness Visit   No hx of AWV eligible as of 06/23/21  Please schedule at anytime with [facility]urse Health Advisor if patient calls the office back.    42 Minutes appointment   Any questions, please call me at [phone]

## 2021-08-09 DIAGNOSIS — E785 Hyperlipidemia, unspecified: Secondary | ICD-10-CM | POA: Diagnosis not present

## 2021-08-09 DIAGNOSIS — I10 Essential (primary) hypertension: Secondary | ICD-10-CM | POA: Diagnosis not present

## 2021-08-09 DIAGNOSIS — E119 Type 2 diabetes mellitus without complications: Secondary | ICD-10-CM | POA: Diagnosis not present

## 2021-08-09 DIAGNOSIS — E559 Vitamin D deficiency, unspecified: Secondary | ICD-10-CM | POA: Diagnosis not present

## 2021-08-16 DIAGNOSIS — N1831 Chronic kidney disease, stage 3a: Secondary | ICD-10-CM | POA: Diagnosis not present

## 2021-08-16 DIAGNOSIS — R82998 Other abnormal findings in urine: Secondary | ICD-10-CM | POA: Diagnosis not present

## 2021-08-16 DIAGNOSIS — M810 Age-related osteoporosis without current pathological fracture: Secondary | ICD-10-CM | POA: Diagnosis not present

## 2021-08-16 DIAGNOSIS — I10 Essential (primary) hypertension: Secondary | ICD-10-CM | POA: Diagnosis not present

## 2021-08-16 DIAGNOSIS — K2270 Barrett's esophagus without dysplasia: Secondary | ICD-10-CM | POA: Diagnosis not present

## 2021-08-16 DIAGNOSIS — E119 Type 2 diabetes mellitus without complications: Secondary | ICD-10-CM | POA: Diagnosis not present

## 2021-08-16 DIAGNOSIS — H532 Diplopia: Secondary | ICD-10-CM | POA: Diagnosis not present

## 2021-08-16 DIAGNOSIS — I872 Venous insufficiency (chronic) (peripheral): Secondary | ICD-10-CM | POA: Diagnosis not present

## 2021-08-16 DIAGNOSIS — E785 Hyperlipidemia, unspecified: Secondary | ICD-10-CM | POA: Diagnosis not present

## 2021-08-16 DIAGNOSIS — Z23 Encounter for immunization: Secondary | ICD-10-CM | POA: Diagnosis not present

## 2021-08-16 DIAGNOSIS — F419 Anxiety disorder, unspecified: Secondary | ICD-10-CM | POA: Diagnosis not present

## 2021-08-16 DIAGNOSIS — G629 Polyneuropathy, unspecified: Secondary | ICD-10-CM | POA: Diagnosis not present

## 2021-08-16 DIAGNOSIS — Z0000 Encounter for general adult medical examination without abnormal findings: Secondary | ICD-10-CM | POA: Diagnosis not present

## 2021-08-22 ENCOUNTER — Telehealth: Payer: Self-pay | Admitting: Family Medicine

## 2021-08-22 NOTE — Telephone Encounter (Signed)
Left message for patient to call back and schedule Medicare Annual Wellness Visit (AWV) in office.   If not able to come in office, please offer to do virtually or by telephone.  Left office number and my jabber [phone].  Last AWV:06/23/2021  Please schedule at anytime with Nurse Health Advisor.

## 2021-10-10 DIAGNOSIS — R3915 Urgency of urination: Secondary | ICD-10-CM | POA: Diagnosis not present

## 2021-10-10 DIAGNOSIS — R3912 Poor urinary stream: Secondary | ICD-10-CM | POA: Diagnosis not present

## 2021-10-10 DIAGNOSIS — R3911 Hesitancy of micturition: Secondary | ICD-10-CM | POA: Diagnosis not present

## 2021-10-10 DIAGNOSIS — N401 Enlarged prostate with lower urinary tract symptoms: Secondary | ICD-10-CM | POA: Diagnosis not present

## 2021-11-16 ENCOUNTER — Other Ambulatory Visit (HOSPITAL_COMMUNITY): Payer: Self-pay | Admitting: *Deleted

## 2021-11-17 ENCOUNTER — Ambulatory Visit (HOSPITAL_COMMUNITY)
Admission: RE | Admit: 2021-11-17 | Discharge: 2021-11-17 | Disposition: A | Discharge status: Home / Self Care | Payer: Medicare Other | Source: Ambulatory Visit | Attending: Internal Medicine | Admitting: Internal Medicine

## 2021-11-17 DIAGNOSIS — M810 Age-related osteoporosis without current pathological fracture: Secondary | ICD-10-CM | POA: Diagnosis not present

## 2021-11-17 MED ORDER — DENOSUMAB 60 MG/ML ~~LOC~~ SOSY
60.0000 mg | PREFILLED_SYRINGE | Freq: Once | SUBCUTANEOUS | Status: AC
  Administered 2021-11-17: 60 mg via SUBCUTANEOUS

## 2021-11-17 MED ORDER — DENOSUMAB 60 MG/ML ~~LOC~~ SOSY
PREFILLED_SYRINGE | SUBCUTANEOUS | Status: AC
  Filled 2021-11-17: qty 1

## 2022-02-09 ENCOUNTER — Other Ambulatory Visit: Payer: Self-pay

## 2022-02-09 ENCOUNTER — Telehealth: Payer: Self-pay | Admitting: Pharmacy Technician

## 2022-02-09 NOTE — Telephone Encounter (Signed)
Auth Submission: NO AUTH NEEDED Payer: MEDICARE A/B & [employer] Medication & CPT/J Code(s) submitted: Prolia (Denosumab) [id number] Route of submission (phone, fax, portal): PHONE: [phone] Auth type: Buy/Bill Units/visits requested: X1 DOSE Reference number: [id number] Approval from: 02/09/22 to 05/12/22

## 2022-02-21 ENCOUNTER — Ambulatory Visit (INDEPENDENT_AMBULATORY_CARE_PROVIDER_SITE_OTHER): Payer: Medicare Other

## 2022-02-21 VITALS — BP 132/68 | HR 69 | Temp 98.4°F | Resp 16 | Ht 62.0 in | Wt 157.6 lb

## 2022-02-21 DIAGNOSIS — M810 Age-related osteoporosis without current pathological fracture: Secondary | ICD-10-CM

## 2022-02-21 MED ORDER — DENOSUMAB 60 MG/ML ~~LOC~~ SOSY
60.0000 mg | PREFILLED_SYRINGE | Freq: Once | SUBCUTANEOUS | Status: AC
  Administered 2022-02-21: 60 mg via SUBCUTANEOUS

## 2022-02-21 NOTE — Progress Notes (Signed)
Diagnosis: Osteoporosis  Provider:  Praveen Mannam, MD  Procedure: Injection  Prolia (Denosumab), Dose: 60 mg, Site: subcutaneous, Number of injections: 1  Discharge: Condition: Good, Destination: Home . AVS provided to patient.   Performed by:  Oslo Huntsman E Konnar Ben, LPN       

## 2022-07-12 DIAGNOSIS — M7918 Myalgia, other site: Secondary | ICD-10-CM | POA: Diagnosis not present

## 2022-07-12 DIAGNOSIS — M5417 Radiculopathy, lumbosacral region: Secondary | ICD-10-CM | POA: Diagnosis not present

## 2022-07-12 DIAGNOSIS — Z23 Encounter for immunization: Secondary | ICD-10-CM | POA: Diagnosis not present

## 2022-07-12 DIAGNOSIS — E119 Type 2 diabetes mellitus without complications: Secondary | ICD-10-CM | POA: Diagnosis not present

## 2022-07-28 ENCOUNTER — Other Ambulatory Visit: Payer: Self-pay | Admitting: Internal Medicine

## 2022-07-28 DIAGNOSIS — M5417 Radiculopathy, lumbosacral region: Secondary | ICD-10-CM

## 2022-08-25 ENCOUNTER — Ambulatory Visit (INDEPENDENT_AMBULATORY_CARE_PROVIDER_SITE_OTHER): Payer: Medicare Other

## 2022-08-25 VITALS — BP 145/68 | HR 79 | Temp 97.5°F | Resp 20 | Ht 62.0 in | Wt 161.4 lb

## 2022-08-25 DIAGNOSIS — M810 Age-related osteoporosis without current pathological fracture: Secondary | ICD-10-CM | POA: Diagnosis not present

## 2022-08-25 MED ORDER — DENOSUMAB 60 MG/ML ~~LOC~~ SOSY
60.0000 mg | PREFILLED_SYRINGE | Freq: Once | SUBCUTANEOUS | Status: AC
  Administered 2022-08-25: 60 mg via SUBCUTANEOUS
  Filled 2022-08-25: qty 1

## 2022-08-25 NOTE — Progress Notes (Signed)
Diagnosis: Osteoporosis  Provider:  Marshell Garfinkel MD  Procedure: Injection  Prolia (Denosumab), Dose: 60 mg, Site: subcutaneous, Number of injections: 1  Post Care:  n/a  Discharge: Condition: Good, Destination: Home . AVS provided to patient.   Performed by:  Cleophus Molt, RN

## 2022-09-26 DIAGNOSIS — E119 Type 2 diabetes mellitus without complications: Secondary | ICD-10-CM | POA: Diagnosis not present

## 2022-09-26 DIAGNOSIS — F419 Anxiety disorder, unspecified: Secondary | ICD-10-CM | POA: Diagnosis not present

## 2022-09-26 DIAGNOSIS — E559 Vitamin D deficiency, unspecified: Secondary | ICD-10-CM | POA: Diagnosis not present

## 2022-09-26 DIAGNOSIS — E785 Hyperlipidemia, unspecified: Secondary | ICD-10-CM | POA: Diagnosis not present

## 2022-09-26 DIAGNOSIS — R7989 Other specified abnormal findings of blood chemistry: Secondary | ICD-10-CM | POA: Diagnosis not present

## 2022-09-26 DIAGNOSIS — Z125 Encounter for screening for malignant neoplasm of prostate: Secondary | ICD-10-CM | POA: Diagnosis not present

## 2022-10-03 DIAGNOSIS — G4700 Insomnia, unspecified: Secondary | ICD-10-CM | POA: Diagnosis not present

## 2022-10-03 DIAGNOSIS — M810 Age-related osteoporosis without current pathological fracture: Secondary | ICD-10-CM | POA: Diagnosis not present

## 2022-10-03 DIAGNOSIS — E119 Type 2 diabetes mellitus without complications: Secondary | ICD-10-CM | POA: Diagnosis not present

## 2022-10-03 DIAGNOSIS — Z1331 Encounter for screening for depression: Secondary | ICD-10-CM | POA: Diagnosis not present

## 2022-10-03 DIAGNOSIS — Z0000 Encounter for general adult medical examination without abnormal findings: Secondary | ICD-10-CM | POA: Diagnosis not present

## 2022-10-03 DIAGNOSIS — L89302 Pressure ulcer of unspecified buttock, stage 2: Secondary | ICD-10-CM | POA: Diagnosis not present

## 2022-10-03 DIAGNOSIS — I10 Essential (primary) hypertension: Secondary | ICD-10-CM | POA: Diagnosis not present

## 2022-10-03 DIAGNOSIS — G629 Polyneuropathy, unspecified: Secondary | ICD-10-CM | POA: Diagnosis not present

## 2022-10-03 DIAGNOSIS — K2270 Barrett's esophagus without dysplasia: Secondary | ICD-10-CM | POA: Diagnosis not present

## 2022-10-03 DIAGNOSIS — G2581 Restless legs syndrome: Secondary | ICD-10-CM | POA: Diagnosis not present

## 2022-10-03 DIAGNOSIS — M25552 Pain in left hip: Secondary | ICD-10-CM | POA: Diagnosis not present

## 2022-10-03 DIAGNOSIS — E785 Hyperlipidemia, unspecified: Secondary | ICD-10-CM | POA: Diagnosis not present

## 2022-10-03 DIAGNOSIS — R82998 Other abnormal findings in urine: Secondary | ICD-10-CM | POA: Diagnosis not present

## 2022-10-17 ENCOUNTER — Inpatient Hospital Stay: Admission: RE | Admit: 2022-10-17 | Payer: Medicare Other | Source: Ambulatory Visit

## 2022-10-19 ENCOUNTER — Encounter: Payer: Self-pay | Admitting: Pulmonary Disease

## 2022-10-19 ENCOUNTER — Ambulatory Visit
Admission: RE | Admit: 2022-10-19 | Discharge: 2022-10-19 | Disposition: A | Discharge status: Home / Self Care | Payer: Medicare Other | Source: Ambulatory Visit | Attending: Internal Medicine | Admitting: Internal Medicine

## 2022-10-19 DIAGNOSIS — M545 Low back pain, unspecified: Secondary | ICD-10-CM | POA: Diagnosis not present

## 2022-10-19 DIAGNOSIS — M5417 Radiculopathy, lumbosacral region: Secondary | ICD-10-CM

## 2022-11-01 DIAGNOSIS — L98429 Non-pressure chronic ulcer of back with unspecified severity: Secondary | ICD-10-CM | POA: Diagnosis not present

## 2022-11-01 DIAGNOSIS — M5417 Radiculopathy, lumbosacral region: Secondary | ICD-10-CM | POA: Diagnosis not present

## 2022-11-01 DIAGNOSIS — I10 Essential (primary) hypertension: Secondary | ICD-10-CM | POA: Diagnosis not present

## 2022-11-13 DIAGNOSIS — M5442 Lumbago with sciatica, left side: Secondary | ICD-10-CM | POA: Diagnosis not present

## 2022-12-04 ENCOUNTER — Ambulatory Visit: Payer: Medicare Other | Attending: Neurosurgery

## 2022-12-04 ENCOUNTER — Encounter: Payer: Self-pay | Admitting: Pulmonary Disease

## 2022-12-04 DIAGNOSIS — M5459 Other low back pain: Secondary | ICD-10-CM | POA: Diagnosis not present

## 2022-12-04 DIAGNOSIS — M6281 Muscle weakness (generalized): Secondary | ICD-10-CM | POA: Diagnosis not present

## 2022-12-04 DIAGNOSIS — M5442 Lumbago with sciatica, left side: Secondary | ICD-10-CM | POA: Insufficient documentation

## 2022-12-04 DIAGNOSIS — R293 Abnormal posture: Secondary | ICD-10-CM

## 2022-12-04 NOTE — Therapy (Signed)
OUTPATIENT PHYSICAL THERAPY THORACOLUMBAR EVALUATION   Patient Name: Cameron Todd MRN: [id number] DOB:1932-10-27, 89 y.o., male Today's Date: 12/04/2022  END OF SESSION:  PT End of Session - 12/04/22 1512     Visit Number 1    Date for PT Re-Evaluation 02/26/23    PT Start Time 1515    PT Stop Time 1600    PT Time Calculation (min) 45 min    Activity Tolerance Patient tolerated treatment well    Behavior During Therapy WFL for tasks assessed/performed             Past Medical History:  Diagnosis Date   Arthritis    Barrett's esophagus    Compression fracture of spine (HCC)    T 10   Diabetes (HCC)    Elevated cholesterol    Gastric ulcer 1990   Hemorrhoids    History of colon polyps    Hypertension    Tubular adenoma of colon    Past Surgical History:  Procedure Laterality Date   cataract surgery bilateral     NO PAST SURGERIES     UPPER GASTROINTESTINAL ENDOSCOPY     Patient Active Problem List   Diagnosis Date Noted   Age related osteoporosis 11/09/2020   Cold intolerance 11/09/2020   Polyneuropathy 11/09/2020   Vitamin D deficiency 11/09/2020   Dorsalgia 11/09/2020   Controlled type 2 diabetes mellitus without complication, without long-term current use of insulin (HCC) 11/09/2020   Hypertensive heart and renal disease 11/09/2020   Benign prostatic hyperplasia with urinary hesitancy 11/09/2020   Barrett's esophagus 05/20/2013   HTN (hypertension) 05/20/2013   GENERALIZED OSTEOARTHROSIS UNSPECIFIED SITE 06/10/2008   EPIGASTRIC PAIN 06/10/2008   GASTRIC ULCER, HX OF 06/10/2008   INTERNAL HEMORRHOIDS 06/08/2008    PCP: Nadene Rubins  REFERRING PROVIDER: Coletta Memos  REFERRING DIAG:  M54.42 (ICD-10-CM) - Lumbago with sciatica, left side    Rationale for Evaluation and Treatment: Rehabilitation  THERAPY DIAG:  No diagnosis found.  ONSET DATE: 11/27/22  SUBJECTIVE:                                                                                                                                                                                            SUBJECTIVE STATEMENT: Doing okay, I had a fall like 10-12 years ago and had a compression fracture. Then just recently I got pain on my L hip shooting my leg all the way down to my ankle. Sometimes I get numbness.   PERTINENT HISTORY:  No surgical hx, arthritis   PAIN:  Are you having pain? Yes: NPRS scale: 7/10 Pain location: L hip down my  leg, low back Pain description: achy, sharp, radiating down leg, constant and never goes away, numbness Aggravating factors: standing, walking Relieving factors: just the medicine but only for a short time  PRECAUTIONS: None  WEIGHT BEARING RESTRICTIONS: No  FALLS:  Has patient fallen in last 6 months? No  LIVING ENVIRONMENT: Lives with: lives with their spouse Lives in: House/apartment Stairs: Yes: External: 3-4 steps; on left going up Has following equipment at home: None  OCCUPATION: Retired  PLOF: Independent and Independent with basic ADLs  PATIENT GOALS: some how get rid of the pain in my back and leg   NEXT MD VISIT: 12/11/22- spine doctor for injection  OBJECTIVE:   DIAGNOSTIC FINDINGS:  IMPRESSION: 1. Left lateral recess narrowing at L4-L5 and L5-S1, which may be a source of left L5 and/or S1 radiculopathy. 2. No central spinal canal or neural foraminal stenosis. 3. Chronic compression deformity of T11 with mild narrowing of the ventral thecal sac.   SCREENING FOR RED FLAGS: Bowel or bladder incontinence: No Spinal tumors: No Cauda equina syndrome: No Compression fracture: No Abdominal aneurysm: No  COGNITION: Overall cognitive status: Within functional limits for tasks assessed     SENSATION: WFL  MUSCLE LENGTH: Hamstrings: extremely tight in BLE  POSTURE: rounded shoulders, forward head, increased thoracic kyphosis, posterior pelvic tilt, and flexed trunk   PALPATION: TTP SIJ, L4-L5  LUMBAR ROM:    AROM eval  Flexion 75% pain in low back  Extension 25% with pain  Right lateral flexion Fib head   Left lateral flexion Fib head, some pain  Right rotation 25% with pain  Left rotation 25% with pain   (Blank rows = not tested)  LOWER EXTREMITY ROM:   grossly WFL    LOWER EXTREMITY MMT:  grossly 4-4+/5    LUMBAR SPECIAL TESTS:  Straight leg raise test: Positive and Slump test: Negative  FUNCTIONAL TESTS:  5 times sit to stand: 14.34s Timed up and go (TUG): 19.01s  GAIT: Distance walked: in clinic distances Assistive device utilized: None Level of assistance: Modified independence Comments: flexed trunk, decreased foot clearance, forward head, kyphotic posture, increased posterior pelvic tilt, slowed speed   TODAY'S TREATMENT:                                                                                                                              DATE: 12/04/22- EVAL     PATIENT EDUCATION:  Education details: POC and HEP Person educated: Patient and Child(ren) Education method: Explanation Education comprehension: verbalized understanding  HOME EXERCISE PROGRAM: Access Code: [email or web address] URL: [email or web address] Date: 12/04/2022 Prepared by: Cassie Freer  Exercises - Supine Lower Trunk Rotation  - 1 x daily - 7 x weekly - 2 sets - 10 reps - Supine Bridge  - 1 x daily - 7 x weekly - 2 sets - 10 reps - Supine Single Knee to Chest Stretch  - 2 x daily - 7 x weekly - 30 hold -  Supine Active Straight Leg Raise  - 1 x daily - 7 x weekly - 2 sets - 10 reps - Seated Hamstring Stretch  - 2 x daily - 7 x weekly - 30 hold  ASSESSMENT:  CLINICAL IMPRESSION: Patient is a 89 y.o. male who was seen today for physical therapy evaluation and treatment for low back and L hip pain. He presents with increase thoracic kyphosis, and heavy posterior pelvic tilt. He has good range and strength in his lower extremities but he is extremely tight in his hamstrings. His  posture is his biggest limiting factor and plays a role in his pain. Was educated about the importance of working on postural strengthening to decrease forces being dispersed through low back. Patient will benefit from skilled PT to address his muscle imbalances in order to correct his posture as best as possible and improve his mobility and QOL.  OBJECTIVE IMPAIRMENTS: Abnormal gait, difficulty walking, decreased strength, improper body mechanics, postural dysfunction, and pain.   ACTIVITY LIMITATIONS: bending, sitting, standing, stairs, and locomotion level   REHAB POTENTIAL: Good  CLINICAL DECISION MAKING: Stable/uncomplicated  EVALUATION COMPLEXITY: Low  GOALS: Goals reviewed with patient? Yes  SHORT TERM GOALS: Target date: 01/15/23  Patient will be independent with initial HEP.  Goal status: INITIAL  2.  Patient will report centralization of radicular symptoms.  Baseline: radiating pain in to leg Goal status: INITIAL  3.  Patient will demonstrate TUG < 15s  Baseline: 19.01s Goal status: INITIAL    LONG TERM GOALS: Target date: 02/26/23  Patient will be independent with advanced/ongoing HEP to improve outcomes and carryover.  Goal status: INITIAL  2.  Patient will report 75% improvement in low back pain to improve QOL.  Baseline: 7/10 Goal status: INITIAL  3.  Patient will demonstrate full pain free lumbar ROM to perform ADLs.   Goal status: INITIAL   4.  Patient to demonstrate ability to achieve and maintain good spinal alignment/posturing and body mechanics needed for daily activities. Baseline: forward head, kyphosis, posterior pelvis tilt Goal status: INITIAL  PLAN:  PT FREQUENCY: 2x/week  PT DURATION: 12 weeks  PLANNED INTERVENTIONS: Therapeutic exercises, Therapeutic activity, Neuromuscular re-education, Balance training, Gait training, Patient/Family education, Self Care, Joint mobilization, Joint manipulation, Stair training, Dry Needling, Electrical  stimulation, Spinal manipulation, Spinal mobilization, Cryotherapy, Moist heat, and Manual therapy.  PLAN FOR NEXT SESSION: NuStep, postural exercises for upper back and pelvis   Cassie Freer, PT 12/04/2022, 4:08 PM

## 2022-12-11 DIAGNOSIS — M5416 Radiculopathy, lumbar region: Secondary | ICD-10-CM | POA: Diagnosis not present

## 2022-12-12 ENCOUNTER — Ambulatory Visit: Payer: Medicare Other | Admitting: Physical Therapy

## 2022-12-12 ENCOUNTER — Encounter: Payer: Self-pay | Admitting: Physical Therapy

## 2022-12-12 DIAGNOSIS — M5442 Lumbago with sciatica, left side: Secondary | ICD-10-CM

## 2022-12-12 DIAGNOSIS — R293 Abnormal posture: Secondary | ICD-10-CM | POA: Diagnosis not present

## 2022-12-12 DIAGNOSIS — M5459 Other low back pain: Secondary | ICD-10-CM | POA: Diagnosis not present

## 2022-12-12 DIAGNOSIS — M6281 Muscle weakness (generalized): Secondary | ICD-10-CM | POA: Diagnosis not present

## 2022-12-12 NOTE — Therapy (Signed)
OUTPATIENT PHYSICAL THERAPY THORACOLUMBAR TREATMENT   Patient Name: Cameron Todd MRN: [id number] DOB:20-Aug-1932, 89 y.o., male Today's Date: 12/12/2022  END OF SESSION:  PT End of Session - 12/12/22 1339     Visit Number 2    Date for PT Re-Evaluation 02/26/23    PT Start Time 1340    PT Stop Time 1425    PT Time Calculation (min) 45 min    Activity Tolerance Patient tolerated treatment well    Behavior During Therapy WFL for tasks assessed/performed             Past Medical History:  Diagnosis Date   Arthritis    Barrett's esophagus    Compression fracture of spine (HCC)    T 10   Diabetes (HCC)    Elevated cholesterol    Gastric ulcer 1990   Hemorrhoids    History of colon polyps    Hypertension    Tubular adenoma of colon    Past Surgical History:  Procedure Laterality Date   cataract surgery bilateral     NO PAST SURGERIES     UPPER GASTROINTESTINAL ENDOSCOPY     Patient Active Problem List   Diagnosis Date Noted   Age related osteoporosis 11/09/2020   Cold intolerance 11/09/2020   Polyneuropathy 11/09/2020   Vitamin D deficiency 11/09/2020   Dorsalgia 11/09/2020   Controlled type 2 diabetes mellitus without complication, without long-term current use of insulin (HCC) 11/09/2020   Hypertensive heart and renal disease 11/09/2020   Benign prostatic hyperplasia with urinary hesitancy 11/09/2020   Barrett's esophagus 05/20/2013   HTN (hypertension) 05/20/2013   GENERALIZED OSTEOARTHROSIS UNSPECIFIED SITE 06/10/2008   EPIGASTRIC PAIN 06/10/2008   GASTRIC ULCER, HX OF 06/10/2008   INTERNAL HEMORRHOIDS 06/08/2008    PCP: Nadene Rubins  REFERRING PROVIDER: Coletta Memos  REFERRING DIAG:  M54.42 (ICD-10-CM) - Lumbago with sciatica, left side    Rationale for Evaluation and Treatment: Rehabilitation  THERAPY DIAG:  Abnormal posture  Left-sided low back pain with left-sided sciatica, unspecified chronicity  Other low back pain  Muscle weakness  (generalized)  ONSET DATE: 11/27/22  SUBJECTIVE:                                                                                                                                                                                           SUBJECTIVE STATEMENT: About the same, nothing much changed, Got the injection yesterday. Pain meds help a couple hours.  PERTINENT HISTORY:  No surgical hx, arthritis   PAIN:  Are you having pain? Yes: NPRS scale: 7/10 Pain location: L hip down my leg, low back Pain description: achy,  sharp, radiating down leg, constant and never goes away, numbness Aggravating factors: standing, walking Relieving factors: just the medicine but only for a short time  PRECAUTIONS: None  WEIGHT BEARING RESTRICTIONS: No  FALLS:  Has patient fallen in last 6 months? No  LIVING ENVIRONMENT: Lives with: lives with their spouse Lives in: House/apartment Stairs: Yes: External: 3-4 steps; on left going up Has following equipment at home: None  OCCUPATION: Retired  PLOF: Independent and Independent with basic ADLs  PATIENT GOALS: some how get rid of the pain in my back and leg   NEXT MD VISIT: 12/11/22- spine doctor for injection  OBJECTIVE:   DIAGNOSTIC FINDINGS:  IMPRESSION: 1. Left lateral recess narrowing at L4-L5 and L5-S1, which may be a source of left L5 and/or S1 radiculopathy. 2. No central spinal canal or neural foraminal stenosis. 3. Chronic compression deformity of T11 with mild narrowing of the ventral thecal sac.   SCREENING FOR RED FLAGS: Bowel or bladder incontinence: No Spinal tumors: No Cauda equina syndrome: No Compression fracture: No Abdominal aneurysm: No  COGNITION: Overall cognitive status: Within functional limits for tasks assessed     SENSATION: WFL  MUSCLE LENGTH: Hamstrings: extremely tight in BLE  POSTURE: rounded shoulders, forward head, increased thoracic kyphosis, posterior pelvic tilt, and flexed trunk    PALPATION: TTP SIJ, L4-L5  LUMBAR ROM:   AROM eval  Flexion 75% pain in low back  Extension 25% with pain  Right lateral flexion Fib head   Left lateral flexion Fib head, some pain  Right rotation 25% with pain  Left rotation 25% with pain   (Blank rows = not tested)  LOWER EXTREMITY ROM:   grossly WFL    LOWER EXTREMITY MMT:  grossly 4-4+/5    LUMBAR SPECIAL TESTS:  Straight leg raise test: Positive and Slump test: Negative  FUNCTIONAL TESTS:  5 times sit to stand: 14.34s Timed up and go (TUG): 19.01s  GAIT: Distance walked: in clinic distances Assistive device utilized: None Level of assistance: Modified independence Comments: flexed trunk, decreased foot clearance, forward head, kyphotic posture, increased posterior pelvic tilt, slowed speed   TODAY'S TREATMENT:                                                                                                                              DATE:  12/12/22 NuStep L4 x 6 min Bilat shoulder flex w/ lumbar Ext 2lb WaTE x10 Horiz abd red 2x10 Shoulder Ext green 2x10 Bridges 3x5 Passive HS, piriformis, K2C stretch LE on [place] bridges, K2C, Oblq   12/04/22- EVAL     PATIENT EDUCATION:  Education details: POC and HEP Person educated: Patient and Child(ren) Education method: Explanation Education comprehension: verbalized understanding  HOME EXERCISE PROGRAM: Access Code: [email or web address] URL: [email or web address] Date: 12/04/2022 Prepared by: Cassie Freer  Exercises - Supine Lower Trunk Rotation  - 1 x daily - 7 x weekly - 2 sets - 10 reps - Supine Bridge  -  1 x daily - 7 x weekly - 2 sets - 10 reps - Supine Single Knee to Chest Stretch  - 2 x daily - 7 x weekly - 30 hold - Supine Active Straight Leg Raise  - 1 x daily - 7 x weekly - 2 sets - 10 reps - Seated Hamstring Stretch  - 2 x daily - 7 x weekly - 30 hold  ASSESSMENT:  CLINICAL IMPRESSION: Patient is a 89 y.o. male who was seen today for  physical therapy treatment for low back and L hip pain. He presents with increase thoracic kyphosis, and heavy posterior pelvic tilt. His posture is his biggest limiting factor and plays a role in his pain. Introduced pt with some postural strengthening interventions. He has a difficult time manipulating his alignment even with tactile cues. A stretch reported with shoulder flex. Patient will benefit from skilled PT to address his muscle imbalances in order to correct his posture as best as possible and improve his mobility and QOL.  OBJECTIVE IMPAIRMENTS: Abnormal gait, difficulty walking, decreased strength, improper body mechanics, postural dysfunction, and pain.   ACTIVITY LIMITATIONS: bending, sitting, standing, stairs, and locomotion level   REHAB POTENTIAL: Good  CLINICAL DECISION MAKING: Stable/uncomplicated  EVALUATION COMPLEXITY: Low  GOALS: Goals reviewed with patient? Yes  SHORT TERM GOALS: Target date: 01/15/23  Patient will be independent with initial HEP.  Goal status: INITIAL  2.  Patient will report centralization of radicular symptoms.  Baseline: radiating pain in to leg Goal status: INITIAL  3.  Patient will demonstrate TUG < 15s  Baseline: 19.01s Goal status: INITIAL    LONG TERM GOALS: Target date: 02/26/23  Patient will be independent with advanced/ongoing HEP to improve outcomes and carryover.  Goal status: INITIAL  2.  Patient will report 75% improvement in low back pain to improve QOL.  Baseline: 7/10 Goal status: INITIAL  3.  Patient will demonstrate full pain free lumbar ROM to perform ADLs.   Goal status: INITIAL   4.  Patient to demonstrate ability to achieve and maintain good spinal alignment/posturing and body mechanics needed for daily activities. Baseline: forward head, kyphosis, posterior pelvis tilt Goal status: INITIAL  PLAN:  PT FREQUENCY: 2x/week  PT DURATION: 12 weeks  PLANNED INTERVENTIONS: Therapeutic exercises, Therapeutic  activity, Neuromuscular re-education, Balance training, Gait training, Patient/Family education, Self Care, Joint mobilization, Joint manipulation, Stair training, Dry Needling, Electrical stimulation, Spinal manipulation, Spinal mobilization, Cryotherapy, Moist heat, and Manual therapy.  PLAN FOR NEXT SESSION: NuStep, postural exercises for upper back and pelvis   Grayce Sessions, PTA 12/12/2022, 1:40 PM

## 2022-12-14 ENCOUNTER — Ambulatory Visit: Payer: Medicare Other | Admitting: Physical Therapy

## 2022-12-14 DIAGNOSIS — R293 Abnormal posture: Secondary | ICD-10-CM | POA: Diagnosis not present

## 2022-12-14 DIAGNOSIS — M5459 Other low back pain: Secondary | ICD-10-CM | POA: Diagnosis not present

## 2022-12-14 DIAGNOSIS — M6281 Muscle weakness (generalized): Secondary | ICD-10-CM

## 2022-12-14 DIAGNOSIS — M5442 Lumbago with sciatica, left side: Secondary | ICD-10-CM | POA: Diagnosis not present

## 2022-12-14 NOTE — Therapy (Signed)
OUTPATIENT PHYSICAL THERAPY THORACOLUMBAR TREATMENT   Patient Name: Cameron Todd MRN: [id number] DOB:[birth year], 89 y.o., male Today's Date: 12/14/2022  END OF SESSION:  PT End of Session - 12/14/22 1451     Visit Number 3    Date for PT Re-Evaluation 02/26/23    PT Start Time 1351    PT Stop Time 1435    PT Time Calculation (min) 44 min             Past Medical History:  Diagnosis Date   Arthritis    Barrett's esophagus    Compression fracture of spine (HCC)    T 10   Diabetes (HCC)    Elevated cholesterol    Gastric ulcer 1990   Hemorrhoids    History of colon polyps    Hypertension    Tubular adenoma of colon    Past Surgical History:  Procedure Laterality Date   cataract surgery bilateral     NO PAST SURGERIES     UPPER GASTROINTESTINAL ENDOSCOPY     Patient Active Problem List   Diagnosis Date Noted   Age related osteoporosis 11/09/2020   Cold intolerance 11/09/2020   Polyneuropathy 11/09/2020   Vitamin D deficiency 11/09/2020   Dorsalgia 11/09/2020   Controlled type 2 diabetes mellitus without complication, without long-term current use of insulin (HCC) 11/09/2020   Hypertensive heart and renal disease 11/09/2020   Benign prostatic hyperplasia with urinary hesitancy 11/09/2020   Barrett's esophagus 05/20/2013   HTN (hypertension) 05/20/2013   GENERALIZED OSTEOARTHROSIS UNSPECIFIED SITE 06/10/2008   EPIGASTRIC PAIN 06/10/2008   GASTRIC ULCER, HX OF 06/10/2008   INTERNAL HEMORRHOIDS 06/08/2008    PCP: Nadene Rubins  REFERRING PROVIDER: Coletta Memos  REFERRING DIAG:  M54.42 (ICD-10-CM) - Lumbago with sciatica, left side    Rationale for Evaluation and Treatment: Rehabilitation  THERAPY DIAG:  Abnormal posture  Left-sided low back pain with left-sided sciatica, unspecified chronicity  Other low back pain  Muscle weakness (generalized)  ONSET DATE: 11/27/22  SUBJECTIVE:                                                                                                                                                                                            SUBJECTIVE STATEMENT: "SAME" no relief from injection PERTINENT HISTORY:  No surgical hx, arthritis   PAIN:  Are you having pain? Yes: NPRS scale: 7/10 Pain location: L hip down my leg, low back Pain description: achy, sharp, radiating down leg, constant and never goes away, numbness Aggravating factors: standing, walking Relieving factors: just the medicine but only for a short time  PRECAUTIONS: None  WEIGHT BEARING  RESTRICTIONS: No  FALLS:  Has patient fallen in last 6 months? No  LIVING ENVIRONMENT: Lives with: lives with their spouse Lives in: House/apartment Stairs: Yes: External: 3-4 steps; on left going up Has following equipment at home: None  OCCUPATION: Retired  PLOF: Independent and Independent with basic ADLs  PATIENT GOALS: some how get rid of the pain in my back and leg   NEXT MD VISIT: 12/11/22- spine doctor for injection  OBJECTIVE:   DIAGNOSTIC FINDINGS:  IMPRESSION: 1. Left lateral recess narrowing at L4-L5 and L5-S1, which may be a source of left L5 and/or S1 radiculopathy. 2. No central spinal canal or neural foraminal stenosis. 3. Chronic compression deformity of T11 with mild narrowing of the ventral thecal sac.   SCREENING FOR RED FLAGS: Bowel or bladder incontinence: No Spinal tumors: No Cauda equina syndrome: No Compression fracture: No Abdominal aneurysm: No  COGNITION: Overall cognitive status: Within functional limits for tasks assessed     SENSATION: WFL  MUSCLE LENGTH: Hamstrings: extremely tight in BLE  POSTURE: rounded shoulders, forward head, increased thoracic kyphosis, posterior pelvic tilt, and flexed trunk   PALPATION: TTP SIJ, L4-L5  LUMBAR ROM:   AROM eval  Flexion 75% pain in low back  Extension 25% with pain  Right lateral flexion Fib head   Left lateral flexion Fib head, some pain   Right rotation 25% with pain  Left rotation 25% with pain   (Blank rows = not tested)  LOWER EXTREMITY ROM:   grossly WFL    LOWER EXTREMITY MMT:  grossly 4-4+/5    LUMBAR SPECIAL TESTS:  Straight leg raise test: Positive and Slump test: Negative  FUNCTIONAL TESTS:  5 times sit to stand: 14.34s Timed up and go (TUG): 19.01s  GAIT: Distance walked: in clinic distances Assistive device utilized: None Level of assistance: Modified independence Comments: flexed trunk, decreased foot clearance, forward head, kyphotic posture, increased posterior pelvic tilt, slowed speed   TODAY'S TREATMENT:                                                                                                                              DATE:   12/14/22 Dyna disc pelvic mobility 4 way 10 x each On dyna disc green tband shld ext and row 2 sets 10.Horiz abd red 2x10 Passive HS, piriformis, K2C stretch, trunk rotation- very tight BIl Left > RT and Left side more painful LE on Pball bridges, K2C, Oblq Isometric abdominals  Supine 2# hand wts horz abd hold 5 sec 10 x to open up chest Nustep L 4     12/12/22 NuStep L4 x 6 min Bilat shoulder flex w/ lumbar Ext 2lb WaTE x10 Horiz abd red 2x10 Shoulder Ext green 2x10 Bridges 3x5 Passive HS, piriformis, K2C stretch LE on Pball bridges, K2C, Oblq   12/04/22- EVAL     PATIENT EDUCATION:  Education details: POC and HEP Person educated: Patient and Child(ren) Education method: Explanation Education  comprehension: verbalized understanding  HOME EXERCISE PROGRAM: Access Code: [email or web address] URL: [email or web address] Date: 12/04/2022 Prepared by: Cassie Freer  Exercises - Supine Lower Trunk Rotation  - 1 x daily - 7 x weekly - 2 sets - 10 reps - Supine Bridge  - 1 x daily - 7 x weekly - 2 sets - 10 reps - Supine Single Knee to Chest Stretch  - 2 x daily - 7 x weekly - 30 hold - Supine Active Straight Leg Raise  - 1 x daily - 7 x  weekly - 2 sets - 10 reps - Seated Hamstring Stretch  - 2 x daily - 7 x weekly - 30 hold  ASSESSMENT:  CLINICAL IMPRESSION: Patient is a 89 y.o. male who was seen today for physical therapy treatment for low back and L hip pain. He presents with increase thoracic kyphosis, and heavy posterior pelvic tilt. His posture is his biggest limiting factor and plays a role in his pain. Continued with postural strengthening interventions. He has a difficult time manipulating his alignment even with tactile cues. very tight BIl Left > RT and Left side more painful with MT OBJECTIVE IMPAIRMENTS: Abnormal gait, difficulty walking, decreased strength, improper body mechanics, postural dysfunction, and pain.   ACTIVITY LIMITATIONS: bending, sitting, standing, stairs, and locomotion level   REHAB POTENTIAL: Good  CLINICAL DECISION MAKING: Stable/uncomplicated  EVALUATION COMPLEXITY: Low  GOALS: Goals reviewed with patient? Yes  SHORT TERM GOALS: Target date: 01/15/23  Patient will be independent with initial HEP.  Goal status: INITIAL  2.  Patient will report centralization of radicular symptoms.  Baseline: radiating pain in to leg Goal status: INITIAL  3.  Patient will demonstrate TUG < 15s  Baseline: 19.01s Goal status: INITIAL    LONG TERM GOALS: Target date: 02/26/23  Patient will be independent with advanced/ongoing HEP to improve outcomes and carryover.  Goal status: INITIAL  2.  Patient will report 75% improvement in low back pain to improve QOL.  Baseline: 7/10 Goal status: INITIAL  3.  Patient will demonstrate full pain free lumbar ROM to perform ADLs.   Goal status: INITIAL   4.  Patient to demonstrate ability to achieve and maintain good spinal alignment/posturing and body mechanics needed for daily activities. Baseline: forward head, kyphosis, posterior pelvis tilt Goal status: INITIAL  PLAN:  PT FREQUENCY: 2x/week  PT DURATION: 12 weeks  PLANNED INTERVENTIONS:  Therapeutic exercises, Therapeutic activity, Neuromuscular re-education, Balance training, Gait training, Patient/Family education, Self Care, Joint mobilization, Joint manipulation, Stair training, Dry Needling, Electrical stimulation, Spinal manipulation, Spinal mobilization, Cryotherapy, Moist heat, and Manual therapy.  PLAN FOR NEXT SESSION: NuStep, postural exercises for upper back and pelvis   Jacky Hartung,ANGIE, PTA 12/14/2022, 2:51 PM [facility] at [facility] [address] [facility] [place], [place], [address] Phone: [phone]   Fax:  [phone]  Patient Details  Name: Cameron Todd MRN: [id number] Date of Birth: 04/17/1933 Referring Provider:  Mliss Sax,*  Encounter Date: 12/14/2022   Suanne Marker, PTA 12/14/2022, 2:51 PM  [facility] [address] [place], [place], [address] Phone: [phone]   Fax:  [phone]

## 2022-12-19 ENCOUNTER — Ambulatory Visit: Payer: Medicare Other | Admitting: Physical Therapy

## 2022-12-19 ENCOUNTER — Encounter: Payer: Self-pay | Admitting: Physical Therapy

## 2022-12-19 ENCOUNTER — Ambulatory Visit: Payer: Medicare Other

## 2022-12-19 DIAGNOSIS — M5459 Other low back pain: Secondary | ICD-10-CM

## 2022-12-19 DIAGNOSIS — M6281 Muscle weakness (generalized): Secondary | ICD-10-CM

## 2022-12-19 DIAGNOSIS — M5442 Lumbago with sciatica, left side: Secondary | ICD-10-CM | POA: Diagnosis not present

## 2022-12-19 DIAGNOSIS — R293 Abnormal posture: Secondary | ICD-10-CM | POA: Diagnosis not present

## 2022-12-19 NOTE — Therapy (Signed)
OUTPATIENT PHYSICAL THERAPY THORACOLUMBAR TREATMENT   Patient Name: Cameron Todd MRN: [id number] DOB:[birth year], 89 y.o., male Today's Date: 12/19/2022  END OF SESSION:  PT End of Session - 12/19/22 1512     Visit Number 4    Date for PT Re-Evaluation 02/26/23    PT Start Time 1515    PT Stop Time 1600    PT Time Calculation (min) 45 min    Activity Tolerance Patient tolerated treatment well    Behavior During Therapy WFL for tasks assessed/performed             Past Medical History:  Diagnosis Date   Arthritis    Barrett's esophagus    Compression fracture of spine (HCC)    T 10   Diabetes (HCC)    Elevated cholesterol    Gastric ulcer 1990   Hemorrhoids    History of colon polyps    Hypertension    Tubular adenoma of colon    Past Surgical History:  Procedure Laterality Date   cataract surgery bilateral     NO PAST SURGERIES     UPPER GASTROINTESTINAL ENDOSCOPY     Patient Active Problem List   Diagnosis Date Noted   Age related osteoporosis 11/09/2020   Cold intolerance 11/09/2020   Polyneuropathy 11/09/2020   Vitamin D deficiency 11/09/2020   Dorsalgia 11/09/2020   Controlled type 2 diabetes mellitus without complication, without long-term current use of insulin (HCC) 11/09/2020   Hypertensive heart and renal disease 11/09/2020   Benign prostatic hyperplasia with urinary hesitancy 11/09/2020   Barrett's esophagus 05/20/2013   HTN (hypertension) 05/20/2013   GENERALIZED OSTEOARTHROSIS UNSPECIFIED SITE 06/10/2008   EPIGASTRIC PAIN 06/10/2008   GASTRIC ULCER, HX OF 06/10/2008   INTERNAL HEMORRHOIDS 06/08/2008    PCP: Nadene Rubins  REFERRING PROVIDER: Coletta Memos  REFERRING DIAG:  M54.42 (ICD-10-CM) - Lumbago with sciatica, left side    Rationale for Evaluation and Treatment: Rehabilitation  THERAPY DIAG:  Abnormal posture  Left-sided low back pain with left-sided sciatica, unspecified chronicity  Muscle weakness (generalized)  Other  low back pain  ONSET DATE: 11/27/22  SUBJECTIVE:                                                                                                                                                                                           SUBJECTIVE STATEMENT: "Pretty much the same" PERTINENT HISTORY:  No surgical hx, arthritis   PAIN:  Are you having pain? Yes: NPRS scale: 6/10 Pain location: L hip down my leg, low back Pain description: achy, sharp, radiating down leg, constant and never goes away, numbness Aggravating factors: standing,  walking Relieving factors: just the medicine but only for a short time  PRECAUTIONS: None  WEIGHT BEARING RESTRICTIONS: No  FALLS:  Has patient fallen in last 6 months? No  LIVING ENVIRONMENT: Lives with: lives with their spouse Lives in: House/apartment Stairs: Yes: External: 3-4 steps; on left going up Has following equipment at home: None  OCCUPATION: Retired  PLOF: Independent and Independent with basic ADLs  PATIENT GOALS: some how get rid of the pain in my back and leg   NEXT MD VISIT: 12/11/22- spine doctor for injection  OBJECTIVE:   DIAGNOSTIC FINDINGS:  IMPRESSION: 1. Left lateral recess narrowing at L4-L5 and L5-S1, which may be a source of left L5 and/or S1 radiculopathy. 2. No central spinal canal or neural foraminal stenosis. 3. Chronic compression deformity of T11 with mild narrowing of the ventral thecal sac.   SCREENING FOR RED FLAGS: Bowel or bladder incontinence: No Spinal tumors: No Cauda equina syndrome: No Compression fracture: No Abdominal aneurysm: No  COGNITION: Overall cognitive status: Within functional limits for tasks assessed     SENSATION: WFL  MUSCLE LENGTH: Hamstrings: extremely tight in BLE  POSTURE: rounded shoulders, forward head, increased thoracic kyphosis, posterior pelvic tilt, and flexed trunk   PALPATION: TTP SIJ, L4-L5  LUMBAR ROM:   AROM eval  Flexion 75% pain in low back   Extension 25% with pain  Right lateral flexion Fib head   Left lateral flexion Fib head, some pain  Right rotation 25% with pain  Left rotation 25% with pain   (Blank rows = not tested)  LOWER EXTREMITY ROM:   grossly WFL    LOWER EXTREMITY MMT:  grossly 4-4+/5    LUMBAR SPECIAL TESTS:  Straight leg raise test: Positive and Slump test: Negative  FUNCTIONAL TESTS:  5 times sit to stand: 14.34s Timed up and go (TUG): 19.01s  GAIT: Distance walked: in clinic distances Assistive device utilized: None Level of assistance: Modified independence Comments: flexed trunk, decreased foot clearance, forward head, kyphotic posture, increased posterior pelvic tilt, slowed speed   TODAY'S TREATMENT:                                                                                                                              DATE:  12/19/22 NuStep L 5 x 7 min Rows and Ext green 2x10 each S2S OHP red ball 2x10  Seated trunk rotation yellow ball  STM to para spinales of T spine Horiz abd green 2x10  Some over pressure on shoulder with pt in supine  Bridges    12/14/22 Dyna disc pelvic mobility 4 way 10 x each On dyna disc green tband shld ext and row 2 sets 10.Horiz abd red 2x10 Passive HS, piriformis, K2C stretch, trunk rotation- very tight BIl Left > RT and Left side more painful LE on Pball bridges, K2C, Oblq Isometric abdominals  Supine 2# hand wts horz abd hold 5 sec 10 x to open up chest Nustep  L 4 Passive HS, piriformis, ITB, K2C stretch   12/12/22 NuStep L4 x 6 min Bilat shoulder flex w/ lumbar Ext 2lb WaTE x10 Horiz abd red 2x10 Shoulder Ext green 2x10 Bridges 3x5 Passive HS, piriformis, K2C stretch LE on Pball bridges, K2C, Oblq   12/04/22- EVAL     PATIENT EDUCATION:  Education details: POC and HEP Person educated: Patient and Child(ren) Education method: Explanation Education comprehension: verbalized understanding  HOME EXERCISE PROGRAM: Access Code:  [email or web address] URL: [email or web address] Date: 12/04/2022 Prepared by: Cassie Freer  Exercises - Supine Lower Trunk Rotation  - 1 x daily - 7 x weekly - 2 sets - 10 reps - Supine Bridge  - 1 x daily - 7 x weekly - 2 sets - 10 reps - Supine Single Knee to Chest Stretch  - 2 x daily - 7 x weekly - 30 hold - Supine Active Straight Leg Raise  - 1 x daily - 7 x weekly - 2 sets - 10 reps - Seated Hamstring Stretch  - 2 x daily - 7 x weekly - 30 hold  ASSESSMENT:  CLINICAL IMPRESSION: Patient is a 89 y.o. male who was seen today for physical therapy treatment for low back and L hip pain. He presents with increase thoracic kyphosis, and heavy posterior pelvic tilt. His posture is his biggest limiting factor and plays a role in his pain. Continued with postural strengthening interventions. He has a difficult time manipulating his alignment even with tactile cues. Limited trunk rotation with intervention.    OBJECTIVE IMPAIRMENTS: Abnormal gait, difficulty walking, decreased strength, improper body mechanics, postural dysfunction, and pain.   ACTIVITY LIMITATIONS: bending, sitting, standing, stairs, and locomotion level   REHAB POTENTIAL: Good  CLINICAL DECISION MAKING: Stable/uncomplicated  EVALUATION COMPLEXITY: Low  GOALS: Goals reviewed with patient? Yes  SHORT TERM GOALS: Target date: 01/15/23  Patient will be independent with initial HEP.  Goal status: INITIAL  2.  Patient will report centralization of radicular symptoms.  Baseline: radiating pain in to leg Goal status: INITIAL  3.  Patient will demonstrate TUG < 15s  Baseline: 19.01s Goal status: INITIAL    LONG TERM GOALS: Target date: 02/26/23  Patient will be independent with advanced/ongoing HEP to improve outcomes and carryover.  Goal status: INITIAL  2.  Patient will report 75% improvement in low back pain to improve QOL.  Baseline: 7/10 Goal status: INITIAL  3.  Patient will demonstrate full pain Todd  lumbar ROM to perform ADLs.   Goal status: INITIAL   4.  Patient to demonstrate ability to achieve and maintain good spinal alignment/posturing and body mechanics needed for daily activities. Baseline: forward head, kyphosis, posterior pelvis tilt Goal status: INITIAL  PLAN:  PT FREQUENCY: 2x/week  PT DURATION: 12 weeks  PLANNED INTERVENTIONS: Therapeutic exercises, Therapeutic activity, Neuromuscular re-education, Balance training, Gait training, Patient/Family education, Self Care, Joint mobilization, Joint manipulation, Stair training, Dry Needling, Electrical stimulation, Spinal manipulation, Spinal mobilization, Cryotherapy, Moist heat, and Manual therapy.  PLAN FOR NEXT SESSION: NuStep, postural exercises for upper back and pelvis   Grayce Sessions, PTA 12/19/2022, 3:12 PM [facility] at [facility] [address] [facility]. [place], [place], [address] Phone: [phone]   Fax:  [phone]

## 2022-12-21 ENCOUNTER — Ambulatory Visit: Payer: Medicare Other

## 2022-12-21 ENCOUNTER — Ambulatory Visit: Payer: Medicare Other | Admitting: Physical Therapy

## 2022-12-21 ENCOUNTER — Encounter: Payer: Self-pay | Admitting: Physical Therapy

## 2022-12-21 DIAGNOSIS — M6281 Muscle weakness (generalized): Secondary | ICD-10-CM | POA: Diagnosis not present

## 2022-12-21 DIAGNOSIS — M5459 Other low back pain: Secondary | ICD-10-CM | POA: Diagnosis not present

## 2022-12-21 DIAGNOSIS — M5442 Lumbago with sciatica, left side: Secondary | ICD-10-CM | POA: Diagnosis not present

## 2022-12-21 DIAGNOSIS — R293 Abnormal posture: Secondary | ICD-10-CM

## 2022-12-21 NOTE — Therapy (Signed)
OUTPATIENT PHYSICAL THERAPY THORACOLUMBAR TREATMENT   Patient Name: Cameron Todd MRN: [id number] DOB:12/04/1932, 89 y.o., male Today's Date: 12/21/2022  END OF SESSION:  PT End of Session - 12/21/22 1601     Visit Number 5    Date for PT Re-Evaluation 02/26/23    PT Start Time 1601    PT Stop Time 1645    PT Time Calculation (min) 44 min    Activity Tolerance Patient tolerated treatment well    Behavior During Therapy WFL for tasks assessed/performed             Past Medical History:  Diagnosis Date   Arthritis    Barrett's esophagus    Compression fracture of spine (HCC)    T 10   Diabetes (HCC)    Elevated cholesterol    Gastric ulcer 1990   Hemorrhoids    History of colon polyps    Hypertension    Tubular adenoma of colon    Past Surgical History:  Procedure Laterality Date   cataract surgery bilateral     NO PAST SURGERIES     UPPER GASTROINTESTINAL ENDOSCOPY     Patient Active Problem List   Diagnosis Date Noted   Age related osteoporosis 11/09/2020   Cold intolerance 11/09/2020   Polyneuropathy 11/09/2020   Vitamin D deficiency 11/09/2020   Dorsalgia 11/09/2020   Controlled type 2 diabetes mellitus without complication, without long-term current use of insulin (HCC) 11/09/2020   Hypertensive heart and renal disease 11/09/2020   Benign prostatic hyperplasia with urinary hesitancy 11/09/2020   Barrett's esophagus 05/20/2013   HTN (hypertension) 05/20/2013   GENERALIZED OSTEOARTHROSIS UNSPECIFIED SITE 06/10/2008   EPIGASTRIC PAIN 06/10/2008   GASTRIC ULCER, HX OF 06/10/2008   INTERNAL HEMORRHOIDS 06/08/2008    PCP: Nadene Rubins  REFERRING PROVIDER: Coletta Memos  REFERRING DIAG:  M54.42 (ICD-10-CM) - Lumbago with sciatica, left side    Rationale for Evaluation and Treatment: Rehabilitation  THERAPY DIAG:  Abnormal posture  Muscle weakness (generalized)  Left-sided low back pain with left-sided sciatica, unspecified chronicity  Other  low back pain  ONSET DATE: 11/27/22  SUBJECTIVE:                                                                                                                                                                                           SUBJECTIVE STATEMENT: "I woke up on the wrong side of the bed" "Feeling slow" PERTINENT HISTORY:  No surgical hx, arthritis   PAIN:  Are you having pain? Yes: NPRS scale: 6/10 Pain location: L hip down my leg, low back Pain description: achy, sharp, radiating down leg, constant  and never goes away, numbness Aggravating factors: standing, walking Relieving factors: just the medicine but only for a short time  PRECAUTIONS: None  WEIGHT BEARING RESTRICTIONS: No  FALLS:  Has patient fallen in last 6 months? No  LIVING ENVIRONMENT: Lives with: lives with their spouse Lives in: House/apartment Stairs: Yes: External: 3-4 steps; on left going up Has following equipment at home: None  OCCUPATION: Retired  PLOF: Independent and Independent with basic ADLs  PATIENT GOALS: some how get rid of the pain in my back and leg   NEXT MD VISIT: 12/11/22- spine doctor for injection  OBJECTIVE:   DIAGNOSTIC FINDINGS:  IMPRESSION: 1. Left lateral recess narrowing at L4-L5 and L5-S1, which may be a source of left L5 and/or S1 radiculopathy. 2. No central spinal canal or neural foraminal stenosis. 3. Chronic compression deformity of T11 with mild narrowing of the ventral thecal sac.   SCREENING FOR RED FLAGS: Bowel or bladder incontinence: No Spinal tumors: No Cauda equina syndrome: No Compression fracture: No Abdominal aneurysm: No  COGNITION: Overall cognitive status: Within functional limits for tasks assessed     SENSATION: WFL  MUSCLE LENGTH: Hamstrings: extremely tight in BLE  POSTURE: rounded shoulders, forward head, increased thoracic kyphosis, posterior pelvic tilt, and flexed trunk   PALPATION: TTP SIJ, L4-L5  LUMBAR ROM:   AROM eval  12/21/22  Flexion 75% pain in low back WFL  Extension 25% with pain Limited 50% with pain  Right lateral flexion Fib head  WFL  Left lateral flexion Fib head, some pain WFL with pain  Right rotation 25% with pain Limited 75%  Left rotation 25% with pain Limited 75%   (Blank rows = not tested)  LOWER EXTREMITY ROM:   grossly WFL    LOWER EXTREMITY MMT:  grossly 4-4+/5    LUMBAR SPECIAL TESTS:  Straight leg raise test: Positive and Slump test: Negative  FUNCTIONAL TESTS:  5 times sit to stand: 14.34s Timed up and go (TUG): 19.01s  GAIT: Distance walked: in clinic distances Assistive device utilized: None Level of assistance: Modified independence Comments: flexed trunk, decreased foot clearance, forward head, kyphotic posture, increased posterior pelvic tilt, slowed speed   TODAY'S TREATMENT:                                                                                                                              DATE:  12/21/22 NuStep L 5 x 7 min Bilat shoulder flex w/ lumbar Ext 2lb WaTE x10 Rows & Lats 20lb 2x10 Horiz Abd green 2x10  S2S OHP red ball 2x10  W back with over pressure 2x10 S2S OHP red ball 2x10  12/19/22 NuStep L 5 x 7 min Rows and Ext green 2x10 each S2S OHP red ball 2x10  Seated trunk rotation yellow ball  STM to para spinales of T spine Horiz abd green 2x10  Some over pressure on shoulder with pt in supine  Bridges    12/14/22 Dyna  disc pelvic mobility 4 way 10 x each On dyna disc green tband shld ext and row 2 sets 10.Horiz abd red 2x10 Passive HS, piriformis, K2C stretch, trunk rotation- very tight BIl Left > RT and Left side more painful LE on Pball bridges, K2C, Oblq Isometric abdominals  Supine 2# hand wts horz abd hold 5 sec 10 x to open up chest Nustep L 4 Passive HS, piriformis, ITB, K2C stretch   12/12/22 NuStep L4 x 6 min Bilat shoulder flex w/ lumbar Ext 2lb WaTE x10 Horiz abd red 2x10 Shoulder Ext green 2x10 Bridges  3x5 Passive HS, piriformis, K2C stretch LE on Pball bridges, K2C, Oblq   12/04/22- EVAL     PATIENT EDUCATION:  Education details: POC and HEP Person educated: Patient and Child(ren) Education method: Explanation Education comprehension: verbalized understanding  HOME EXERCISE PROGRAM: Access Code: [email or web address] URL: [email or web address] Date: 12/04/2022 Prepared by: Cassie Freer  Exercises - Supine Lower Trunk Rotation  - 1 x daily - 7 x weekly - 2 sets - 10 reps - Supine Bridge  - 1 x daily - 7 x weekly - 2 sets - 10 reps - Supine Single Knee to Chest Stretch  - 2 x daily - 7 x weekly - 30 hold - Supine Active Straight Leg Raise  - 1 x daily - 7 x weekly - 2 sets - 10 reps - Seated Hamstring Stretch  - 2 x daily - 7 x weekly - 30 hold  ASSESSMENT:  CLINICAL IMPRESSION: Patient is a 89 y.o. male who was seen today for physical therapy treatment for low back and L hip pain. He presents with increase thoracic kyphosis, and heavy posterior pelvic tilt. He has progressed slightly improving his lumbar ROM and well as his TUG time.  His posture is his biggest limiting factor and plays a role in his pain. Continued with postural strengthening interventions. He has a difficult time manipulating his alignment even with tactile cues. Progressed to [facility] level interventions. Limited trunk rotation with intervention.    OBJECTIVE IMPAIRMENTS: Abnormal gait, difficulty walking, decreased strength, improper body mechanics, postural dysfunction, and pain.   ACTIVITY LIMITATIONS: bending, sitting, standing, stairs, and locomotion level   REHAB POTENTIAL: Good  CLINICAL DECISION MAKING: Stable/uncomplicated  EVALUATION COMPLEXITY: Low  GOALS: Goals reviewed with patient? Yes  SHORT TERM GOALS: Target date: 01/15/23  Patient will be independent with initial HEP.  Goal status: Met 12/21/22  2.  Patient will report centralization of radicular symptoms.  Baseline: radiating  pain in to leg Goal status: on going  3.  Patient will demonstrate TUG < 15s  Baseline: 19.01  Goal status: 17.64 progressing 12/21/22    LONG TERM GOALS: Target date: 02/26/23  Patient will be independent with advanced/ongoing HEP to improve outcomes and carryover.  Goal status: INITIAL  2.  Patient will report 75% improvement in low back pain to improve QOL.  Baseline: 7/10 Goal status: INITIAL  3.  Patient will demonstrate full pain free lumbar ROM to perform ADLs.   Goal status: INITIAL   4.  Patient to demonstrate ability to achieve and maintain good spinal alignment/posturing and body mechanics needed for daily activities. Baseline: forward head, kyphosis, posterior pelvis tilt Goal status: INITIAL  PLAN:  PT FREQUENCY: 2x/week  PT DURATION: 12 weeks  PLANNED INTERVENTIONS: Therapeutic exercises, Therapeutic activity, Neuromuscular re-education, Balance training, Gait training, Patient/Family education, Self Care, Joint mobilization, Joint manipulation, Stair training, Dry Needling, Electrical stimulation, Spinal manipulation, Spinal mobilization, Cryotherapy, Moist heat, and  Manual therapy.  PLAN FOR NEXT SESSION: NuStep, postural exercises for upper back and pelvis   Grayce Sessions, PTA 12/21/2022, 4:01 PM [facility] [address] [place], [place], [address] Phone: [phone]   Fax:  [phone]

## 2022-12-25 ENCOUNTER — Ambulatory Visit: Payer: Medicare Other | Attending: Neurosurgery | Admitting: Physical Therapy

## 2022-12-25 ENCOUNTER — Ambulatory Visit: Payer: Medicare Other

## 2022-12-25 ENCOUNTER — Encounter: Payer: Self-pay | Admitting: Physical Therapy

## 2022-12-25 DIAGNOSIS — M5459 Other low back pain: Secondary | ICD-10-CM | POA: Insufficient documentation

## 2022-12-25 DIAGNOSIS — M5442 Lumbago with sciatica, left side: Secondary | ICD-10-CM | POA: Diagnosis not present

## 2022-12-25 DIAGNOSIS — M6281 Muscle weakness (generalized): Secondary | ICD-10-CM | POA: Insufficient documentation

## 2022-12-25 DIAGNOSIS — R293 Abnormal posture: Secondary | ICD-10-CM | POA: Insufficient documentation

## 2022-12-25 NOTE — Therapy (Signed)
OUTPATIENT PHYSICAL THERAPY THORACOLUMBAR TREATMENT   Patient Name: Cameron Todd MRN: [id number] DOB:1932/09/14, 89 y.o., male Today's Date: 12/25/2022  END OF SESSION:  PT End of Session - 12/25/22 1555     Visit Number 6    Date for PT Re-Evaluation 02/26/23    PT Start Time 1555    PT Stop Time 1640    PT Time Calculation (min) 45 min    Activity Tolerance Patient tolerated treatment well    Behavior During Therapy WFL for tasks assessed/performed             Past Medical History:  Diagnosis Date   Arthritis    Barrett's esophagus    Compression fracture of spine (HCC)    T 10   Diabetes (HCC)    Elevated cholesterol    Gastric ulcer 1990   Hemorrhoids    History of colon polyps    Hypertension    Tubular adenoma of colon    Past Surgical History:  Procedure Laterality Date   cataract surgery bilateral     NO PAST SURGERIES     UPPER GASTROINTESTINAL ENDOSCOPY     Patient Active Problem List   Diagnosis Date Noted   Age related osteoporosis 11/09/2020   Cold intolerance 11/09/2020   Polyneuropathy 11/09/2020   Vitamin D deficiency 11/09/2020   Dorsalgia 11/09/2020   Controlled type 2 diabetes mellitus without complication, without long-term current use of insulin (HCC) 11/09/2020   Hypertensive heart and renal disease 11/09/2020   Benign prostatic hyperplasia with urinary hesitancy 11/09/2020   Barrett's esophagus 05/20/2013   HTN (hypertension) 05/20/2013   GENERALIZED OSTEOARTHROSIS UNSPECIFIED SITE 06/10/2008   EPIGASTRIC PAIN 06/10/2008   GASTRIC ULCER, HX OF 06/10/2008   INTERNAL HEMORRHOIDS 06/08/2008    PCP: Nadene Rubins  REFERRING PROVIDER: Coletta Memos  REFERRING DIAG:  M54.42 (ICD-10-CM) - Lumbago with sciatica, left side    Rationale for Evaluation and Treatment: Rehabilitation  THERAPY DIAG:  Abnormal posture  Left-sided low back pain with left-sided sciatica, unspecified chronicity  Other low back pain  Muscle weakness  (generalized)  ONSET DATE: 11/27/22  SUBJECTIVE:                                                                                                                                                                                           SUBJECTIVE STATEMENT: Dong ok still having pain that goes down the L leg PERTINENT HISTORY:  No surgical hx, arthritis   PAIN:  Are you having pain? Yes: NPRS scale: 6/10 Pain location: L hip down my leg, low back Pain description: achy, sharp, radiating down leg, constant and  never goes away, numbness Aggravating factors: standing, walking Relieving factors: just the medicine but only for a short time  PRECAUTIONS: None  WEIGHT BEARING RESTRICTIONS: No  FALLS:  Has patient fallen in last 6 months? No  LIVING ENVIRONMENT: Lives with: lives with their spouse Lives in: House/apartment Stairs: Yes: External: 3-4 steps; on left going up Has following equipment at home: None  OCCUPATION: Retired  PLOF: Independent and Independent with basic ADLs  PATIENT GOALS: some how get rid of the pain in my back and leg   NEXT MD VISIT: 12/11/22- spine doctor for injection  OBJECTIVE:   DIAGNOSTIC FINDINGS:  IMPRESSION: 1. Left lateral recess narrowing at L4-L5 and L5-S1, which may be a source of left L5 and/or S1 radiculopathy. 2. No central spinal canal or neural foraminal stenosis. 3. Chronic compression deformity of T11 with mild narrowing of the ventral thecal sac.   SCREENING FOR RED FLAGS: Bowel or bladder incontinence: No Spinal tumors: No Cauda equina syndrome: No Compression fracture: No Abdominal aneurysm: No  COGNITION: Overall cognitive status: Within functional limits for tasks assessed     SENSATION: WFL  MUSCLE LENGTH: Hamstrings: extremely tight in BLE  POSTURE: rounded shoulders, forward head, increased thoracic kyphosis, posterior pelvic tilt, and flexed trunk   PALPATION: TTP SIJ, L4-L5  LUMBAR ROM:   AROM eval  12/21/22  Flexion 75% pain in low back WFL  Extension 25% with pain Limited 50% with pain  Right lateral flexion Fib head  WFL  Left lateral flexion Fib head, some pain WFL with pain  Right rotation 25% with pain Limited 75%  Left rotation 25% with pain Limited 75%   (Blank rows = not tested)  LOWER EXTREMITY ROM:   grossly WFL    LOWER EXTREMITY MMT:  grossly 4-4+/5    LUMBAR SPECIAL TESTS:  Straight leg raise test: Positive and Slump test: Negative  FUNCTIONAL TESTS:  5 times sit to stand: 14.34s Timed up and go (TUG): 19.01s  GAIT: Distance walked: in clinic distances Assistive device utilized: None Level of assistance: Modified independence Comments: flexed trunk, decreased foot clearance, forward head, kyphotic posture, increased posterior pelvic tilt, slowed speed   TODAY'S TREATMENT:                                                                                                                              DATE:  12/25/22 NuStep L 5 x 7 min Shoulder Ext 5lb 2x10 Horiz abd 2x10  MHP to lumbar spine   Bridges   LE on pball, Oblq, K2C, Bridges  Stretching HS, Piriformis, ITB, lower trnk rotations Bilat shoulder flex w/ lumbar Ext 2lb WaTE 2x10  12/21/22 NuStep L 5 x 7 min Bilat shoulder flex w/ lumbar Ext 2lb WaTE x10 Rows & Lats 20lb 2x10 Horiz Abd green 2x10  S2S OHP red ball 2x10  W back with over pressure 2x10 S2S OHP red ball 2x10  12/19/22 NuStep L 5 x 7  min Rows and Ext green 2x10 each S2S OHP red ball 2x10  Seated trunk rotation yellow ball  STM to para spinales of T spine Horiz abd green 2x10  Some over pressure on shoulder with pt in supine  Bridges    12/14/22 Dyna disc pelvic mobility 4 way 10 x each On dyna disc green tband shld ext and row 2 sets 10.Horiz abd red 2x10 Passive HS, piriformis, K2C stretch, trunk rotation- very tight BIl Left > RT and Left side more painful LE on Pball bridges, K2C, Oblq Isometric abdominals  Supine 2# hand  wts horz abd hold 5 sec 10 x to open up chest Nustep L 4 Passive HS, piriformis, ITB, K2C stretch    PATIENT EDUCATION:  Education details: POC and HEP Person educated: Patient and Child(ren) Education method: Explanation Education comprehension: verbalized understanding  HOME EXERCISE PROGRAM: Access Code: [email or web address] URL: [email or web address] Date: 12/04/2022 Prepared by: Cassie Freer  Exercises - Supine Lower Trunk Rotation  - 1 x daily - 7 x weekly - 2 sets - 10 reps - Supine Bridge  - 1 x daily - 7 x weekly - 2 sets - 10 reps - Supine Single Knee to Chest Stretch  - 2 x daily - 7 x weekly - 30 hold - Supine Active Straight Leg Raise  - 1 x daily - 7 x weekly - 2 sets - 10 reps - Seated Hamstring Stretch  - 2 x daily - 7 x weekly - 30 hold  ASSESSMENT:  CLINICAL IMPRESSION: Patient is a 89 y.o. male who was seen today for physical therapy treatment for low back and L hip pain. He presents with increase thoracic kyphosis, and heavy posterior pelvic tilt. He has progressed slightly improving his lumbar ROM and well as his TUG time.  His posture is his biggest limiting factor and plays a role in his pain. Continued with postural strengthening interventions. He has a difficult time manipulating his alignment even with tactile cues. Pt felt like the heat made his back feel better. Postural cue needed with shoulder ext and horizontal abduction.    OBJECTIVE IMPAIRMENTS: Abnormal gait, difficulty walking, decreased strength, improper body mechanics, postural dysfunction, and pain.   ACTIVITY LIMITATIONS: bending, sitting, standing, stairs, and locomotion level   REHAB POTENTIAL: Good  CLINICAL DECISION MAKING: Stable/uncomplicated  EVALUATION COMPLEXITY: Low  GOALS: Goals reviewed with patient? Yes  SHORT TERM GOALS: Target date: 01/15/23  Patient will be independent with initial HEP.  Goal status: Met 12/21/22  2.  Patient will report centralization of  radicular symptoms.  Baseline: radiating pain in to leg Goal status: on going  3.  Patient will demonstrate TUG < 15s  Baseline: 19.01  Goal status: 17.64 progressing 12/21/22    LONG TERM GOALS: Target date: 02/26/23  Patient will be independent with advanced/ongoing HEP to improve outcomes and carryover.  Goal status: INITIAL  2.  Patient will report 75% improvement in low back pain to improve QOL.  Baseline: 7/10 Goal status: INITIAL  3.  Patient will demonstrate full pain free lumbar ROM to perform ADLs.   Goal status: INITIAL   4.  Patient to demonstrate ability to achieve and maintain good spinal alignment/posturing and body mechanics needed for daily activities. Baseline: forward head, kyphosis, posterior pelvis tilt Goal status: INITIAL  PLAN:  PT FREQUENCY: 2x/week  PT DURATION: 12 weeks  PLANNED INTERVENTIONS: Therapeutic exercises, Therapeutic activity, Neuromuscular re-education, Balance training, Gait training, Patient/Family education, Self Care, Joint mobilization, Joint manipulation, Stair  training, Dry Needling, Electrical stimulation, Spinal manipulation, Spinal mobilization, Cryotherapy, Moist heat, and Manual therapy.  PLAN FOR NEXT SESSION: NuStep, postural exercises for upper back and pelvis   Grayce Sessions, PTA 12/25/2022, 3:56 PM [facility] at [facility] [address] [facility]. [place], [place], [address] Phone: [phone]   Fax:  [phone]

## 2022-12-26 DIAGNOSIS — R2681 Unsteadiness on feet: Secondary | ICD-10-CM | POA: Diagnosis not present

## 2022-12-26 DIAGNOSIS — L89302 Pressure ulcer of unspecified buttock, stage 2: Secondary | ICD-10-CM | POA: Diagnosis not present

## 2022-12-26 DIAGNOSIS — M5417 Radiculopathy, lumbosacral region: Secondary | ICD-10-CM | POA: Diagnosis not present

## 2022-12-26 DIAGNOSIS — I10 Essential (primary) hypertension: Secondary | ICD-10-CM | POA: Diagnosis not present

## 2022-12-26 DIAGNOSIS — E119 Type 2 diabetes mellitus without complications: Secondary | ICD-10-CM | POA: Diagnosis not present

## 2022-12-28 ENCOUNTER — Ambulatory Visit: Payer: Medicare Other | Admitting: Physical Therapy

## 2022-12-28 ENCOUNTER — Ambulatory Visit: Payer: Medicare Other

## 2022-12-28 ENCOUNTER — Encounter: Payer: Self-pay | Admitting: Physical Therapy

## 2022-12-28 DIAGNOSIS — M6281 Muscle weakness (generalized): Secondary | ICD-10-CM | POA: Diagnosis not present

## 2022-12-28 DIAGNOSIS — R293 Abnormal posture: Secondary | ICD-10-CM | POA: Diagnosis not present

## 2022-12-28 DIAGNOSIS — M5442 Lumbago with sciatica, left side: Secondary | ICD-10-CM

## 2022-12-28 DIAGNOSIS — M5459 Other low back pain: Secondary | ICD-10-CM

## 2022-12-28 NOTE — Therapy (Signed)
OUTPATIENT PHYSICAL THERAPY THORACOLUMBAR TREATMENT   Patient Name: Cameron Todd MRN: [id number] DOB:[birth year], 89 y.o., male Today's Date: 12/28/2022  END OF SESSION:  PT End of Session - 12/28/22 1557     Visit Number 7    Date for PT Re-Evaluation 02/26/23    PT Start Time 1600    PT Stop Time 1645    PT Time Calculation (min) 45 min    Activity Tolerance Patient tolerated treatment well    Behavior During Therapy [facility] for tasks assessed/performed             Past Medical History:  Diagnosis Date   Arthritis    Barrett's esophagus    Compression fracture of spine (HCC)    T 10   Diabetes (HCC)    Elevated cholesterol    Gastric ulcer 1990   Hemorrhoids    History of colon polyps    Hypertension    Tubular adenoma of colon    Past Surgical History:  Procedure Laterality Date   cataract surgery bilateral     NO PAST SURGERIES     UPPER GASTROINTESTINAL ENDOSCOPY     Patient Active Problem List   Diagnosis Date Noted   Age related osteoporosis 11/09/2020   Cold intolerance 11/09/2020   Polyneuropathy 11/09/2020   Vitamin D deficiency 11/09/2020   Dorsalgia 11/09/2020   Controlled type 2 diabetes mellitus without complication, without long-term current use of insulin (HCC) 11/09/2020   Hypertensive heart and renal disease 11/09/2020   Benign prostatic hyperplasia with urinary hesitancy 11/09/2020   Barrett's esophagus 05/20/2013   HTN (hypertension) 05/20/2013   GENERALIZED OSTEOARTHROSIS UNSPECIFIED SITE 06/10/2008   EPIGASTRIC PAIN 06/10/2008   GASTRIC ULCER, HX OF 06/10/2008   INTERNAL HEMORRHOIDS 06/08/2008    PCP: Nadene Rubins  REFERRING PROVIDER: Coletta Memos  REFERRING DIAG:  M54.42 (ICD-10-CM) - Lumbago with sciatica, left side    Rationale for Evaluation and Treatment: Rehabilitation  THERAPY DIAG:  Abnormal posture  Left-sided low back pain with left-sided sciatica, unspecified chronicity  Other low back pain  Muscle weakness  (generalized)  ONSET DATE: 11/27/22  SUBJECTIVE:                                                                                                                                                                                           SUBJECTIVE STATEMENT: "Moving slow" Some pain down the L leg PERTINENT HISTORY:  No surgical hx, arthritis   PAIN:  Are you having pain? Yes: NPRS scale: 6/10 Pain location: L hip down my leg, low back Pain description: achy, sharp, radiating down leg, constant and never goes away,  numbness Aggravating factors: standing, walking Relieving factors: just the medicine but only for a short time  PRECAUTIONS: None  WEIGHT BEARING RESTRICTIONS: No  FALLS:  Has patient fallen in last 6 months? No  LIVING ENVIRONMENT: Lives with: lives with their spouse Lives in: House/apartment Stairs: Yes: External: 3-4 steps; on left going up Has following equipment at home: None  OCCUPATION: Retired  PLOF: Independent and Independent with basic ADLs  PATIENT GOALS: some how get rid of the pain in my back and leg   NEXT MD VISIT: 12/11/22- spine doctor for injection  OBJECTIVE:   DIAGNOSTIC FINDINGS:  IMPRESSION: 1. Left lateral recess narrowing at L4-L5 and L5-S1, which may be a source of left L5 and/or S1 radiculopathy. 2. No central spinal canal or neural foraminal stenosis. 3. Chronic compression deformity of T11 with mild narrowing of the ventral thecal sac.   SCREENING FOR RED FLAGS: Bowel or bladder incontinence: No Spinal tumors: No Cauda equina syndrome: No Compression fracture: No Abdominal aneurysm: No  COGNITION: Overall cognitive status: Within functional limits for tasks assessed     SENSATION: WFL  MUSCLE LENGTH: Hamstrings: extremely tight in BLE  POSTURE: rounded shoulders, forward head, increased thoracic kyphosis, posterior pelvic tilt, and flexed trunk   PALPATION: TTP SIJ, L4-L5  LUMBAR ROM:   AROM eval 12/21/22   Flexion 75% pain in low back WFL  Extension 25% with pain Limited 50% with pain  Right lateral flexion Fib head  WFL  Left lateral flexion Fib head, some pain WFL with pain  Right rotation 25% with pain Limited 75%  Left rotation 25% with pain Limited 75%   (Blank rows = not tested)  LOWER EXTREMITY ROM:   grossly WFL    LOWER EXTREMITY MMT:  grossly 4-4+/5    LUMBAR SPECIAL TESTS:  Straight leg raise test: Positive and Slump test: Negative  FUNCTIONAL TESTS:  5 times sit to stand: 14.34s Timed up and go (TUG): 19.01s  GAIT: Distance walked: in clinic distances Assistive device utilized: None Level of assistance: Modified independence Comments: flexed trunk, decreased foot clearance, forward head, kyphotic posture, increased posterior pelvic tilt, slowed speed   TODAY'S TREATMENT:                                                                                                                              DATE:  12/28/22 NuStep L 5 x 6 min UBE L1 x 3 min Shoulder Ext 5lb 2x12 HS curls 25lb 2x10 Leg Ext 5lb 2x10 Horiz abd red 2x12  S2S OHP red ball 2x10 MHP to lumbar spine   bridges  Stretching HS, Piriformis, ITB, lower trnk rotations  12/25/22 NuStep L 5 x 7 min Shoulder Ext 5lb 2x10 Horiz abd 2x10  MHP to lumbar spine   Bridges   LE on pball, Oblq, K2C, Bridges  Stretching HS, Piriformis, ITB, lower trnk rotations Bilat shoulder flex w/ lumbar Ext 2lb WaTE 2x10  12/21/22 NuStep L  5 x 7 min Bilat shoulder flex w/ lumbar Ext 2lb WaTE x10 Rows & Lats 20lb 2x10 Horiz Abd green 2x10  S2S OHP red ball 2x10  W back with over pressure 2x10 S2S OHP red ball 2x10  12/19/22 NuStep L 5 x 7 min Rows and Ext green 2x10 each S2S OHP red ball 2x10  Seated trunk rotation yellow ball  STM to para spinales of T spine Horiz abd green 2x10  Some over pressure on shoulder with pt in supine  Bridges    12/14/22 Dyna disc pelvic mobility 4 way 10 x each On dyna disc green  tband shld ext and row 2 sets 10.Horiz abd red 2x10 Passive HS, piriformis, K2C stretch, trunk rotation- very tight BIl Left > RT and Left side more painful LE on Pball bridges, K2C, Oblq Isometric abdominals  Supine 2# hand wts horz abd hold 5 sec 10 x to open up chest Nustep L 4 Passive HS, piriformis, ITB, K2C stretch    PATIENT EDUCATION:  Education details: POC and HEP Person educated: Patient and Child(ren) Education method: Explanation Education comprehension: verbalized understanding  HOME EXERCISE PROGRAM: Access Code: [email or web address] URL: [email or web address] Date: 12/04/2022 Prepared by: Cassie Freer  Exercises - Supine Lower Trunk Rotation  - 1 x daily - 7 x weekly - 2 sets - 10 reps - Supine Bridge  - 1 x daily - 7 x weekly - 2 sets - 10 reps - Supine Single Knee to Chest Stretch  - 2 x daily - 7 x weekly - 30 hold - Supine Active Straight Leg Raise  - 1 x daily - 7 x weekly - 2 sets - 10 reps - Seated Hamstring Stretch  - 2 x daily - 7 x weekly - 30 hold  ASSESSMENT:  CLINICAL IMPRESSION: Patient is a 89 y.o. male who was seen today for physical therapy treatment for low back and L hip pain. He presents with increase thoracic kyphosis, and heavy posterior pelvic tilt.  His posture is his biggest limiting factor and plays a role in his pain. Continued with postural strengthening interventions. He has a difficult time manipulating his alignment even with tactile cues. Pt felt like the heat made his back feel better so that was continues. Postural cue needed with shoulder ext and horizontal abduction. Some instability with sit to stands due to postural alignment. Bilateral tight HS and piriformis   OBJECTIVE IMPAIRMENTS: Abnormal gait, difficulty walking, decreased strength, improper body mechanics, postural dysfunction, and pain.   ACTIVITY LIMITATIONS: bending, sitting, standing, stairs, and locomotion level   REHAB POTENTIAL: Good  CLINICAL DECISION  MAKING: Stable/uncomplicated  EVALUATION COMPLEXITY: Low  GOALS: Goals reviewed with patient? Yes  SHORT TERM GOALS: Target date: 01/15/23  Patient will be independent with initial HEP.  Goal status: Met 12/21/22  2.  Patient will report centralization of radicular symptoms.  Baseline: radiating pain in to leg Goal status: on going  3.  Patient will demonstrate TUG < 15s  Baseline: 19.01  Goal status: 17.64 progressing 12/21/22    LONG TERM GOALS: Target date: 02/26/23  Patient will be independent with advanced/ongoing HEP to improve outcomes and carryover.  Goal status: INITIAL  2.  Patient will report 75% improvement in low back pain to improve QOL.  Baseline: 7/10 Goal status: On going  3.  Patient will demonstrate full pain free lumbar ROM to perform ADLs.   Goal status: On going   4.  Patient to demonstrate ability to achieve and maintain  good spinal alignment/posturing and body mechanics needed for daily activities. Baseline: forward head, kyphosis, posterior pelvis tilt Goal status: on going  PLAN:  PT FREQUENCY: 2x/week  PT DURATION: 12 weeks  PLANNED INTERVENTIONS: Therapeutic exercises, Therapeutic activity, Neuromuscular re-education, Balance training, Gait training, Patient/Family education, Self Care, Joint mobilization, Joint manipulation, Stair training, Dry Needling, Electrical stimulation, Spinal manipulation, Spinal mobilization, Cryotherapy, Moist heat, and Manual therapy.  PLAN FOR NEXT SESSION: NuStep, postural exercises for upper back and pelvis   Grayce Sessions, PTA

## 2023-01-01 DIAGNOSIS — M5416 Radiculopathy, lumbar region: Secondary | ICD-10-CM | POA: Diagnosis not present

## 2023-01-01 DIAGNOSIS — Z6827 Body mass index (BMI) 27.0-27.9, adult: Secondary | ICD-10-CM | POA: Diagnosis not present

## 2023-01-02 ENCOUNTER — Ambulatory Visit: Payer: Medicare Other | Admitting: Physical Therapy

## 2023-01-02 ENCOUNTER — Encounter: Payer: Self-pay | Admitting: Physical Therapy

## 2023-01-02 ENCOUNTER — Ambulatory Visit: Payer: Medicare Other

## 2023-01-02 DIAGNOSIS — M5459 Other low back pain: Secondary | ICD-10-CM | POA: Diagnosis not present

## 2023-01-02 DIAGNOSIS — M6281 Muscle weakness (generalized): Secondary | ICD-10-CM

## 2023-01-02 DIAGNOSIS — M5442 Lumbago with sciatica, left side: Secondary | ICD-10-CM

## 2023-01-02 DIAGNOSIS — R293 Abnormal posture: Secondary | ICD-10-CM

## 2023-01-02 NOTE — Therapy (Signed)
OUTPATIENT PHYSICAL THERAPY THORACOLUMBAR TREATMENT   Patient Name: Cameron Todd MRN: [id number] DOB:10/14/1932, 89 y.o., male Today's Date: 01/02/2023  END OF SESSION:  PT End of Session - 01/02/23 1602     Visit Number 8    Date for PT Re-Evaluation 02/26/23    PT Start Time 1600    PT Stop Time 1645    PT Time Calculation (min) 45 min    Activity Tolerance Patient tolerated treatment well    Behavior During Therapy WFL for tasks assessed/performed             Past Medical History:  Diagnosis Date   Arthritis    Barrett's esophagus    Compression fracture of spine (HCC)    T 10   Diabetes (HCC)    Elevated cholesterol    Gastric ulcer 1990   Hemorrhoids    History of colon polyps    Hypertension    Tubular adenoma of colon    Past Surgical History:  Procedure Laterality Date   cataract surgery bilateral     NO PAST SURGERIES     UPPER GASTROINTESTINAL ENDOSCOPY     Patient Active Problem List   Diagnosis Date Noted   Age related osteoporosis 11/09/2020   Cold intolerance 11/09/2020   Polyneuropathy 11/09/2020   Vitamin D deficiency 11/09/2020   Dorsalgia 11/09/2020   Controlled type 2 diabetes mellitus without complication, without long-term current use of insulin (HCC) 11/09/2020   Hypertensive heart and renal disease 11/09/2020   Benign prostatic hyperplasia with urinary hesitancy 11/09/2020   Barrett's esophagus 05/20/2013   HTN (hypertension) 05/20/2013   GENERALIZED OSTEOARTHROSIS UNSPECIFIED SITE 06/10/2008   EPIGASTRIC PAIN 06/10/2008   GASTRIC ULCER, HX OF 06/10/2008   INTERNAL HEMORRHOIDS 06/08/2008    PCP: Nadene Rubins  REFERRING PROVIDER: Coletta Memos  REFERRING DIAG:  M54.42 (ICD-10-CM) - Lumbago with sciatica, left side    Rationale for Evaluation and Treatment: Rehabilitation  THERAPY DIAG:  Abnormal posture  Left-sided low back pain with left-sided sciatica, unspecified chronicity  Other low back pain  Muscle weakness  (generalized)  ONSET DATE: 11/27/22  SUBJECTIVE:                                                                                                                                                                                           SUBJECTIVE STATEMENT: Ok, The leg hurts PERTINENT HISTORY:  No surgical hx, arthritis   PAIN:  Are you having pain? Yes: NPRS scale: 6/10 Pain location: L hip down my leg, low back Pain description: achy, sharp, radiating down leg, constant and never goes away, numbness Aggravating factors: standing,  walking Relieving factors: just the medicine but only for a short time  PRECAUTIONS: None  WEIGHT BEARING RESTRICTIONS: No  FALLS:  Has patient fallen in last 6 months? No  LIVING ENVIRONMENT: Lives with: lives with their spouse Lives in: House/apartment Stairs: Yes: External: 3-4 steps; on left going up Has following equipment at home: None  OCCUPATION: Retired  PLOF: Independent and Independent with basic ADLs  PATIENT GOALS: some how get rid of the pain in my back and leg   NEXT MD VISIT: 12/11/22- spine doctor for injection  OBJECTIVE:   DIAGNOSTIC FINDINGS:  IMPRESSION: 1. Left lateral recess narrowing at L4-L5 and L5-S1, which may be a source of left L5 and/or S1 radiculopathy. 2. No central spinal canal or neural foraminal stenosis. 3. Chronic compression deformity of T11 with mild narrowing of the ventral thecal sac.   SCREENING FOR RED FLAGS: Bowel or bladder incontinence: No Spinal tumors: No Cauda equina syndrome: No Compression fracture: No Abdominal aneurysm: No  COGNITION: Overall cognitive status: Within functional limits for tasks assessed     SENSATION: WFL  MUSCLE LENGTH: Hamstrings: extremely tight in BLE  POSTURE: rounded shoulders, forward head, increased thoracic kyphosis, posterior pelvic tilt, and flexed trunk   PALPATION: TTP SIJ, L4-L5  LUMBAR ROM:   AROM eval 12/21/22  Flexion 75% pain in low  back WFL  Extension 25% with pain Limited 50% with pain  Right lateral flexion Fib head  WFL  Left lateral flexion Fib head, some pain WFL with pain  Right rotation 25% with pain Limited 75%  Left rotation 25% with pain Limited 75%   (Blank rows = not tested)  LOWER EXTREMITY ROM:   grossly WFL    LOWER EXTREMITY MMT:  grossly 4-4+/5    LUMBAR SPECIAL TESTS:  Straight leg raise test: Positive and Slump test: Negative  FUNCTIONAL TESTS:  5 times sit to stand: 14.34s Timed up and go (TUG): 19.01s  GAIT: Distance walked: in clinic distances Assistive device utilized: None Level of assistance: Modified independence Comments: flexed trunk, decreased foot clearance, forward head, kyphotic posture, increased posterior pelvic tilt, slowed speed   TODAY'S TREATMENT:                                                                                                                              DATE:  01/02/23 NuStep L 5 x 6 min UBE L1 x2 min each W back with over pressure x10 Rows & Lats 25lb 2x10  Lumbar Ext Black band 2x10 Shoulder Ext green 2x15 Leg Ext 5lb 2x10 MHP to lumbar spine and R shoulder x10 min  12/28/22 NuStep L 5 x 6 min UBE L1 x 3 min Shoulder Ext 5lb 2x12 HS curls 25lb 2x10 Leg Ext 5lb 2x10 Horiz abd red 2x12  S2S OHP red ball 2x10 MHP to lumbar spine   bridges  Stretching HS, Piriformis, ITB, lower trnk rotations  12/25/22 NuStep L 5 x 7 min Shoulder  Ext 5lb 2x10 Horiz abd 2x10  MHP to lumbar spine   Bridges   LE on pball, Oblq, K2C, Bridges  Stretching HS, Piriformis, ITB, lower trnk rotations Bilat shoulder flex w/ lumbar Ext 2lb WaTE 2x10  12/21/22 NuStep L 5 x 7 min Bilat shoulder flex w/ lumbar Ext 2lb WaTE x10 Rows & Lats 20lb 2x10 Horiz Abd green 2x10  S2S OHP red ball 2x10  W back with over pressure 2x10 S2S OHP red ball 2x10  12/19/22 NuStep L 5 x 7 min Rows and Ext green 2x10 each S2S OHP red ball 2x10  Seated trunk rotation yellow ball   STM to para spinales of T spine Horiz abd green 2x10  Some over pressure on shoulder with pt in supine  Bridges    12/14/22 Dyna disc pelvic mobility 4 way 10 x each On dyna disc green tband shld ext and row 2 sets 10.Horiz abd red 2x10 Passive HS, piriformis, K2C stretch, trunk rotation- very tight BIl Left > RT and Left side more painful LE on Pball bridges, K2C, Oblq Isometric abdominals  Supine 2# hand wts horz abd hold 5 sec 10 x to open up chest Nustep L 4 Passive HS, piriformis, ITB, K2C stretch    PATIENT EDUCATION:  Education details: POC and HEP Person educated: Patient and Child(ren) Education method: Explanation Education comprehension: verbalized understanding  HOME EXERCISE PROGRAM: Access Code: [email or web address] URL: [email or web address] Date: 12/04/2022 Prepared by: Cassie Freer  Exercises - Supine Lower Trunk Rotation  - 1 x daily - 7 x weekly - 2 sets - 10 reps - Supine Bridge  - 1 x daily - 7 x weekly - 2 sets - 10 reps - Supine Single Knee to Chest Stretch  - 2 x daily - 7 x weekly - 30 hold - Supine Active Straight Leg Raise  - 1 x daily - 7 x weekly - 2 sets - 10 reps - Seated Hamstring Stretch  - 2 x daily - 7 x weekly - 30 hold  ASSESSMENT:  CLINICAL IMPRESSION: Patient is a 89 y.o. male who was seen today for physical therapy treatment for low back and L hip pain. He presents with increase thoracic kyphosis, and heavy posterior pelvic tilt.  His posture is his biggest limiting factor and plays a role in his pain. Continued with postural strengthening interventions. He has a difficult time manipulating his alignment even with tactile cues. Pt felt like the heat made his back feel better so that was continues. Postural cue needed with shoulder ext. Patient reported discomfort with W backs. Cue for core engagement needed with seated rows.   OBJECTIVE IMPAIRMENTS: Abnormal gait, difficulty walking, decreased strength, improper body  mechanics, postural dysfunction, and pain.   ACTIVITY LIMITATIONS: bending, sitting, standing, stairs, and locomotion level   REHAB POTENTIAL: Good  CLINICAL DECISION MAKING: Stable/uncomplicated  EVALUATION COMPLEXITY: Low  GOALS: Goals reviewed with patient? Yes  SHORT TERM GOALS: Target date: 01/15/23  Patient will be independent with initial HEP.  Goal status: Met 12/21/22  2.  Patient will report centralization of radicular symptoms.  Baseline: radiating pain in to leg Goal status: on going  3.  Patient will demonstrate TUG < 15s  Baseline: 19.01  Goal status: 17.64 progressing 12/21/22    LONG TERM GOALS: Target date: 02/26/23  Patient will be independent with advanced/ongoing HEP to improve outcomes and carryover.  Goal status: INITIAL  2.  Patient will report 75% improvement in low back pain to improve QOL.  Baseline: 7/10 Goal status: On going  3.  Patient will demonstrate full pain free lumbar ROM to perform ADLs.   Goal status: On going   4.  Patient to demonstrate ability to achieve and maintain good spinal alignment/posturing and body mechanics needed for daily activities. Baseline: forward head, kyphosis, posterior pelvis tilt Goal status: on going  PLAN:  PT FREQUENCY: 2x/week  PT DURATION: 12 weeks  PLANNED INTERVENTIONS: Therapeutic exercises, Therapeutic activity, Neuromuscular re-education, Balance training, Gait training, Patient/Family education, Self Care, Joint mobilization, Joint manipulation, Stair training, Dry Needling, Electrical stimulation, Spinal manipulation, Spinal mobilization, Cryotherapy, Moist heat, and Manual therapy.  PLAN FOR NEXT SESSION: NuStep, postural exercises for upper back and pelvis   Grayce Sessions, PTA

## 2023-01-04 ENCOUNTER — Ambulatory Visit: Payer: Medicare Other | Admitting: Physical Therapy

## 2023-01-04 ENCOUNTER — Encounter: Payer: Self-pay | Admitting: Physical Therapy

## 2023-01-04 ENCOUNTER — Ambulatory Visit: Payer: Medicare Other

## 2023-01-04 DIAGNOSIS — M5442 Lumbago with sciatica, left side: Secondary | ICD-10-CM | POA: Diagnosis not present

## 2023-01-04 DIAGNOSIS — M5459 Other low back pain: Secondary | ICD-10-CM

## 2023-01-04 DIAGNOSIS — R293 Abnormal posture: Secondary | ICD-10-CM

## 2023-01-04 DIAGNOSIS — M6281 Muscle weakness (generalized): Secondary | ICD-10-CM

## 2023-01-04 NOTE — Therapy (Signed)
OUTPATIENT PHYSICAL THERAPY THORACOLUMBAR TREATMENT   Patient Name: Cameron Todd MRN: [id number] DOB:1932-10-11, 89 y.o., male Today's Date: 01/04/2023  END OF SESSION:  PT End of Session - 01/04/23 1346     Visit Number 9    Date for PT Re-Evaluation 02/26/23    PT Start Time 1347    PT Stop Time 1430    PT Time Calculation (min) 43 min    Activity Tolerance Patient tolerated treatment well    Behavior During Therapy WFL for tasks assessed/performed             Past Medical History:  Diagnosis Date   Arthritis    Barrett's esophagus    Compression fracture of spine (HCC)    T 10   Diabetes (HCC)    Elevated cholesterol    Gastric ulcer 1990   Hemorrhoids    History of colon polyps    Hypertension    Tubular adenoma of colon    Past Surgical History:  Procedure Laterality Date   cataract surgery bilateral     NO PAST SURGERIES     UPPER GASTROINTESTINAL ENDOSCOPY     Patient Active Problem List   Diagnosis Date Noted   Age related osteoporosis 11/09/2020   Cold intolerance 11/09/2020   Polyneuropathy 11/09/2020   Vitamin D deficiency 11/09/2020   Dorsalgia 11/09/2020   Controlled type 2 diabetes mellitus without complication, without long-term current use of insulin (HCC) 11/09/2020   Hypertensive heart and renal disease 11/09/2020   Benign prostatic hyperplasia with urinary hesitancy 11/09/2020   Barrett's esophagus 05/20/2013   HTN (hypertension) 05/20/2013   GENERALIZED OSTEOARTHROSIS UNSPECIFIED SITE 06/10/2008   EPIGASTRIC PAIN 06/10/2008   GASTRIC ULCER, HX OF 06/10/2008   INTERNAL HEMORRHOIDS 06/08/2008    PCP: Nadene Rubins  REFERRING PROVIDER: Coletta Memos  REFERRING DIAG:  M54.42 (ICD-10-CM) - Lumbago with sciatica, left side    Rationale for Evaluation and Treatment: Rehabilitation  THERAPY DIAG:  Abnormal posture  Left-sided low back pain with left-sided sciatica, unspecified chronicity  Other low back pain  Muscle weakness  (generalized)  ONSET DATE: 11/27/22  SUBJECTIVE:                                                                                                                                                                                           SUBJECTIVE STATEMENT: "Just Slow" PERTINENT HISTORY:  No surgical hx, arthritis   PAIN:  Are you having pain? Yes: NPRS scale: 5-6/10 Pain location: L hip down my leg, low back Pain description: achy, sharp, radiating down leg, constant and never goes away, numbness Aggravating factors: standing, walking Relieving  factors: just the medicine but only for a short time  PRECAUTIONS: None  WEIGHT BEARING RESTRICTIONS: No  FALLS:  Has patient fallen in last 6 months? No  LIVING ENVIRONMENT: Lives with: lives with their spouse Lives in: House/apartment Stairs: Yes: External: 3-4 steps; on left going up Has following equipment at home: None  OCCUPATION: Retired  PLOF: Independent and Independent with basic ADLs  PATIENT GOALS: some how get rid of the pain in my back and leg   NEXT MD VISIT: 12/11/22- spine doctor for injection  OBJECTIVE:   DIAGNOSTIC FINDINGS:  IMPRESSION: 1. Left lateral recess narrowing at L4-L5 and L5-S1, which may be a source of left L5 and/or S1 radiculopathy. 2. No central spinal canal or neural foraminal stenosis. 3. Chronic compression deformity of T11 with mild narrowing of the ventral thecal sac.   SCREENING FOR RED FLAGS: Bowel or bladder incontinence: No Spinal tumors: No Cauda equina syndrome: No Compression fracture: No Abdominal aneurysm: No  COGNITION: Overall cognitive status: Within functional limits for tasks assessed     SENSATION: WFL  MUSCLE LENGTH: Hamstrings: extremely tight in BLE  POSTURE: rounded shoulders, forward head, increased thoracic kyphosis, posterior pelvic tilt, and flexed trunk   PALPATION: TTP SIJ, L4-L5  LUMBAR ROM:   AROM eval 12/21/22  Flexion 75% pain in low back  WFL  Extension 25% with pain Limited 50% with pain  Right lateral flexion Fib head  WFL  Left lateral flexion Fib head, some pain WFL with pain  Right rotation 25% with pain Limited 75%  Left rotation 25% with pain Limited 75%   (Blank rows = not tested)  LOWER EXTREMITY ROM:   grossly WFL    LOWER EXTREMITY MMT:  grossly 4-4+/5    LUMBAR SPECIAL TESTS:  Straight leg raise test: Positive and Slump test: Negative  FUNCTIONAL TESTS:  5 times sit to stand: 14.34s Timed up and go (TUG): 19.01s  GAIT: Distance walked: in clinic distances Assistive device utilized: None Level of assistance: Modified independence Comments: flexed trunk, decreased foot clearance, forward head, kyphotic posture, increased posterior pelvic tilt, slowed speed   TODAY'S TREATMENT:                                                                                                                              DATE:  01/04/23 NuStep L 5 x 6 min UBE L1 x2 min each Leg press 20lb 2x10 Rows & Lats 25lb 2x12 Lumbar Ext Black band 2x10 Horiz shoulder abd back against wall with half foam roll red 2x10 MHP to lumbar spine and R shoulder x10 min   01/02/23 NuStep L 5 x 6 min UBE L1 x2 min each W back with over pressure x10 Rows & Lats 25lb 2x10  Lumbar Ext Black band 2x10 Shoulder Ext green 2x15 Leg Ext 5lb 2x10 MHP to lumbar spine and R shoulder x10 min  12/28/22 NuStep L 5 x 6 min UBE L1 x 3  min Shoulder Ext 5lb 2x12 HS curls 25lb 2x10 Leg Ext 5lb 2x10 Horiz abd red 2x12  S2S OHP red ball 2x10 MHP to lumbar spine   bridges  Stretching HS, Piriformis, ITB, lower trnk rotations  12/25/22 NuStep L 5 x 7 min Shoulder Ext 5lb 2x10 Horiz abd 2x10  MHP to lumbar spine   Bridges   LE on pball, Oblq, K2C, Bridges  Stretching HS, Piriformis, ITB, lower trnk rotations Bilat shoulder flex w/ lumbar Ext 2lb WaTE 2x10  12/21/22 NuStep L 5 x 7 min Bilat shoulder flex w/ lumbar Ext 2lb WaTE x10 Rows & Lats  20lb 2x10 Horiz Abd green 2x10  S2S OHP red ball 2x10  W back with over pressure 2x10 S2S OHP red ball 2x10  12/19/22 NuStep L 5 x 7 min Rows and Ext green 2x10 each S2S OHP red ball 2x10  Seated trunk rotation yellow ball  STM to para spinales of T spine Horiz abd green 2x10  Some over pressure on shoulder with pt in supine  Bridges    12/14/22 Dyna disc pelvic mobility 4 way 10 x each On dyna disc green tband shld ext and row 2 sets 10.Horiz abd red 2x10 Passive HS, piriformis, K2C stretch, trunk rotation- very tight BIl Left > RT and Left side more painful LE on Pball bridges, K2C, Oblq Isometric abdominals  Supine 2# hand wts horz abd hold 5 sec 10 x to open up chest Nustep L 4 Passive HS, piriformis, ITB, K2C stretch    PATIENT EDUCATION:  Education details: POC and HEP Person educated: Patient and Child(ren) Education method: Explanation Education comprehension: verbalized understanding  HOME EXERCISE PROGRAM: Access Code: [email or web address] URL: [email or web address] Date: 12/04/2022 Prepared by: Cassie Freer  Exercises - Supine Lower Trunk Rotation  - 1 x daily - 7 x weekly - 2 sets - 10 reps - Supine Bridge  - 1 x daily - 7 x weekly - 2 sets - 10 reps - Supine Single Knee to Chest Stretch  - 2 x daily - 7 x weekly - 30 hold - Supine Active Straight Leg Raise  - 1 x daily - 7 x weekly - 2 sets - 10 reps - Seated Hamstring Stretch  - 2 x daily - 7 x weekly - 30 hold  ASSESSMENT:  CLINICAL IMPRESSION: Patient is a 89 y.o. male who was seen today for physical therapy treatment for low back and L hip pain. He presents with increase thoracic kyphosis, and heavy posterior pelvic tilt.  His posture is his biggest limiting factor and plays a role in his pain. Continued with postural strengthening interventions. He has a difficult time manipulating his alignment even with tactile cues. Pt felt like the heat made his back feel better so that was continues.  Postural cue needed with horizontal abduction. Cue for core engagement needed with seated rows and lat pull downs.   OBJECTIVE IMPAIRMENTS: Abnormal gait, difficulty walking, decreased strength, improper body mechanics, postural dysfunction, and pain.   ACTIVITY LIMITATIONS: bending, sitting, standing, stairs, and locomotion level   REHAB POTENTIAL: Good  CLINICAL DECISION MAKING: Stable/uncomplicated  EVALUATION COMPLEXITY: Low  GOALS: Goals reviewed with patient? Yes  SHORT TERM GOALS: Target date: 01/15/23  Patient will be independent with initial HEP.  Goal status: Met 12/21/22  2.  Patient will report centralization of radicular symptoms.  Baseline: radiating pain in to leg Goal status: on going  3.  Patient will demonstrate TUG < 15s  Baseline: 19.01  Goal  status: 17.64 progressing 12/21/22    LONG TERM GOALS: Target date: 02/26/23  Patient will be independent with advanced/ongoing HEP to improve outcomes and carryover.  Goal status: INITIAL  2.  Patient will report 75% improvement in low back pain to improve QOL.  Baseline: 7/10 Goal status: On going  3.  Patient will demonstrate full pain free lumbar ROM to perform ADLs.   Goal status: On going   4.  Patient to demonstrate ability to achieve and maintain good spinal alignment/posturing and body mechanics needed for daily activities. Baseline: forward head, kyphosis, posterior pelvis tilt Goal status: on going  PLAN:  PT FREQUENCY: 2x/week  PT DURATION: 12 weeks  PLANNED INTERVENTIONS: Therapeutic exercises, Therapeutic activity, Neuromuscular re-education, Balance training, Gait training, Patient/Family education, Self Care, Joint mobilization, Joint manipulation, Stair training, Dry Needling, Electrical stimulation, Spinal manipulation, Spinal mobilization, Cryotherapy, Moist heat, and Manual therapy.  PLAN FOR NEXT SESSION: NuStep, postural exercises for upper back and pelvis   Grayce Sessions, PTA

## 2023-01-09 ENCOUNTER — Ambulatory Visit: Payer: Medicare Other

## 2023-01-09 ENCOUNTER — Ambulatory Visit: Payer: Medicare Other | Admitting: Physical Therapy

## 2023-01-09 ENCOUNTER — Encounter: Payer: Self-pay | Admitting: Physical Therapy

## 2023-01-09 DIAGNOSIS — M5459 Other low back pain: Secondary | ICD-10-CM | POA: Diagnosis not present

## 2023-01-09 DIAGNOSIS — M6281 Muscle weakness (generalized): Secondary | ICD-10-CM

## 2023-01-09 DIAGNOSIS — R293 Abnormal posture: Secondary | ICD-10-CM | POA: Diagnosis not present

## 2023-01-09 DIAGNOSIS — M5442 Lumbago with sciatica, left side: Secondary | ICD-10-CM

## 2023-01-09 NOTE — Therapy (Signed)
OUTPATIENT PHYSICAL THERAPY THORACOLUMBAR TREATMENT Progress Note Reporting Period 12/04/22 to 01/09/23  See note below for Objective Data and Assessment of Progress/Goals.      Patient Name: Cameron Todd MRN: [id number] DOB:June 21, 1933, 89 y.o., male Today's Date: 01/09/2023  END OF SESSION:  PT End of Session - 01/09/23 1521     Visit Number 10    Date for PT Re-Evaluation 02/26/23    PT Start Time 1515    PT Stop Time 1600    PT Time Calculation (min) 45 min    Activity Tolerance Patient tolerated treatment well    Behavior During Therapy WFL for tasks assessed/performed             Past Medical History:  Diagnosis Date   Arthritis    Barrett's esophagus    Compression fracture of spine (HCC)    T 10   Diabetes (HCC)    Elevated cholesterol    Gastric ulcer 1990   Hemorrhoids    History of colon polyps    Hypertension    Tubular adenoma of colon    Past Surgical History:  Procedure Laterality Date   cataract surgery bilateral     NO PAST SURGERIES     UPPER GASTROINTESTINAL ENDOSCOPY     Patient Active Problem List   Diagnosis Date Noted   Age related osteoporosis 11/09/2020   Cold intolerance 11/09/2020   Polyneuropathy 11/09/2020   Vitamin D deficiency 11/09/2020   Dorsalgia 11/09/2020   Controlled type 2 diabetes mellitus without complication, without long-term current use of insulin (HCC) 11/09/2020   Hypertensive heart and renal disease 11/09/2020   Benign prostatic hyperplasia with urinary hesitancy 11/09/2020   Barrett's esophagus 05/20/2013   HTN (hypertension) 05/20/2013   GENERALIZED OSTEOARTHROSIS UNSPECIFIED SITE 06/10/2008   EPIGASTRIC PAIN 06/10/2008   GASTRIC ULCER, HX OF 06/10/2008   INTERNAL HEMORRHOIDS 06/08/2008    PCP: Nadene Rubins  REFERRING PROVIDER: Coletta Memos  REFERRING DIAG:  M54.42 (ICD-10-CM) - Lumbago with sciatica, left side    Rationale for Evaluation and Treatment: Rehabilitation  THERAPY DIAG:   Abnormal posture  Other low back pain  Muscle weakness (generalized)  Left-sided low back pain with left-sided sciatica, unspecified chronicity  ONSET DATE: 11/27/22  SUBJECTIVE:                                                                                                                                                                                           SUBJECTIVE STATEMENT: "Just Slow" PERTINENT HISTORY:  No surgical hx, arthritis   PAIN:  Are you having pain? Yes: NPRS scale: 5-6/10 Pain location: L hip down my  leg, low back Pain description: achy, sharp, radiating down leg, constant and never goes away, numbness Aggravating factors: standing, walking Relieving factors: just the medicine but only for a short time  PRECAUTIONS: None  WEIGHT BEARING RESTRICTIONS: No  FALLS:  Has patient fallen in last 6 months? No  LIVING ENVIRONMENT: Lives with: lives with their spouse Lives in: House/apartment Stairs: Yes: External: 3-4 steps; on left going up Has following equipment at home: None  OCCUPATION: Retired  PLOF: Independent and Independent with basic ADLs  PATIENT GOALS: some how get rid of the pain in my back and leg   NEXT MD VISIT: 12/11/22- spine doctor for injection  OBJECTIVE:   DIAGNOSTIC FINDINGS:  IMPRESSION: 1. Left lateral recess narrowing at L4-L5 and L5-S1, which may be a source of left L5 and/or S1 radiculopathy. 2. No central spinal canal or neural foraminal stenosis. 3. Chronic compression deformity of T11 with mild narrowing of the ventral thecal sac.   SCREENING FOR RED FLAGS: Bowel or bladder incontinence: No Spinal tumors: No Cauda equina syndrome: No Compression fracture: No Abdominal aneurysm: No  COGNITION: Overall cognitive status: Within functional limits for tasks assessed     SENSATION: WFL  MUSCLE LENGTH: Hamstrings: extremely tight in BLE  POSTURE: rounded shoulders, forward head, increased thoracic kyphosis,  posterior pelvic tilt, and flexed trunk   PALPATION: TTP SIJ, L4-L5  LUMBAR ROM:   AROM eval 12/21/22 01/09/23  Flexion 75% pain in low back [facility]  Extension 25% with pain Limited 50% with pain Limuted 25%  Right lateral flexion Fib head  WFL WFL  Left lateral flexion Fib head, some pain WFL with pain WFL  Right rotation 25% with pain Limited 75% Limited 50%  Left rotation 25% with pain Limited 75% Limited 50%   (Blank rows = not tested)  LOWER EXTREMITY ROM:   grossly WFL    LOWER EXTREMITY MMT:  grossly 4-4+/5    LUMBAR SPECIAL TESTS:  Straight leg raise test: Positive and Slump test: Negative  FUNCTIONAL TESTS:  5 times sit to stand: 14.34s Timed up and go (TUG): 19.01s  GAIT: Distance walked: in clinic distances Assistive device utilized: None Level of assistance: Modified independence Comments: flexed trunk, decreased foot clearance, forward head, kyphotic posture, increased posterior pelvic tilt, slowed speed   TODAY'S TREATMENT:                                                                                                                              DATE:  01/09/23 NuStep L 5 x 6 min CHECKED GOALS Rows & Lats 35lb 2x10 Lumbar Ext Black band 3x10 Hip Ext 5lb x 10 each MHP to lumbar spine  Bridges  SLR x10 each   Ball squeeze and bridge   01/04/23 NuStep L 5 x 6 min UBE L1 x2 min each Leg press 20lb 2x10 Rows & Lats 25lb 2x12 Lumbar Ext Black band 2x10 Horiz shoulder abd back against wall with  half foam roll red 2x10 MHP to lumbar spine and R shoulder x10 min   01/02/23 NuStep L 5 x 6 min UBE L1 x2 min each W back with over pressure x10 Rows & Lats 25lb 2x10  Lumbar Ext Black band 2x10 Shoulder Ext green 2x15 Leg Ext 5lb 2x10 MHP to lumbar spine and R shoulder x10 min  12/28/22 NuStep L 5 x 6 min UBE L1 x 3 min Shoulder Ext 5lb 2x12 HS curls 25lb 2x10 Leg Ext 5lb 2x10 Horiz abd red 2x12  S2S OHP red ball 2x10 MHP to lumbar spine    bridges  Stretching HS, Piriformis, ITB, lower trnk rotations  12/25/22 NuStep L 5 x 7 min Shoulder Ext 5lb 2x10 Horiz abd 2x10  MHP to lumbar spine   Bridges   LE on pball, Oblq, K2C, Bridges  Stretching HS, Piriformis, ITB, lower trnk rotations Bilat shoulder flex w/ lumbar Ext 2lb WaTE 2x10  12/21/22 NuStep L 5 x 7 min Bilat shoulder flex w/ lumbar Ext 2lb WaTE x10 Rows & Lats 20lb 2x10 Horiz Abd green 2x10  S2S OHP red ball 2x10  W back with over pressure 2x10 S2S OHP red ball 2x10  12/19/22 NuStep L 5 x 7 min Rows and Ext green 2x10 each S2S OHP red ball 2x10  Seated trunk rotation yellow ball  STM to para spinales of T spine Horiz abd green 2x10  Some over pressure on shoulder with pt in supine  Bridges    12/14/22 Dyna disc pelvic mobility 4 way 10 x each On dyna disc green tband shld ext and row 2 sets 10.Horiz abd red 2x10 Passive HS, piriformis, K2C stretch, trunk rotation- very tight BIl Left > RT and Left side more painful LE on Pball bridges, K2C, Oblq Isometric abdominals  Supine 2# hand wts horz abd hold 5 sec 10 x to open up chest Nustep L 4 Passive HS, piriformis, ITB, K2C stretch    PATIENT EDUCATION:  Education details: POC and HEP Person educated: Patient and Child(ren) Education method: Explanation Education comprehension: verbalized understanding  HOME EXERCISE PROGRAM: Access Code: [email or web address] URL: [email or web address] Date: 12/04/2022 Prepared by: Cassie Freer  Exercises - Supine Lower Trunk Rotation  - 1 x daily - 7 x weekly - 2 sets - 10 reps - Supine Bridge  - 1 x daily - 7 x weekly - 2 sets - 10 reps - Supine Single Knee to Chest Stretch  - 2 x daily - 7 x weekly - 30 hold - Supine Active Straight Leg Raise  - 1 x daily - 7 x weekly - 2 sets - 10 reps - Seated Hamstring Stretch  - 2 x daily - 7 x weekly - 30 hold  ASSESSMENT:  CLINICAL IMPRESSION: Patient is a 89 y.o. male who was seen today for physical  therapy treatment for low back and L hip pain. He presents with increase thoracic kyphosis, and heavy posterior pelvic tilt.  His posture is his biggest limiting factor and plays a role in his pain. Continued with postural strengthening interventions. He has progressed decreasing TUG time meeting goal. He has a difficult time manipulating his alignment even with tactile cues. Pt felt like the heat made his back feel better so that was continues. Postural cue needed with seated rows. Cue for core engagement needed with SLR. Slight increase in lumbar ROM.    OBJECTIVE IMPAIRMENTS: Abnormal gait, difficulty walking, decreased strength, improper body mechanics, postural dysfunction, and pain.   ACTIVITY LIMITATIONS:  bending, sitting, standing, stairs, and locomotion level   REHAB POTENTIAL: Good  CLINICAL DECISION MAKING: Stable/uncomplicated  EVALUATION COMPLEXITY: Low  GOALS: Goals reviewed with patient? Yes  SHORT TERM GOALS: Target date: 01/15/23  Patient will be independent with initial HEP.  Goal status: Met 12/21/22  2.  Patient will report centralization of radicular symptoms.  Baseline: radiating pain in to leg Goal status: ongoing  3.  Patient will demonstrate TUG < 15s  Baseline: 19.01  Goal status: 14.82 Met  01/09/23    LONG TERM GOALS: Target date: 02/26/23  Patient will be independent with advanced/ongoing HEP to improve outcomes and carryover.  Goal status: INITIAL  2.  Patient will report 75% improvement in low back pain to improve QOL.  Baseline: 7/10 Goal status: Progressing 01/09/23  3.  Patient will demonstrate full pain free lumbar ROM to perform ADLs.   Goal status: On going 01/09/23  4.  Patient to demonstrate ability to achieve and maintain good spinal alignment/posturing and body mechanics needed for daily activities. Baseline: forward head, kyphosis, posterior pelvis tilt Goal status: on going 01/09/23  PLAN:  PT FREQUENCY: 2x/week  PT DURATION: 12  weeks  PLANNED INTERVENTIONS: Therapeutic exercises, Therapeutic activity, Neuromuscular re-education, Balance training, Gait training, Patient/Family education, Self Care, Joint mobilization, Joint manipulation, Stair training, Dry Needling, Electrical stimulation, Spinal manipulation, Spinal mobilization, Cryotherapy, Moist heat, and Manual therapy.  PLAN FOR NEXT SESSION: NuStep, postural exercises for upper back and pelvis   Grayce Sessions, PTA

## 2023-01-11 ENCOUNTER — Ambulatory Visit: Payer: Medicare Other | Admitting: Physical Therapy

## 2023-01-11 ENCOUNTER — Encounter: Payer: Self-pay | Admitting: Physical Therapy

## 2023-01-11 ENCOUNTER — Ambulatory Visit: Payer: Medicare Other

## 2023-01-11 DIAGNOSIS — M6281 Muscle weakness (generalized): Secondary | ICD-10-CM | POA: Diagnosis not present

## 2023-01-11 DIAGNOSIS — M5442 Lumbago with sciatica, left side: Secondary | ICD-10-CM | POA: Diagnosis not present

## 2023-01-11 DIAGNOSIS — R293 Abnormal posture: Secondary | ICD-10-CM

## 2023-01-11 DIAGNOSIS — M5459 Other low back pain: Secondary | ICD-10-CM

## 2023-01-11 NOTE — Therapy (Signed)
OUTPATIENT PHYSICAL THERAPY THORACOLUMBAR TREATMENT     Patient Name: Cameron Todd MRN: [id number] DOB:Sep 26, 1932, 89 y.o., male Today's Date: 01/11/2023  END OF SESSION:  PT End of Session - 01/11/23 1514     Visit Number 11    Date for PT Re-Evaluation 02/26/23    PT Start Time 1515    PT Stop Time 1600    PT Time Calculation (min) 45 min    Activity Tolerance Patient tolerated treatment well    Behavior During Therapy [facility] for tasks assessed/performed             Past Medical History:  Diagnosis Date   Arthritis    Barrett's esophagus    Compression fracture of spine (HCC)    T 10   Diabetes (HCC)    Elevated cholesterol    Gastric ulcer 1990   Hemorrhoids    History of colon polyps    Hypertension    Tubular adenoma of colon    Past Surgical History:  Procedure Laterality Date   cataract surgery bilateral     NO PAST SURGERIES     UPPER GASTROINTESTINAL ENDOSCOPY     Patient Active Problem List   Diagnosis Date Noted   Age related osteoporosis 11/09/2020   Cold intolerance 11/09/2020   Polyneuropathy 11/09/2020   Vitamin D deficiency 11/09/2020   Dorsalgia 11/09/2020   Controlled type 2 diabetes mellitus without complication, without long-term current use of insulin (HCC) 11/09/2020   Hypertensive heart and renal disease 11/09/2020   Benign prostatic hyperplasia with urinary hesitancy 11/09/2020   Barrett's esophagus 05/20/2013   HTN (hypertension) 05/20/2013   GENERALIZED OSTEOARTHROSIS UNSPECIFIED SITE 06/10/2008   EPIGASTRIC PAIN 06/10/2008   GASTRIC ULCER, HX OF 06/10/2008   INTERNAL HEMORRHOIDS 06/08/2008    PCP: Nadene Rubins  REFERRING PROVIDER: Coletta Memos  REFERRING DIAG:  M54.42 (ICD-10-CM) - Lumbago with sciatica, left side    Rationale for Evaluation and Treatment: Rehabilitation  THERAPY DIAG:  Abnormal posture  Other low back pain  Muscle weakness (generalized)  Left-sided low back pain with left-sided sciatica,  unspecified chronicity  ONSET DATE: 11/27/22  SUBJECTIVE:                                                                                                                                                                                           SUBJECTIVE STATEMENT: Low back pain L side that goes down in the leg PERTINENT HISTORY:  No surgical hx, arthritis   PAIN:  Are you having pain? Yes: NPRS scale: 6/10 Pain location: L hip down my leg, low back Pain description: achy, sharp, radiating down leg,  constant and never goes away, numbness Aggravating factors: standing, walking Relieving factors: just the medicine but only for a short time  PRECAUTIONS: None  WEIGHT BEARING RESTRICTIONS: No  FALLS:  Has patient fallen in last 6 months? No  LIVING ENVIRONMENT: Lives with: lives with their spouse Lives in: House/apartment Stairs: Yes: External: 3-4 steps; on left going up Has following equipment at home: None  OCCUPATION: Retired  PLOF: Independent and Independent with basic ADLs  PATIENT GOALS: some how get rid of the pain in my back and leg   NEXT MD VISIT: 12/11/22- spine doctor for injection  OBJECTIVE:   DIAGNOSTIC FINDINGS:  IMPRESSION: 1. Left lateral recess narrowing at L4-L5 and L5-S1, which may be a source of left L5 and/or S1 radiculopathy. 2. No central spinal canal or neural foraminal stenosis. 3. Chronic compression deformity of T11 with mild narrowing of the ventral thecal sac.   SCREENING FOR RED FLAGS: Bowel or bladder incontinence: No Spinal tumors: No Cauda equina syndrome: No Compression fracture: No Abdominal aneurysm: No  COGNITION: Overall cognitive status: Within functional limits for tasks assessed     SENSATION: WFL  MUSCLE LENGTH: Hamstrings: extremely tight in BLE  POSTURE: rounded shoulders, forward head, increased thoracic kyphosis, posterior pelvic tilt, and flexed trunk   PALPATION: TTP SIJ, L4-L5  LUMBAR ROM:   AROM eval  12/21/22 01/09/23  Flexion 75% pain in low back [facility]  Extension 25% with pain Limited 50% with pain Limuted 25%  Right lateral flexion Fib head  WFL WFL  Left lateral flexion Fib head, some pain WFL with pain WFL  Right rotation 25% with pain Limited 75% Limited 50%  Left rotation 25% with pain Limited 75% Limited 50%   (Blank rows = not tested)  LOWER EXTREMITY ROM:   grossly WFL    LOWER EXTREMITY MMT:  grossly 4-4+/5    LUMBAR SPECIAL TESTS:  Straight leg raise test: Positive and Slump test: Negative  FUNCTIONAL TESTS:  5 times sit to stand: 14.34s Timed up and go (TUG): 19.01s  GAIT: Distance walked: in clinic distances Assistive device utilized: None Level of assistance: Modified independence Comments: flexed trunk, decreased foot clearance, forward head, kyphotic posture, increased posterior pelvic tilt, slowed speed   TODAY'S TREATMENT:                                                                                                                              DATE:  01/11/23 NuStep L 6 x 7 min UBE L2 x 2 min each Shoulder flex for posture 2lb WaTE 2x10 Standing OHP yellow ball 2x10 Standing rows & Ext green 2x12 4in forward and lateral step ups  Gait 1 lap ~100 feet    01/09/23 NuStep L 5 x 6 min CHECKED GOALS Rows & Lats 35lb 2x10 Lumbar Ext Black band 3x10 Hip Ext 5lb x 10 each MHP to lumbar spine  Bridges  SLR x10 each   Ball squeeze and  bridge   01/04/23 NuStep L 5 x 6 min UBE L1 x2 min each Leg press 20lb 2x10 Rows & Lats 25lb 2x12 Lumbar Ext Black band 2x10 Horiz shoulder abd back against wall with half foam roll red 2x10 MHP to lumbar spine and R shoulder x10 min   01/02/23 NuStep L 5 x 6 min UBE L1 x2 min each W back with over pressure x10 Rows & Lats 25lb 2x10  Lumbar Ext Black band 2x10 Shoulder Ext green 2x15 Leg Ext 5lb 2x10 MHP to lumbar spine and R shoulder x10 min  12/28/22 NuStep L 5 x 6 min UBE L1 x 3 min Shoulder Ext 5lb  2x12 HS curls 25lb 2x10 Leg Ext 5lb 2x10 Horiz abd red 2x12  S2S OHP red ball 2x10 MHP to lumbar spine   bridges  Stretching HS, Piriformis, ITB, lower trnk rotations    PATIENT EDUCATION:  Education details: POC and HEP Person educated: Patient and Child(ren) Education method: Explanation Education comprehension: verbalized understanding  HOME EXERCISE PROGRAM: Access Code: [email or web address] URL: [email or web address] Date: 12/04/2022 Prepared by: Cassie Freer  Exercises - Supine Lower Trunk Rotation  - 1 x daily - 7 x weekly - 2 sets - 10 reps - Supine Bridge  - 1 x daily - 7 x weekly - 2 sets - 10 reps - Supine Single Knee to Chest Stretch  - 2 x daily - 7 x weekly - 30 hold - Supine Active Straight Leg Raise  - 1 x daily - 7 x weekly - 2 sets - 10 reps - Seated Hamstring Stretch  - 2 x daily - 7 x weekly - 30 hold  ASSESSMENT:  CLINICAL IMPRESSION: Patient is a 89 y.o. male who was seen today for physical therapy treatment for low back and L hip pain. He presents with increase thoracic kyphosis, and heavy posterior pelvic tilt.  His posture is his biggest limiting factor and plays a role in his pain. Continued with postural strengthening interventions. Pt reports that's he wanted to work on his posture when walking. Session focused mote on standing interventions.  He has a difficult time manipulating his alignment even with tactile cues.  Pt felt like the heat made his back feel better so that was continues. Postural cue needed throughout session.    OBJECTIVE IMPAIRMENTS: Abnormal gait, difficulty walking, decreased strength, improper body mechanics, postural dysfunction, and pain.   ACTIVITY LIMITATIONS: bending, sitting, standing, stairs, and locomotion level   REHAB POTENTIAL: Good  CLINICAL DECISION MAKING: Stable/uncomplicated  EVALUATION COMPLEXITY: Low  GOALS: Goals reviewed with patient? Yes  SHORT TERM GOALS: Target date: 01/15/23  Patient will be  independent with initial HEP.  Goal status: Met 12/21/22  2.  Patient will report centralization of radicular symptoms.  Baseline: radiating pain in to leg Goal status: ongoing  3.  Patient will demonstrate TUG < 15s  Baseline: 19.01  Goal status: 14.82 Met  01/09/23    LONG TERM GOALS: Target date: 02/26/23  Patient will be independent with advanced/ongoing HEP to improve outcomes and carryover.  Goal status: INITIAL  2.  Patient will report 75% improvement in low back pain to improve QOL.  Baseline: 7/10 Goal status: Progressing 01/09/23  3.  Patient will demonstrate full pain free lumbar ROM to perform ADLs.   Goal status: On going 01/09/23  4.  Patient to demonstrate ability to achieve and maintain good spinal alignment/posturing and body mechanics needed for daily activities. Baseline: forward head, kyphosis, posterior pelvis tilt Goal status:  on going 01/09/23  PLAN:  PT FREQUENCY: 2x/week  PT DURATION: 12 weeks  PLANNED INTERVENTIONS: Therapeutic exercises, Therapeutic activity, Neuromuscular re-education, Balance training, Gait training, Patient/Family education, Self Care, Joint mobilization, Joint manipulation, Stair training, Dry Needling, Electrical stimulation, Spinal manipulation, Spinal mobilization, Cryotherapy, Moist heat, and Manual therapy.  PLAN FOR NEXT SESSION: NuStep, postural exercises for upper back and pelvis   Grayce Sessions, PTA

## 2023-01-15 ENCOUNTER — Encounter: Payer: Self-pay | Admitting: Physical Therapy

## 2023-01-15 ENCOUNTER — Ambulatory Visit: Payer: Medicare Other | Admitting: Physical Therapy

## 2023-01-15 DIAGNOSIS — R293 Abnormal posture: Secondary | ICD-10-CM | POA: Diagnosis not present

## 2023-01-15 DIAGNOSIS — M5442 Lumbago with sciatica, left side: Secondary | ICD-10-CM

## 2023-01-15 DIAGNOSIS — M6281 Muscle weakness (generalized): Secondary | ICD-10-CM

## 2023-01-15 DIAGNOSIS — M5459 Other low back pain: Secondary | ICD-10-CM | POA: Diagnosis not present

## 2023-01-15 NOTE — Therapy (Signed)
OUTPATIENT PHYSICAL THERAPY THORACOLUMBAR TREATMENT     Patient Name: Cameron Todd MRN: [id number] DOB:09-20-1932, 89 y.o., male Today's Date: 01/15/2023  END OF SESSION:  PT End of Session - 01/15/23 1149     Visit Number 12    Date for PT Re-Evaluation 02/26/23    PT Start Time 1145    PT Stop Time 1230    PT Time Calculation (min) 45 min    Activity Tolerance Patient tolerated treatment well    Behavior During Therapy [facility] for tasks assessed/performed             Past Medical History:  Diagnosis Date   Arthritis    Barrett's esophagus    Compression fracture of spine (HCC)    T 10   Diabetes (HCC)    Elevated cholesterol    Gastric ulcer 1990   Hemorrhoids    History of colon polyps    Hypertension    Tubular adenoma of colon    Past Surgical History:  Procedure Laterality Date   cataract surgery bilateral     NO PAST SURGERIES     UPPER GASTROINTESTINAL ENDOSCOPY     Patient Active Problem List   Diagnosis Date Noted   Age related osteoporosis 11/09/2020   Cold intolerance 11/09/2020   Polyneuropathy 11/09/2020   Vitamin D deficiency 11/09/2020   Dorsalgia 11/09/2020   Controlled type 2 diabetes mellitus without complication, without long-term current use of insulin (HCC) 11/09/2020   Hypertensive heart and renal disease 11/09/2020   Benign prostatic hyperplasia with urinary hesitancy 11/09/2020   Barrett's esophagus 05/20/2013   HTN (hypertension) 05/20/2013   GENERALIZED OSTEOARTHROSIS UNSPECIFIED SITE 06/10/2008   EPIGASTRIC PAIN 06/10/2008   GASTRIC ULCER, HX OF 06/10/2008   INTERNAL HEMORRHOIDS 06/08/2008    PCP: Nadene Rubins  REFERRING PROVIDER: Coletta Memos  REFERRING DIAG:  M54.42 (ICD-10-CM) - Lumbago with sciatica, left side    Rationale for Evaluation and Treatment: Rehabilitation  THERAPY DIAG:  Abnormal posture  Other low back pain  Muscle weakness (generalized)  Left-sided low back pain with left-sided sciatica,  unspecified chronicity  ONSET DATE: 11/27/22  SUBJECTIVE:                                                                                                                                                                                           SUBJECTIVE STATEMENT: Pain in the low back and down in to the L leg PERTINENT HISTORY:  No surgical hx, arthritis   PAIN:  Are you having pain? Yes: NPRS scale: 6/10 Pain location: L hip down my leg, low back Pain description: achy, sharp, radiating down  leg, constant and never goes away, numbness Aggravating factors: standing, walking Relieving factors: just the medicine but only for a short time  PRECAUTIONS: None  WEIGHT BEARING RESTRICTIONS: No  FALLS:  Has patient fallen in last 6 months? No  LIVING ENVIRONMENT: Lives with: lives with their spouse Lives in: House/apartment Stairs: Yes: External: 3-4 steps; on left going up Has following equipment at home: None  OCCUPATION: Retired  PLOF: Independent and Independent with basic ADLs  PATIENT GOALS: some how get rid of the pain in my back and leg   NEXT MD VISIT: 12/11/22- spine doctor for injection  OBJECTIVE:   DIAGNOSTIC FINDINGS:  IMPRESSION: 1. Left lateral recess narrowing at L4-L5 and L5-S1, which may be a source of left L5 and/or S1 radiculopathy. 2. No central spinal canal or neural foraminal stenosis. 3. Chronic compression deformity of T11 with mild narrowing of the ventral thecal sac.   SCREENING FOR RED FLAGS: Bowel or bladder incontinence: No Spinal tumors: No Cauda equina syndrome: No Compression fracture: No Abdominal aneurysm: No  COGNITION: Overall cognitive status: Within functional limits for tasks assessed     SENSATION: WFL  MUSCLE LENGTH: Hamstrings: extremely tight in BLE  POSTURE: rounded shoulders, forward head, increased thoracic kyphosis, posterior pelvic tilt, and flexed trunk   PALPATION: TTP SIJ, L4-L5  LUMBAR ROM:   AROM eval  12/21/22 01/09/23  Flexion 75% pain in low back [facility]  Extension 25% with pain Limited 50% with pain Limuted 25%  Right lateral flexion Fib head  WFL WFL  Left lateral flexion Fib head, some pain WFL with pain WFL  Right rotation 25% with pain Limited 75% Limited 50%  Left rotation 25% with pain Limited 75% Limited 50%   (Blank rows = not tested)  LOWER EXTREMITY ROM:   grossly WFL    LOWER EXTREMITY MMT:  grossly 4-4+/5    LUMBAR SPECIAL TESTS:  Straight leg raise test: Positive and Slump test: Negative  FUNCTIONAL TESTS:  5 times sit to stand: 14.34s Timed up and go (TUG): 19.01s  GAIT: Distance walked: in clinic distances Assistive device utilized: None Level of assistance: Modified independence Comments: flexed trunk, decreased foot clearance, forward head, kyphotic posture, increased posterior pelvic tilt, slowed speed   TODAY'S TREATMENT:                                                                                                                              DATE:  01/15/23 NuStep L 6 x 7 min Gait 40 feet Shoulder flex for posture 2lb WaTE 2x10 Standing kickbacks 5lbs x10 B Standing arrowheads with green t-band 2x15 Standing horizontal abd with red t-band 2x15 Standing rows with blue t-band 2x15 MHP to hips/low back in supine; legs bent and 3 pillows under head  01/11/23 NuStep L 6 x 7 min UBE L2 x 2 min each Shoulder flex for posture 2lb WaTE 2x10 Standing OHP yellow ball 2x10 Standing rows & Ext green  2x12 4in forward and lateral step ups  Gait 1 lap ~100 feet    01/09/23 NuStep L 5 x 6 min CHECKED GOALS Rows & Lats 35lb 2x10 Lumbar Ext Black band 3x10 Hip Ext 5lb x 10 each MHP to lumbar spine  Bridges  SLR x10 each   Ball squeeze and bridge   01/04/23 NuStep L 5 x 6 min UBE L1 x2 min each Leg press 20lb 2x10 Rows & Lats 25lb 2x12 Lumbar Ext Black band 2x10 Horiz shoulder abd back against wall with half foam roll red 2x10 MHP to lumbar  spine and R shoulder x10 min   01/02/23 NuStep L 5 x 6 min UBE L1 x2 min each W back with over pressure x10 Rows & Lats 25lb 2x10  Lumbar Ext Black band 2x10 Shoulder Ext green 2x15 Leg Ext 5lb 2x10 MHP to lumbar spine and R shoulder x10 min  12/28/22 NuStep L 5 x 6 min UBE L1 x 3 min Shoulder Ext 5lb 2x12 HS curls 25lb 2x10 Leg Ext 5lb 2x10 Horiz abd red 2x12  S2S OHP red ball 2x10 MHP to lumbar spine   bridges  Stretching HS, Piriformis, ITB, lower trnk rotations    PATIENT EDUCATION:  Education details: POC and HEP Person educated: Patient and Child(ren) Education method: Explanation Education comprehension: verbalized understanding  HOME EXERCISE PROGRAM: Access Code: [email or web address] URL: [email or web address] Date: 12/04/2022 Prepared by: Cassie Freer  Exercises - Supine Lower Trunk Rotation  - 1 x daily - 7 x weekly - 2 sets - 10 reps - Supine Bridge  - 1 x daily - 7 x weekly - 2 sets - 10 reps - Supine Single Knee to Chest Stretch  - 2 x daily - 7 x weekly - 30 hold - Supine Active Straight Leg Raise  - 1 x daily - 7 x weekly - 2 sets - 10 reps - Seated Hamstring Stretch  - 2 x daily - 7 x weekly - 30 hold  ASSESSMENT:  CLINICAL IMPRESSION: Patient is a 89 y.o. male who was seen today for physical therapy treatment for low back and L hip pain. He presents with increased thoracic kyphosis, and heavy posterior pelvic tilt.  His posture is his biggest limiting factor and plays a role in his pain. Continued with postural strengthening interventions. Pt saw video of him walking and notices his posture. He has a difficult time manipulating his alignment even with tactile cues. Postural cues needed throughout session. PTA talked to patient's daughter on phone about treatment. Hot pack was applied to decrease the patient's pain.   OBJECTIVE IMPAIRMENTS: Abnormal gait, difficulty walking, decreased strength, improper body mechanics, postural dysfunction, and pain.    ACTIVITY LIMITATIONS: bending, sitting, standing, stairs, and locomotion level   REHAB POTENTIAL: Good  CLINICAL DECISION MAKING: Stable/uncomplicated  EVALUATION COMPLEXITY: Low  GOALS: Goals reviewed with patient? Yes  SHORT TERM GOALS: Target date: 01/15/23  Patient will be independent with initial HEP.  Goal status: Met 12/21/22  2.  Patient will report centralization of radicular symptoms.  Baseline: radiating pain in to leg Goal status: ongoing  3.  Patient will demonstrate TUG < 15s  Baseline: 19.01  Goal status: 14.82 Met  01/09/23    LONG TERM GOALS: Target date: 02/26/23  Patient will be independent with advanced/ongoing HEP to improve outcomes and carryover.  Goal status: INITIAL  2.  Patient will report 75% improvement in low back pain to improve QOL.  Baseline: 7/10 Goal status: Progressing 01/09/23  3.  Patient will demonstrate full pain free lumbar ROM to perform ADLs.   Goal status: On going 01/09/23  4.  Patient to demonstrate ability to achieve and maintain good spinal alignment/posturing and body mechanics needed for daily activities. Baseline: forward head, kyphosis, posterior pelvis tilt Goal status: on going 01/09/23  PLAN:  PT FREQUENCY: 2x/week  PT DURATION: 12 weeks  PLANNED INTERVENTIONS: Therapeutic exercises, Therapeutic activity, Neuromuscular re-education, Balance training, Gait training, Patient/Family education, Self Care, Joint mobilization, Joint manipulation, Stair training, Dry Needling, Electrical stimulation, Spinal manipulation, Spinal mobilization, Cryotherapy, Moist heat, and Manual therapy.  PLAN FOR NEXT SESSION: NuStep, postural exercises for upper back and pelvis   Grayce Sessions, PTA

## 2023-01-18 ENCOUNTER — Encounter: Payer: Self-pay | Admitting: Physical Therapy

## 2023-01-18 ENCOUNTER — Ambulatory Visit: Payer: Medicare Other | Admitting: Physical Therapy

## 2023-01-18 DIAGNOSIS — R293 Abnormal posture: Secondary | ICD-10-CM

## 2023-01-18 DIAGNOSIS — M5442 Lumbago with sciatica, left side: Secondary | ICD-10-CM | POA: Diagnosis not present

## 2023-01-18 DIAGNOSIS — M6281 Muscle weakness (generalized): Secondary | ICD-10-CM

## 2023-01-18 DIAGNOSIS — M5459 Other low back pain: Secondary | ICD-10-CM

## 2023-01-18 NOTE — Therapy (Signed)
OUTPATIENT PHYSICAL THERAPY THORACOLUMBAR TREATMENT     Patient Name: Cameron Todd MRN: [id number] DOB:[birth year], 89 y.o., male Today's Date: 01/18/2023  END OF SESSION:  PT End of Session - 01/18/23 1515     Visit Number 13    Date for PT Re-Evaluation 02/26/23    PT Start Time 1515    PT Stop Time 1600    PT Time Calculation (min) 45 min    Activity Tolerance Patient tolerated treatment well    Behavior During Therapy WFL for tasks assessed/performed             Past Medical History:  Diagnosis Date   Arthritis    Barrett's esophagus    Compression fracture of spine (HCC)    T 10   Diabetes (HCC)    Elevated cholesterol    Gastric ulcer 1990   Hemorrhoids    History of colon polyps    Hypertension    Tubular adenoma of colon    Past Surgical History:  Procedure Laterality Date   cataract surgery bilateral     NO PAST SURGERIES     UPPER GASTROINTESTINAL ENDOSCOPY     Patient Active Problem List   Diagnosis Date Noted   Age related osteoporosis 11/09/2020   Cold intolerance 11/09/2020   Polyneuropathy 11/09/2020   Vitamin D deficiency 11/09/2020   Dorsalgia 11/09/2020   Controlled type 2 diabetes mellitus without complication, without long-term current use of insulin (HCC) 11/09/2020   Hypertensive heart and renal disease 11/09/2020   Benign prostatic hyperplasia with urinary hesitancy 11/09/2020   Barrett's esophagus 05/20/2013   HTN (hypertension) 05/20/2013   GENERALIZED OSTEOARTHROSIS UNSPECIFIED SITE 06/10/2008   EPIGASTRIC PAIN 06/10/2008   GASTRIC ULCER, HX OF 06/10/2008   INTERNAL HEMORRHOIDS 06/08/2008    PCP: Nadene Rubins  REFERRING PROVIDER: Coletta Memos  REFERRING DIAG:  M54.42 (ICD-10-CM) - Lumbago with sciatica, left side    Rationale for Evaluation and Treatment: Rehabilitation  THERAPY DIAG:  Abnormal posture  Other low back pain  Muscle weakness (generalized)  Left-sided low back pain with left-sided sciatica,  unspecified chronicity  ONSET DATE: 11/27/22  SUBJECTIVE:                                                                                                                                                                                           SUBJECTIVE STATEMENT: Pretty much the same, constant ache in the back PERTINENT HISTORY:  No surgical hx, arthritis   PAIN:  Are you having pain? Yes: NPRS scale: 6/10 Pain location: L hip down my leg, low back Pain description: achy, sharp, radiating down leg, constant and  never goes away, numbness Aggravating factors: standing, walking Relieving factors: just the medicine but only for a short time  PRECAUTIONS: None  WEIGHT BEARING RESTRICTIONS: No  FALLS:  Has patient fallen in last 6 months? No  LIVING ENVIRONMENT: Lives with: lives with their spouse Lives in: House/apartment Stairs: Yes: External: 3-4 steps; on left going up Has following equipment at home: None  OCCUPATION: Retired  PLOF: Independent and Independent with basic ADLs  PATIENT GOALS: some how get rid of the pain in my back and leg   NEXT MD VISIT: 12/11/22- spine doctor for injection  OBJECTIVE:   DIAGNOSTIC FINDINGS:  IMPRESSION: 1. Left lateral recess narrowing at L4-L5 and L5-S1, which may be a source of left L5 and/or S1 radiculopathy. 2. No central spinal canal or neural foraminal stenosis. 3. Chronic compression deformity of T11 with mild narrowing of the ventral thecal sac.   SCREENING FOR RED FLAGS: Bowel or bladder incontinence: No Spinal tumors: No Cauda equina syndrome: No Compression fracture: No Abdominal aneurysm: No  COGNITION: Overall cognitive status: Within functional limits for tasks assessed     SENSATION: WFL  MUSCLE LENGTH: Hamstrings: extremely tight in BLE  POSTURE: rounded shoulders, forward head, increased thoracic kyphosis, posterior pelvic tilt, and flexed trunk   PALPATION: TTP SIJ, L4-L5  LUMBAR ROM:   AROM  eval 12/21/22 01/09/23  Flexion 75% pain in low back [facility]  Extension 25% with pain Limited 50% with pain Limuted 25%  Right lateral flexion Fib head  WFL WFL  Left lateral flexion Fib head, some pain WFL with pain WFL  Right rotation 25% with pain Limited 75% Limited 50%  Left rotation 25% with pain Limited 75% Limited 50%   (Blank rows = not tested)  LOWER EXTREMITY ROM:   grossly WFL    LOWER EXTREMITY MMT:  grossly 4-4+/5    LUMBAR SPECIAL TESTS:  Straight leg raise test: Positive and Slump test: Negative  FUNCTIONAL TESTS:  5 times sit to stand: 14.34s Timed up and go (TUG): 19.01s  GAIT: Distance walked: in clinic distances Assistive device utilized: None Level of assistance: Modified independence Comments: flexed trunk, decreased foot clearance, forward head, kyphotic posture, increased posterior pelvic tilt, slowed speed   TODAY'S TREATMENT:                                                                                                                              DATE:  01/18/23 NuStep L 6 x 7 min S2S OHP form elevated surface 2x10 Facing wall overhead ball Ext  Back against wall overhead Ext HS curls 25lb 2x10 Leg Ext 10lb 2x10 Shoulder Ext 5lb 2x10  MHP on lumbar spine  Bridges   SLR  Piriformis stretch   01/15/23 NuStep L 6 x 7 min Gait 40 feet Shoulder flex for posture 2lb WaTE 2x10 Standing kickbacks 5lbs x10 B Standing arrowheads with green t-band 2x15 Standing horizontal abd with red t-band 2x15 Standing rows  with blue t-band 2x15 MHP to hips/low back in supine; legs bent and 3 pillows under head  01/11/23 NuStep L 6 x 7 min UBE L2 x 2 min each Shoulder flex for posture 2lb WaTE 2x10 Standing OHP yellow ball 2x10 Standing rows & Ext green 2x12 4in forward and lateral step ups  Gait 1 lap ~100 feet    01/09/23 NuStep L 5 x 6 min CHECKED GOALS Rows & Lats 35lb 2x10 Lumbar Ext Black band 3x10 Hip Ext 5lb x 10 each MHP to lumbar  spine  Bridges  SLR x10 each   Ball squeeze and bridge   01/04/23 NuStep L 5 x 6 min UBE L1 x2 min each Leg press 20lb 2x10 Rows & Lats 25lb 2x12 Lumbar Ext Black band 2x10 Horiz shoulder abd back against wall with half foam roll red 2x10 MHP to lumbar spine and R shoulder x10 min   01/02/23 NuStep L 5 x 6 min UBE L1 x2 min each W back with over pressure x10 Rows & Lats 25lb 2x10  Lumbar Ext Black band 2x10 Shoulder Ext green 2x15 Leg Ext 5lb 2x10 MHP to lumbar spine and R shoulder x10 min  12/28/22 NuStep L 5 x 6 min UBE L1 x 3 min Shoulder Ext 5lb 2x12 HS curls 25lb 2x10 Leg Ext 5lb 2x10 Horiz abd red 2x12  S2S OHP red ball 2x10 MHP to lumbar spine   bridges  Stretching HS, Piriformis, ITB, lower trnk rotations    PATIENT EDUCATION:  Education details: POC and HEP Person educated: Patient and Child(ren) Education method: Explanation Education comprehension: verbalized understanding  HOME EXERCISE PROGRAM: Access Code: [email or web address] URL: [email or web address] Date: 12/04/2022 Prepared by: Cassie Freer  Exercises - Supine Lower Trunk Rotation  - 1 x daily - 7 x weekly - 2 sets - 10 reps - Supine Bridge  - 1 x daily - 7 x weekly - 2 sets - 10 reps - Supine Single Knee to Chest Stretch  - 2 x daily - 7 x weekly - 30 hold - Supine Active Straight Leg Raise  - 1 x daily - 7 x weekly - 2 sets - 10 reps - Seated Hamstring Stretch  - 2 x daily - 7 x weekly - 30 hold  ASSESSMENT:  CLINICAL IMPRESSION: Patient is a 89 y.o. male who was seen today for physical therapy treatment for low back and L hip pain. He presents with increased thoracic kyphosis, and heavy posterior pelvic tilt.  His posture is his biggest limiting factor and plays a role in his pain. Continued with postural strengthening interventions. Curs for full ROM needed with leg curls ad extensions. Cues for eccentric control needed with shoulder Ext.  He has a difficult time manipulating his  alignment even with tactile cues. Postural cues needed throughout session.     OBJECTIVE IMPAIRMENTS: Abnormal gait, difficulty walking, decreased strength, improper body mechanics, postural dysfunction, and pain.   ACTIVITY LIMITATIONS: bending, sitting, standing, stairs, and locomotion level   REHAB POTENTIAL: Good  CLINICAL DECISION MAKING: Stable/uncomplicated  EVALUATION COMPLEXITY: Low  GOALS: Goals reviewed with patient? Yes  SHORT TERM GOALS: Target date: 01/15/23  Patient will be independent with initial HEP.  Goal status: Met 12/21/22  2.  Patient will report centralization of radicular symptoms.  Baseline: radiating pain in to leg Goal status: ongoing  3.  Patient will demonstrate TUG < 15s  Baseline: 19.01  Goal status: 14.82 Met  01/09/23    LONG TERM GOALS: Target date: 02/26/23  Patient will be independent with advanced/ongoing HEP to improve outcomes and carryover.  Goal status: INITIAL  2.  Patient will report 75% improvement in low back pain to improve QOL.  Baseline: 7/10 Goal status: Progressing 01/09/23  3.  Patient will demonstrate full pain free lumbar ROM to perform ADLs.   Goal status: On going 01/09/23  4.  Patient to demonstrate ability to achieve and maintain good spinal alignment/posturing and body mechanics needed for daily activities. Baseline: forward head, kyphosis, posterior pelvis tilt Goal status: on going 01/09/23  PLAN:  PT FREQUENCY: 2x/week  PT DURATION: 12 weeks  PLANNED INTERVENTIONS: Therapeutic exercises, Therapeutic activity, Neuromuscular re-education, Balance training, Gait training, Patient/Family education, Self Care, Joint mobilization, Joint manipulation, Stair training, Dry Needling, Electrical stimulation, Spinal manipulation, Spinal mobilization, Cryotherapy, Moist heat, and Manual therapy.  PLAN FOR NEXT SESSION: NuStep, postural exercises for upper back and pelvis   Grayce Sessions, PTA

## 2023-01-23 ENCOUNTER — Ambulatory Visit: Payer: Medicare Other | Attending: Family Medicine | Admitting: Physical Therapy

## 2023-01-23 ENCOUNTER — Encounter: Payer: Self-pay | Admitting: Physical Therapy

## 2023-01-23 DIAGNOSIS — M6281 Muscle weakness (generalized): Secondary | ICD-10-CM | POA: Insufficient documentation

## 2023-01-23 DIAGNOSIS — M5459 Other low back pain: Secondary | ICD-10-CM | POA: Insufficient documentation

## 2023-01-23 DIAGNOSIS — R293 Abnormal posture: Secondary | ICD-10-CM | POA: Insufficient documentation

## 2023-01-23 DIAGNOSIS — M5442 Lumbago with sciatica, left side: Secondary | ICD-10-CM | POA: Diagnosis not present

## 2023-01-23 NOTE — Therapy (Signed)
OUTPATIENT PHYSICAL THERAPY THORACOLUMBAR TREATMENT     Patient Name: BERTIS DIEGO MRN: [id number] DOB:07/25/1932, 89 y.o., male Today's Date: 01/23/2023  END OF SESSION:  PT End of Session - 01/23/23 1519     Visit Number 14    Date for PT Re-Evaluation 02/26/23    PT Start Time 1519    PT Stop Time 1600    PT Time Calculation (min) 41 min    Activity Tolerance Patient tolerated treatment well    Behavior During Therapy WFL for tasks assessed/performed             Past Medical History:  Diagnosis Date   Arthritis    Barrett's esophagus    Compression fracture of spine (HCC)    T 10   Diabetes (HCC)    Elevated cholesterol    Gastric ulcer 1990   Hemorrhoids    History of colon polyps    Hypertension    Tubular adenoma of colon    Past Surgical History:  Procedure Laterality Date   cataract surgery bilateral     NO PAST SURGERIES     UPPER GASTROINTESTINAL ENDOSCOPY     Patient Active Problem List   Diagnosis Date Noted   Age related osteoporosis 11/09/2020   Cold intolerance 11/09/2020   Polyneuropathy 11/09/2020   Vitamin D deficiency 11/09/2020   Dorsalgia 11/09/2020   Controlled type 2 diabetes mellitus without complication, without long-term current use of insulin (HCC) 11/09/2020   Hypertensive heart and renal disease 11/09/2020   Benign prostatic hyperplasia with urinary hesitancy 11/09/2020   Barrett's esophagus 05/20/2013   HTN (hypertension) 05/20/2013   GENERALIZED OSTEOARTHROSIS UNSPECIFIED SITE 06/10/2008   EPIGASTRIC PAIN 06/10/2008   GASTRIC ULCER, HX OF 06/10/2008   INTERNAL HEMORRHOIDS 06/08/2008    PCP: Nadene Rubins  REFERRING PROVIDER: Coletta Memos  REFERRING DIAG:  M54.42 (ICD-10-CM) - Lumbago with sciatica, left side    Rationale for Evaluation and Treatment: Rehabilitation  THERAPY DIAG:  Abnormal posture  Other low back pain  Muscle weakness (generalized)  Left-sided low back pain with left-sided sciatica,  unspecified chronicity  ONSET DATE: 11/27/22  SUBJECTIVE:                                                                                                                                                                                           SUBJECTIVE STATEMENT: "Its ok" PERTINENT HISTORY:  No surgical hx, arthritis   PAIN:  Are you having pain? Yes: NPRS scale: 5/10 Pain location: L hip down my leg, low back Pain description: achy, sharp, radiating down leg, constant and never goes away, numbness Aggravating factors: standing,  walking Relieving factors: just the medicine but only for a short time  PRECAUTIONS: None  WEIGHT BEARING RESTRICTIONS: No  FALLS:  Has patient fallen in last 6 months? No  LIVING ENVIRONMENT: Lives with: lives with their spouse Lives in: House/apartment Stairs: Yes: External: 3-4 steps; on left going up Has following equipment at home: None  OCCUPATION: Retired  PLOF: Independent and Independent with basic ADLs  PATIENT GOALS: some how get rid of the pain in my back and leg   NEXT MD VISIT: 12/11/22- spine doctor for injection  OBJECTIVE:   DIAGNOSTIC FINDINGS:  IMPRESSION: 1. Left lateral recess narrowing at L4-L5 and L5-S1, which may be a source of left L5 and/or S1 radiculopathy. 2. No central spinal canal or neural foraminal stenosis. 3. Chronic compression deformity of T11 with mild narrowing of the ventral thecal sac.   SCREENING FOR RED FLAGS: Bowel or bladder incontinence: No Spinal tumors: No Cauda equina syndrome: No Compression fracture: No Abdominal aneurysm: No  COGNITION: Overall cognitive status: Within functional limits for tasks assessed     SENSATION: WFL  MUSCLE LENGTH: Hamstrings: extremely tight in BLE  POSTURE: rounded shoulders, forward head, increased thoracic kyphosis, posterior pelvic tilt, and flexed trunk   PALPATION: TTP SIJ, L4-L5  LUMBAR ROM:   AROM eval 12/21/22 01/09/23  Flexion 75% pain in  low back [facility]  Extension 25% with pain Limited 50% with pain Limuted 25%  Right lateral flexion Fib head  WFL WFL  Left lateral flexion Fib head, some pain WFL with pain WFL  Right rotation 25% with pain Limited 75% Limited 50%  Left rotation 25% with pain Limited 75% Limited 50%   (Blank rows = not tested)  LOWER EXTREMITY ROM:   grossly WFL    LOWER EXTREMITY MMT:  grossly 4-4+/5    LUMBAR SPECIAL TESTS:  Straight leg raise test: Positive and Slump test: Negative  FUNCTIONAL TESTS:  5 times sit to stand: 14.34s Timed up and go (TUG): 19.01s  GAIT: Distance walked: in clinic distances Assistive device utilized: None Level of assistance: Modified independence Comments: flexed trunk, decreased foot clearance, forward head, kyphotic posture, increased posterior pelvic tilt, slowed speed   TODAY'S TREATMENT:                                                                                                                              DATE:  01/23/23 NuStep L 5 x 7 min S2S OHP yellow ball 2x10 Standing rows 10lb 2x10 W backs Trunk rotations Shoulder Ext 5lb 2x10  MHP on lumbar spine  Passive stretching to HS, piriformis, Glute, ITB    01/18/23 NuStep L 6 x 7 min S2S OHP form elevated surface 2x10 Facing wall overhead ball Ext  Back against wall overhead Ext HS curls 25lb 2x10 Leg Ext 10lb 2x10 Shoulder Ext 5lb 2x10  MHP on lumbar spine  Bridges   SLR  Piriformis stretch   01/15/23 NuStep L 6  x 7 min Gait 40 feet Shoulder flex for posture 2lb WaTE 2x10 Standing kickbacks 5lbs x10 B Standing arrowheads with green t-band 2x15 Standing horizontal abd with red t-band 2x15 Standing rows with blue t-band 2x15 MHP to hips/low back in supine; legs bent and 3 pillows under head  01/11/23 NuStep L 6 x 7 min UBE L2 x 2 min each Shoulder flex for posture 2lb WaTE 2x10 Standing OHP yellow ball 2x10 Standing rows & Ext green 2x12 4in forward and lateral step ups  Gait 1  lap ~100 feet    01/09/23 NuStep L 5 x 6 min CHECKED GOALS Rows & Lats 35lb 2x10 Lumbar Ext Black band 3x10 Hip Ext 5lb x 10 each MHP to lumbar spine  Bridges  SLR x10 each   Ball squeeze and bridge   01/04/23 NuStep L 5 x 6 min UBE L1 x2 min each Leg press 20lb 2x10 Rows & Lats 25lb 2x12 Lumbar Ext Black band 2x10 Horiz shoulder abd back against wall with half foam roll red 2x10 MHP to lumbar spine and R shoulder x10 min    PATIENT EDUCATION:  Education details: POC and HEP Person educated: Patient and Child(ren) Education method: Explanation Education comprehension: verbalized understanding  HOME EXERCISE PROGRAM: Access Code: [email or web address] URL: [email or web address] Date: 12/04/2022 Prepared by: Cassie Freer  Exercises - Supine Lower Trunk Rotation  - 1 x daily - 7 x weekly - 2 sets - 10 reps - Supine Bridge  - 1 x daily - 7 x weekly - 2 sets - 10 reps - Supine Single Knee to Chest Stretch  - 2 x daily - 7 x weekly - 30 hold - Supine Active Straight Leg Raise  - 1 x daily - 7 x weekly - 2 sets - 10 reps - Seated Hamstring Stretch  - 2 x daily - 7 x weekly - 30 hold  ASSESSMENT:  CLINICAL IMPRESSION: Patient is a 89 y.o. male who was seen today for physical therapy treatment for low back and L hip pain. He presents with increased thoracic kyphosis, and heavy posterior pelvic tilt.  His posture is his biggest limiting factor and plays a role in his pain. Continued with postural strengthening interventions.Over pressure given with W backs for greater stretch. Cues for eccentric control needed with shoulder Ext.  He has a difficult time manipulating his alignment even with tactile cues. Postural cues needed throughout session.     OBJECTIVE IMPAIRMENTS: Abnormal gait, difficulty walking, decreased strength, improper body mechanics, postural dysfunction, and pain.   ACTIVITY LIMITATIONS: bending, sitting, standing, stairs, and locomotion level   REHAB  POTENTIAL: Good  CLINICAL DECISION MAKING: Stable/uncomplicated  EVALUATION COMPLEXITY: Low  GOALS: Goals reviewed with patient? Yes  SHORT TERM GOALS: Target date: 01/15/23  Patient will be independent with initial HEP.  Goal status: Met 12/21/22  2.  Patient will report centralization of radicular symptoms.  Baseline: radiating pain in to leg Goal status: ongoing  3.  Patient will demonstrate TUG < 15s  Baseline: 19.01  Goal status: 14.82 Met  01/09/23    LONG TERM GOALS: Target date: 02/26/23  Patient will be independent with advanced/ongoing HEP to improve outcomes and carryover.  Goal status: INITIAL  2.  Patient will report 75% improvement in low back pain to improve QOL.  Baseline: 7/10 Goal status: Progressing 01/09/23  3.  Patient will demonstrate full pain free lumbar ROM to perform ADLs.   Goal status: On going 01/09/23  4.  Patient to demonstrate ability  to achieve and maintain good spinal alignment/posturing and body mechanics needed for daily activities. Baseline: forward head, kyphosis, posterior pelvis tilt Goal status: on going 01/09/23  PLAN:  PT FREQUENCY: 2x/week  PT DURATION: 12 weeks  PLANNED INTERVENTIONS: Therapeutic exercises, Therapeutic activity, Neuromuscular re-education, Balance training, Gait training, Patient/Family education, Self Care, Joint mobilization, Joint manipulation, Stair training, Dry Needling, Electrical stimulation, Spinal manipulation, Spinal mobilization, Cryotherapy, Moist heat, and Manual therapy.  PLAN FOR NEXT SESSION: NuStep, postural exercises for upper back and pelvis   Grayce Sessions, PTA

## 2023-01-24 ENCOUNTER — Ambulatory Visit: Payer: Medicare Other | Admitting: Physical Therapy

## 2023-01-24 ENCOUNTER — Encounter: Payer: Self-pay | Admitting: Physical Therapy

## 2023-01-24 DIAGNOSIS — M6281 Muscle weakness (generalized): Secondary | ICD-10-CM

## 2023-01-24 DIAGNOSIS — M5459 Other low back pain: Secondary | ICD-10-CM

## 2023-01-24 DIAGNOSIS — R293 Abnormal posture: Secondary | ICD-10-CM | POA: Diagnosis not present

## 2023-01-24 DIAGNOSIS — M5442 Lumbago with sciatica, left side: Secondary | ICD-10-CM | POA: Diagnosis not present

## 2023-01-24 NOTE — Therapy (Signed)
OUTPATIENT PHYSICAL THERAPY THORACOLUMBAR TREATMENT     Patient Name: Cameron Todd MRN: [id number] DOB:06-Nov-1932, 89 y.o., male Today's Date: 01/24/2023  END OF SESSION:  PT End of Session - 01/24/23 1313     Visit Number 15    Date for PT Re-Evaluation 02/26/23    PT Start Time 1308   pt late   PT Stop Time 1343    PT Time Calculation (min) 35 min    Activity Tolerance Patient tolerated treatment well    Behavior During Therapy WFL for tasks assessed/performed              Past Medical History:  Diagnosis Date   Arthritis    Barrett's esophagus    Compression fracture of spine (HCC)    T 10   Diabetes (HCC)    Elevated cholesterol    Gastric ulcer 1990   Hemorrhoids    History of colon polyps    Hypertension    Tubular adenoma of colon    Past Surgical History:  Procedure Laterality Date   cataract surgery bilateral     NO PAST SURGERIES     UPPER GASTROINTESTINAL ENDOSCOPY     Patient Active Problem List   Diagnosis Date Noted   Age related osteoporosis 11/09/2020   Cold intolerance 11/09/2020   Polyneuropathy 11/09/2020   Vitamin D deficiency 11/09/2020   Dorsalgia 11/09/2020   Controlled type 2 diabetes mellitus without complication, without long-term current use of insulin (HCC) 11/09/2020   Hypertensive heart and renal disease 11/09/2020   Benign prostatic hyperplasia with urinary hesitancy 11/09/2020   Barrett's esophagus 05/20/2013   HTN (hypertension) 05/20/2013   GENERALIZED OSTEOARTHROSIS UNSPECIFIED SITE 06/10/2008   EPIGASTRIC PAIN 06/10/2008   GASTRIC ULCER, HX OF 06/10/2008   INTERNAL HEMORRHOIDS 06/08/2008    PCP: Nadene Rubins  REFERRING PROVIDER: Coletta Memos  REFERRING DIAG:  M54.42 (ICD-10-CM) - Lumbago with sciatica, left side    Rationale for Evaluation and Treatment: Rehabilitation  THERAPY DIAG:  Abnormal posture  Other low back pain  Muscle weakness (generalized)  Left-sided low back pain with left-sided  sciatica, unspecified chronicity  ONSET DATE: 11/27/22  SUBJECTIVE:                                                                                                                                                                                           SUBJECTIVE STATEMENT:   Things are pretty much the same, not much change. My back and hip are still bothering me.   PERTINENT HISTORY:  No surgical hx, arthritis   PAIN:  Are you having pain? Yes: NPRS scale: 6-7/10 Pain location:  L hip down my leg, low back Pain description: achy, sharp, radiating down leg, constant and never goes away, numbness Aggravating factors: standing, walking Relieving factors: just the medicine but only for a short time  PRECAUTIONS: None  WEIGHT BEARING RESTRICTIONS: No  FALLS:  Has patient fallen in last 6 months? No  LIVING ENVIRONMENT: Lives with: lives with their spouse Lives in: House/apartment Stairs: Yes: External: 3-4 steps; on left going up Has following equipment at home: None  OCCUPATION: Retired  PLOF: Independent and Independent with basic ADLs  PATIENT GOALS: some how get rid of the pain in my back and leg   NEXT MD VISIT: 12/11/22- spine doctor for injection  OBJECTIVE:   DIAGNOSTIC FINDINGS:  IMPRESSION: 1. Left lateral recess narrowing at L4-L5 and L5-S1, which may be a source of left L5 and/or S1 radiculopathy. 2. No central spinal canal or neural foraminal stenosis. 3. Chronic compression deformity of T11 with mild narrowing of the ventral thecal sac.   SCREENING FOR RED FLAGS: Bowel or bladder incontinence: No Spinal tumors: No Cauda equina syndrome: No Compression fracture: No Abdominal aneurysm: No  COGNITION: Overall cognitive status: Within functional limits for tasks assessed     SENSATION: WFL  MUSCLE LENGTH: Hamstrings: extremely tight in BLE  POSTURE: rounded shoulders, forward head, increased thoracic kyphosis, posterior pelvic tilt, and flexed  trunk   PALPATION: TTP SIJ, L4-L5  LUMBAR ROM:   AROM eval 12/21/22 01/09/23  Flexion 75% pain in low back [facility]  Extension 25% with pain Limited 50% with pain Limuted 25%  Right lateral flexion Fib head  WFL WFL  Left lateral flexion Fib head, some pain WFL with pain WFL  Right rotation 25% with pain Limited 75% Limited 50%  Left rotation 25% with pain Limited 75% Limited 50%   (Blank rows = not tested)  LOWER EXTREMITY ROM:   grossly WFL    LOWER EXTREMITY MMT:  grossly 4-4+/5    LUMBAR SPECIAL TESTS:  Straight leg raise test: Positive and Slump test: Negative  FUNCTIONAL TESTS:  5 times sit to stand: 14.34s Timed up and go (TUG): 19.01s  GAIT: Distance walked: in clinic distances Assistive device utilized: None Level of assistance: Modified independence Comments: flexed trunk, decreased foot clearance, forward head, kyphotic posture, increased posterior pelvic tilt, slowed speed   TODAY'S TREATMENT:                                                                                                                              DATE:   01/24/23  TherEx  UBE L1 backwards only for postural muscle activation x6 minutes Rows 25# 2x10  Trunk extensions against wall x3- limited by pain LLE  Thoracic extensions seated x10 Thoracic lateral flexion x10 B Thoracic rotation x10 B HS stretches 3x30 seconds B Piriformis stretch 2x30 seconds B  Lumbar rotation stretch 5x5 seconds B Attempted bridges- unable to tolerate due to pain QL stretch 3x15 seconds  01/23/23 NuStep L 5 x 7 min S2S OHP yellow ball 2x10 Standing rows 10lb 2x10 W backs Trunk rotations Shoulder Ext 5lb 2x10  MHP on lumbar spine  Passive stretching to HS, piriformis, Glute, ITB    01/18/23 NuStep L 6 x 7 min S2S OHP form elevated surface 2x10 Facing wall overhead ball Ext  Back against wall overhead Ext HS curls 25lb 2x10 Leg Ext 10lb 2x10 Shoulder Ext 5lb 2x10  MHP on lumbar spine  Bridges    SLR  Piriformis stretch   01/15/23 NuStep L 6 x 7 min Gait 40 feet Shoulder flex for posture 2lb WaTE 2x10 Standing kickbacks 5lbs x10 B Standing arrowheads with green t-band 2x15 Standing horizontal abd with red t-band 2x15 Standing rows with blue t-band 2x15 MHP to hips/low back in supine; legs bent and 3 pillows under head  01/11/23 NuStep L 6 x 7 min UBE L2 x 2 min each Shoulder flex for posture 2lb WaTE 2x10 Standing OHP yellow ball 2x10 Standing rows & Ext green 2x12 4in forward and lateral step ups  Gait 1 lap ~100 feet    01/09/23 NuStep L 5 x 6 min CHECKED GOALS Rows & Lats 35lb 2x10 Lumbar Ext Black band 3x10 Hip Ext 5lb x 10 each MHP to lumbar spine  Bridges  SLR x10 each   Ball squeeze and bridge   01/04/23 NuStep L 5 x 6 min UBE L1 x2 min each Leg press 20lb 2x10 Rows & Lats 25lb 2x12 Lumbar Ext Black band 2x10 Horiz shoulder abd back against wall with half foam roll red 2x10 MHP to lumbar spine and R shoulder x10 min    PATIENT EDUCATION:  Education details: POC and HEP Person educated: Patient and Child(ren) Education method: Explanation Education comprehension: verbalized understanding  HOME EXERCISE PROGRAM: Access Code: [email or web address] URL: [email or web address] Date: 12/04/2022 Prepared by: Cassie Freer  Exercises - Supine Lower Trunk Rotation  - 1 x daily - 7 x weekly - 2 sets - 10 reps - Supine Bridge  - 1 x daily - 7 x weekly - 2 sets - 10 reps - Supine Single Knee to Chest Stretch  - 2 x daily - 7 x weekly - 30 hold - Supine Active Straight Leg Raise  - 1 x daily - 7 x weekly - 2 sets - 10 reps - Seated Hamstring Stretch  - 2 x daily - 7 x weekly - 30 hold  ASSESSMENT:  CLINICAL IMPRESSION:  Mr. Penney arrives today doing OK, still having a lot of pain in low back and L LE and reports no significant change. We spent some time working further on postural mechanics and strengthening, also kept working on proximal  strengthening  for hips and core as able. Due to no reported subjective improvements, would consider formal objective re-assessment within the next couple of sessions for more in depth assessment of efficacy of POC.      OBJECTIVE IMPAIRMENTS: Abnormal gait, difficulty walking, decreased strength, improper body mechanics, postural dysfunction, and pain.   ACTIVITY LIMITATIONS: bending, sitting, standing, stairs, and locomotion level   REHAB POTENTIAL: Good  CLINICAL DECISION MAKING: Stable/uncomplicated  EVALUATION COMPLEXITY: Low  GOALS: Goals reviewed with patient? Yes  SHORT TERM GOALS: Target date: 01/15/23  Patient will be independent with initial HEP.  Goal status: Met 12/21/22  2.  Patient will report centralization of radicular symptoms.  Baseline: radiating pain in to leg Goal status: ongoing  3.  Patient will demonstrate TUG < 15s  Baseline:  19.01  Goal status: 14.82 Met  01/09/23    LONG TERM GOALS: Target date: 02/26/23  Patient will be independent with advanced/ongoing HEP to improve outcomes and carryover.  Goal status: INITIAL  2.  Patient will report 75% improvement in low back pain to improve QOL.  Baseline: 7/10 Goal status: Progressing 01/09/23  3.  Patient will demonstrate full pain free lumbar ROM to perform ADLs.   Goal status: On going 01/09/23  4.  Patient to demonstrate ability to achieve and maintain good spinal alignment/posturing and body mechanics needed for daily activities. Baseline: forward head, kyphosis, posterior pelvis tilt Goal status: on going 01/09/23  PLAN:  PT FREQUENCY: 2x/week  PT DURATION: 12 weeks  PLANNED INTERVENTIONS: Therapeutic exercises, Therapeutic activity, Neuromuscular re-education, Balance training, Gait training, Patient/Family education, Self Care, Joint mobilization, Joint manipulation, Stair training, Dry Needling, Electrical stimulation, Spinal manipulation, Spinal mobilization, Cryotherapy, Moist heat, and  Manual therapy.  PLAN FOR NEXT SESSION: NuStep, postural exercises for upper back and pelvis. Needs full re-assessment soon, possible DC to home program?   Nedra Hai, PT, DPT 01/24/23 1:43 PM

## 2023-01-26 ENCOUNTER — Ambulatory Visit: Payer: Medicare Other

## 2023-01-30 ENCOUNTER — Ambulatory Visit: Payer: Medicare Other | Admitting: Physical Therapy

## 2023-01-30 ENCOUNTER — Encounter: Payer: Self-pay | Admitting: Physical Therapy

## 2023-01-30 DIAGNOSIS — R293 Abnormal posture: Secondary | ICD-10-CM

## 2023-01-30 DIAGNOSIS — M5459 Other low back pain: Secondary | ICD-10-CM | POA: Diagnosis not present

## 2023-01-30 DIAGNOSIS — M6281 Muscle weakness (generalized): Secondary | ICD-10-CM | POA: Diagnosis not present

## 2023-01-30 DIAGNOSIS — M5442 Lumbago with sciatica, left side: Secondary | ICD-10-CM | POA: Diagnosis not present

## 2023-01-30 NOTE — Therapy (Signed)
OUTPATIENT PHYSICAL THERAPY THORACOLUMBAR TREATMENT     Patient Name: Cameron Todd MRN: [id number] DOB:06/11/1933, 89 y.o., male Today's Date: 01/30/2023  END OF SESSION:  PT End of Session - 01/30/23 1515     Visit Number 16    Date for PT Re-Evaluation 02/26/23    PT Start Time 1515    PT Stop Time 1600    PT Time Calculation (min) 45 min    Activity Tolerance Patient tolerated treatment well    Behavior During Therapy WFL for tasks assessed/performed              Past Medical History:  Diagnosis Date   Arthritis    Barrett's esophagus    Compression fracture of spine (HCC)    T 10   Diabetes (HCC)    Elevated cholesterol    Gastric ulcer 1990   Hemorrhoids    History of colon polyps    Hypertension    Tubular adenoma of colon    Past Surgical History:  Procedure Laterality Date   cataract surgery bilateral     NO PAST SURGERIES     UPPER GASTROINTESTINAL ENDOSCOPY     Patient Active Problem List   Diagnosis Date Noted   Age related osteoporosis 11/09/2020   Cold intolerance 11/09/2020   Polyneuropathy 11/09/2020   Vitamin D deficiency 11/09/2020   Dorsalgia 11/09/2020   Controlled type 2 diabetes mellitus without complication, without long-term current use of insulin (HCC) 11/09/2020   Hypertensive heart and renal disease 11/09/2020   Benign prostatic hyperplasia with urinary hesitancy 11/09/2020   Barrett's esophagus 05/20/2013   HTN (hypertension) 05/20/2013   GENERALIZED OSTEOARTHROSIS UNSPECIFIED SITE 06/10/2008   EPIGASTRIC PAIN 06/10/2008   GASTRIC ULCER, HX OF 06/10/2008   INTERNAL HEMORRHOIDS 06/08/2008    PCP: Nadene Rubins  REFERRING PROVIDER: Coletta Memos  REFERRING DIAG:  M54.42 (ICD-10-CM) - Lumbago with sciatica, left side    Rationale for Evaluation and Treatment: Rehabilitation  THERAPY DIAG:  Abnormal posture  Other low back pain  Left-sided low back pain with left-sided sciatica, unspecified chronicity  ONSET  DATE: 11/27/22  SUBJECTIVE:                                                                                                                                                                                           SUBJECTIVE STATEMENT: Doing all right, I think it is that's joint (Pt points to L hip)   PERTINENT HISTORY:  No surgical hx, arthritis   PAIN:  Are you having pain? Yes: NPRS scale: 6/10 Pain location: L hip down my leg, low back Pain description: achy, sharp, radiating  down leg, constant and never goes away, numbness Aggravating factors: standing, walking Relieving factors: just the medicine but only for a short time  PRECAUTIONS: None  WEIGHT BEARING RESTRICTIONS: No  FALLS:  Has patient fallen in last 6 months? No  LIVING ENVIRONMENT: Lives with: lives with their spouse Lives in: House/apartment Stairs: Yes: External: 3-4 steps; on left going up Has following equipment at home: None  OCCUPATION: Retired  PLOF: Independent and Independent with basic ADLs  PATIENT GOALS: some how get rid of the pain in my back and leg   NEXT MD VISIT: 12/11/22- spine doctor for injection  OBJECTIVE:   DIAGNOSTIC FINDINGS:  IMPRESSION: 1. Left lateral recess narrowing at L4-L5 and L5-S1, which may be a source of left L5 and/or S1 radiculopathy. 2. No central spinal canal or neural foraminal stenosis. 3. Chronic compression deformity of T11 with mild narrowing of the ventral thecal sac.   SCREENING FOR RED FLAGS: Bowel or bladder incontinence: No Spinal tumors: No Cauda equina syndrome: No Compression fracture: No Abdominal aneurysm: No  COGNITION: Overall cognitive status: Within functional limits for tasks assessed     SENSATION: WFL  MUSCLE LENGTH: Hamstrings: extremely tight in BLE  POSTURE: rounded shoulders, forward head, increased thoracic kyphosis, posterior pelvic tilt, and flexed trunk   PALPATION: TTP SIJ, L4-L5  LUMBAR ROM:   AROM eval 12/21/22  01/09/23  Flexion 75% pain in low back [facility]  Extension 25% with pain Limited 50% with pain Limuted 25%  Right lateral flexion Fib head  WFL WFL  Left lateral flexion Fib head, some pain WFL with pain WFL  Right rotation 25% with pain Limited 75% Limited 50%  Left rotation 25% with pain Limited 75% Limited 50%   (Blank rows = not tested)  LOWER EXTREMITY ROM:   grossly WFL    LOWER EXTREMITY MMT:  grossly 4-4+/5    LUMBAR SPECIAL TESTS:  Straight leg raise test: Positive and Slump test: Negative  FUNCTIONAL TESTS:  5 times sit to stand: 14.34s Timed up and go (TUG): 19.01s  GAIT: Distance walked: in clinic distances Assistive device utilized: None Level of assistance: Modified independence Comments: flexed trunk, decreased foot clearance, forward head, kyphotic posture, increased posterior pelvic tilt, slowed speed   TODAY'S TREATMENT:                                                                                                                              DATE:  01/30/23 NuStep L5 x 7 min Resisted side steps 20lb x 5 each S2S OHP yellow ball 2x10 Seated Trunk rotations yellow ball x10  Seated Rows and Lats 25lb 2x10  Shoulder Ext blue 2x10  01/24/23 TherEx  UBE L1 backwards only for postural muscle activation x6 minutes Rows 25# 2x10  Trunk extensions against wall x3- limited by pain LLE  Thoracic extensions seated x10 Thoracic lateral flexion x10 B Thoracic rotation x10 B HS stretches 3x30 seconds B Piriformis stretch  2x30 seconds B  Lumbar rotation stretch 5x5 seconds B Attempted bridges- unable to tolerate due to pain QL stretch 3x15 seconds     01/23/23 NuStep L 5 x 7 min S2S OHP yellow ball 2x10 Standing rows 10lb 2x10 W backs Trunk rotations Shoulder Ext 5lb 2x10  MHP on lumbar spine  Passive stretching to HS, piriformis, Glute, ITB    01/18/23 NuStep L 6 x 7 min S2S OHP form elevated surface 2x10 Facing wall overhead ball Ext  Back against  wall overhead Ext HS curls 25lb 2x10 Leg Ext 10lb 2x10 Shoulder Ext 5lb 2x10  MHP on lumbar spine  Bridges   SLR  Piriformis stretch   01/15/23 NuStep L 6 x 7 min Gait 40 feet Shoulder flex for posture 2lb WaTE 2x10 Standing kickbacks 5lbs x10 B Standing arrowheads with green t-band 2x15 Standing horizontal abd with red t-band 2x15 Standing rows with blue t-band 2x15 MHP to hips/low back in supine; legs bent and 3 pillows under head  01/11/23 NuStep L 6 x 7 min UBE L2 x 2 min each Shoulder flex for posture 2lb WaTE 2x10 Standing OHP yellow ball 2x10 Standing rows & Ext green 2x12 4in forward and lateral step ups  Gait 1 lap ~100 feet    01/09/23 NuStep L 5 x 6 min CHECKED GOALS Rows & Lats 35lb 2x10 Lumbar Ext Black band 3x10 Hip Ext 5lb x 10 each MHP to lumbar spine  Bridges  SLR x10 each   Ball squeeze and bridge   01/04/23 NuStep L 5 x 6 min UBE L1 x2 min each Leg press 20lb 2x10 Rows & Lats 25lb 2x12 Lumbar Ext Black band 2x10 Horiz shoulder abd back against wall with half foam roll red 2x10 MHP to lumbar spine and R shoulder x10 min    PATIENT EDUCATION:  Education details: POC and HEP Person educated: Patient and Child(ren) Education method: Explanation Education comprehension: verbalized understanding  HOME EXERCISE PROGRAM: Access Code: [email or web address] URL: [email or web address] Date: 12/04/2022 Prepared by: Cassie Freer  Exercises - Supine Lower Trunk Rotation  - 1 x daily - 7 x weekly - 2 sets - 10 reps - Supine Bridge  - 1 x daily - 7 x weekly - 2 sets - 10 reps - Supine Single Knee to Chest Stretch  - 2 x daily - 7 x weekly - 30 hold - Supine Active Straight Leg Raise  - 1 x daily - 7 x weekly - 2 sets - 10 reps - Seated Hamstring Stretch  - 2 x daily - 7 x weekly - 30 hold  ASSESSMENT:  CLINICAL IMPRESSION:  Mr. Agosto arrives today doing OK, He reports improved mobility but continues to have pain. We spent some time working  further on postural mechanics and strengthening. He did really well with the addition of resisted side steps. Postural cues needed with shoulder extensions.  OBJECTIVE IMPAIRMENTS: Abnormal gait, difficulty walking, decreased strength, improper body mechanics, postural dysfunction, and pain.   ACTIVITY LIMITATIONS: bending, sitting, standing, stairs, and locomotion level   REHAB POTENTIAL: Good  CLINICAL DECISION MAKING: Stable/uncomplicated  EVALUATION COMPLEXITY: Low  GOALS: Goals reviewed with patient? Yes  SHORT TERM GOALS: Target date: 01/15/23  Patient will be independent with initial HEP.  Goal status: Met 12/21/22  2.  Patient will report centralization of radicular symptoms.  Baseline: radiating pain in to leg Goal status: ongoing  3.  Patient will demonstrate TUG < 15s  Baseline: 19.01  Goal status: 14.82 Met  01/09/23  LONG TERM GOALS: Target date: 02/26/23  Patient will be independent with advanced/ongoing HEP to improve outcomes and carryover.  Goal status: INITIAL  2.  Patient will report 75% improvement in low back pain to improve QOL.  Baseline: 7/10 Goal status: Progressing 01/09/23  3.  Patient will demonstrate full pain free lumbar ROM to perform ADLs.   Goal status: On going 01/09/23  4.  Patient to demonstrate ability to achieve and maintain good spinal alignment/posturing and body mechanics needed for daily activities. Baseline: forward head, kyphosis, posterior pelvis tilt Goal status: on going 01/09/23  PLAN:  PT FREQUENCY: 2x/week  PT DURATION: 12 weeks  PLANNED INTERVENTIONS: Therapeutic exercises, Therapeutic activity, Neuromuscular re-education, Balance training, Gait training, Patient/Family education, Self Care, Joint mobilization, Joint manipulation, Stair training, Dry Needling, Electrical stimulation, Spinal manipulation, Spinal mobilization, Cryotherapy, Moist heat, and Manual therapy.  PLAN FOR NEXT SESSION: NuStep, postural  exercises for upper back and pelvis. Needs full re-assessment soon, possible DC to home program?   Nedra Hai, PT, DPT 01/30/23 3:16 PM

## 2023-02-01 ENCOUNTER — Ambulatory Visit: Payer: Medicare Other | Admitting: Physical Therapy

## 2023-02-01 ENCOUNTER — Encounter: Payer: Self-pay | Admitting: Physical Therapy

## 2023-02-01 DIAGNOSIS — M5459 Other low back pain: Secondary | ICD-10-CM

## 2023-02-01 DIAGNOSIS — M6281 Muscle weakness (generalized): Secondary | ICD-10-CM

## 2023-02-01 DIAGNOSIS — R293 Abnormal posture: Secondary | ICD-10-CM

## 2023-02-01 DIAGNOSIS — M5442 Lumbago with sciatica, left side: Secondary | ICD-10-CM

## 2023-02-01 NOTE — Therapy (Signed)
OUTPATIENT PHYSICAL THERAPY THORACOLUMBAR TREATMENT     Patient Name: Cameron Todd MRN: [id number] DOB:[birth year], 89 y.o., male Today's Date: 02/01/2023  END OF SESSION:  PT End of Session - 02/01/23 1521     Visit Number 17    Date for PT Re-Evaluation 02/26/23    PT Start Time 1521    PT Stop Time 1600    PT Time Calculation (min) 39 min    Activity Tolerance Patient tolerated treatment well    Behavior During Therapy WFL for tasks assessed/performed              Past Medical History:  Diagnosis Date   Arthritis    Barrett's esophagus    Compression fracture of spine (HCC)    T 10   Diabetes (HCC)    Elevated cholesterol    Gastric ulcer 1990   Hemorrhoids    History of colon polyps    Hypertension    Tubular adenoma of colon    Past Surgical History:  Procedure Laterality Date   cataract surgery bilateral     NO PAST SURGERIES     UPPER GASTROINTESTINAL ENDOSCOPY     Patient Active Problem List   Diagnosis Date Noted   Age related osteoporosis 11/09/2020   Cold intolerance 11/09/2020   Polyneuropathy 11/09/2020   Vitamin D deficiency 11/09/2020   Dorsalgia 11/09/2020   Controlled type 2 diabetes mellitus without complication, without long-term current use of insulin (HCC) 11/09/2020   Hypertensive heart and renal disease 11/09/2020   Benign prostatic hyperplasia with urinary hesitancy 11/09/2020   Barrett's esophagus 05/20/2013   HTN (hypertension) 05/20/2013   GENERALIZED OSTEOARTHROSIS UNSPECIFIED SITE 06/10/2008   EPIGASTRIC PAIN 06/10/2008   GASTRIC ULCER, HX OF 06/10/2008   INTERNAL HEMORRHOIDS 06/08/2008    PCP: Nadene Rubins  REFERRING PROVIDER: Coletta Memos  REFERRING DIAG:  M54.42 (ICD-10-CM) - Lumbago with sciatica, left side    Rationale for Evaluation and Treatment: Rehabilitation  THERAPY DIAG:  Abnormal posture  Left-sided low back pain with left-sided sciatica, unspecified chronicity  Muscle weakness  (generalized)  Other low back pain  ONSET DATE: 11/27/22  SUBJECTIVE:                                                                                                                                                                                           SUBJECTIVE STATEMENT: All right   PERTINENT HISTORY:  No surgical hx, arthritis   PAIN:  Are you having pain? Yes: NPRS scale: 5/10 Pain location: L hip down my leg, low back Pain description: achy, sharp, radiating down leg, constant and never goes away, numbness  Aggravating factors: standing, walking Relieving factors: just the medicine but only for a short time  PRECAUTIONS: None  WEIGHT BEARING RESTRICTIONS: No  FALLS:  Has patient fallen in last 6 months? No  LIVING ENVIRONMENT: Lives with: lives with their spouse Lives in: House/apartment Stairs: Yes: External: 3-4 steps; on left going up Has following equipment at home: None  OCCUPATION: Retired  PLOF: Independent and Independent with basic ADLs  PATIENT GOALS: some how get rid of the pain in my back and leg   NEXT MD VISIT: 12/11/22- spine doctor for injection  OBJECTIVE:   DIAGNOSTIC FINDINGS:  IMPRESSION: 1. Left lateral recess narrowing at L4-L5 and L5-S1, which may be a source of left L5 and/or S1 radiculopathy. 2. No central spinal canal or neural foraminal stenosis. 3. Chronic compression deformity of T11 with mild narrowing of the ventral thecal sac.   SCREENING FOR RED FLAGS: Bowel or bladder incontinence: No Spinal tumors: No Cauda equina syndrome: No Compression fracture: No Abdominal aneurysm: No  COGNITION: Overall cognitive status: Within functional limits for tasks assessed     SENSATION: WFL  MUSCLE LENGTH: Hamstrings: extremely tight in BLE  POSTURE: rounded shoulders, forward head, increased thoracic kyphosis, posterior pelvic tilt, and flexed trunk   PALPATION: TTP SIJ, L4-L5  LUMBAR ROM:   AROM eval 12/21/22 01/09/23  02/01/23  Flexion 75% pain in low back [facility] WFL  Extension 25% with pain Limited 50% with pain Limited 25% Limited 25%  Right lateral flexion Fib head  [facility] WFL  Left lateral flexion Fib head, some pain WFL with pain WFL WFL  Right rotation 25% with pain Limited 75% Limited 50% Limited 50%  Left rotation 25% with pain Limited 75% Limited 50% Limited 25%   (Blank rows = not tested)  LOWER EXTREMITY ROM:   grossly WFL    LOWER EXTREMITY MMT:  grossly 4-4+/5    LUMBAR SPECIAL TESTS:  Straight leg raise test: Positive and Slump test: Negative  FUNCTIONAL TESTS:  5 times sit to stand: 14.34s, 02/01/23 =11.33 sec Timed up and go (TUG): 19.01s, 02/01/23 = 14.28 sec  GAIT: Distance walked: in clinic distances Assistive device utilized: None Level of assistance: Modified independence Comments: flexed trunk, decreased foot clearance, forward head, kyphotic posture, increased posterior pelvic tilt, slowed speed   TODAY'S TREATMENT:                                                                                                                              DATE:  02/01/23 NuStep L 5 x7 min HS curls 35lb 2x10 Leg Ext 10lb 2x10 S2S OHP yellow ball 2x10 Standing shoulder Ext 2lb 2x10 Seated Trunk rotations yellow ball x10    01/30/23 NuStep L5 x 7 min Resisted side steps 20lb x 5 each S2S OHP yellow ball 2x10 Seated Trunk rotations yellow ball x10  Seated Rows and Lats 25lb 2x10  Shoulder Ext blue 2x10  01/24/23 TherEx  UBE L1  backwards only for postural muscle activation x6 minutes Rows 25# 2x10  Trunk extensions against wall x3- limited by pain LLE  Thoracic extensions seated x10 Thoracic lateral flexion x10 B Thoracic rotation x10 B HS stretches 3x30 seconds B Piriformis stretch 2x30 seconds B  Lumbar rotation stretch 5x5 seconds B Attempted bridges- unable to tolerate due to pain QL stretch 3x15 seconds     01/23/23 NuStep L 5 x 7 min S2S OHP yellow ball  2x10 Standing rows 10lb 2x10 W backs Trunk rotations Shoulder Ext 5lb 2x10  MHP on lumbar spine  Passive stretching to HS, piriformis, Glute, ITB    01/18/23 NuStep L 6 x 7 min S2S OHP form elevated surface 2x10 Facing wall overhead ball Ext  Back against wall overhead Ext HS curls 25lb 2x10 Leg Ext 10lb 2x10 Shoulder Ext 5lb 2x10  MHP on lumbar spine  Bridges   SLR  Piriformis stretch   01/15/23 NuStep L 6 x 7 min Gait 40 feet Shoulder flex for posture 2lb WaTE 2x10 Standing kickbacks 5lbs x10 B Standing arrowheads with green t-band 2x15 Standing horizontal abd with red t-band 2x15 Standing rows with blue t-band 2x15 MHP to hips/low back in supine; legs bent and 3 pillows under head  01/11/23 NuStep L 6 x 7 min UBE L2 x 2 min each Shoulder flex for posture 2lb WaTE 2x10 Standing OHP yellow ball 2x10 Standing rows & Ext green 2x12 4in forward and lateral step ups  Gait 1 lap ~100 feet    01/09/23 NuStep L 5 x 6 min CHECKED GOALS Rows & Lats 35lb 2x10 Lumbar Ext Black band 3x10 Hip Ext 5lb x 10 each MHP to lumbar spine  Bridges  SLR x10 each   Ball squeeze and bridge   01/04/23 NuStep L 5 x 6 min UBE L1 x2 min each Leg press 20lb 2x10 Rows & Lats 25lb 2x12 Lumbar Ext Black band 2x10 Horiz shoulder abd back against wall with half foam roll red 2x10 MHP to lumbar spine and R shoulder x10 min    PATIENT EDUCATION:  Education details: POC and HEP Person educated: Patient and Child(ren) Education method: Explanation Education comprehension: verbalized understanding  HOME EXERCISE PROGRAM: Access Code: [email or web address] URL: [email or web address] Date: 12/04/2022 Prepared by: Cassie Freer  Exercises - Supine Lower Trunk Rotation  - 1 x daily - 7 x weekly - 2 sets - 10 reps - Supine Bridge  - 1 x daily - 7 x weekly - 2 sets - 10 reps - Supine Single Knee to Chest Stretch  - 2 x daily - 7 x weekly - 30 hold - Supine Active Straight Leg Raise  -  1 x daily - 7 x weekly - 2 sets - 10 reps - Seated Hamstring Stretch  - 2 x daily - 7 x weekly - 30 hold  ASSESSMENT:  CLINICAL IMPRESSION:  Cameron Todd arrives today doing OK ~ 6 minutes late, He has progressed decreasing his 5x sit to stnd and TUG times.  We continues to spend  time working further on postural mechanics and strengthening. Slight improvement noted with lumbar ROM. Fatigue reported after sit to stands.  Postural cues needed with shoulder extensions. Pt had progressed functionally but continues to have pain.  OBJECTIVE IMPAIRMENTS: Abnormal gait, difficulty walking, decreased strength, improper body mechanics, postural dysfunction, and pain.   ACTIVITY LIMITATIONS: bending, sitting, standing, stairs, and locomotion level   REHAB POTENTIAL: Good  CLINICAL DECISION MAKING: Stable/uncomplicated  EVALUATION COMPLEXITY: Low  GOALS: Goals  reviewed with patient? Yes  SHORT TERM GOALS: Target date: 01/15/23  Patient will be independent with initial HEP.  Goal status: Met 12/21/22  2.  Patient will report centralization of radicular symptoms.  Baseline: radiating pain in to leg Goal status: ongoing  3.  Patient will demonstrate TUG < 15s  Baseline: 19.01  Goal status: 14.82 Met  01/09/23    LONG TERM GOALS: Target date: 02/26/23  Patient will be independent with advanced/ongoing HEP to improve outcomes and carryover.  Goal status: INITIAL  2.  Patient will report 75% improvement in low back pain to improve QOL.  Baseline: 7/10 Goal status: Progressing 02/01/23  3.  Patient will demonstrate full pain free lumbar ROM to perform ADLs.   Goal status: On going 01/09/23  4.  Patient to demonstrate ability to achieve and maintain good spinal alignment/posturing and body mechanics needed for daily activities. Baseline: forward head, kyphosis, posterior pelvis tilt Goal status: on going 02/01/23  PLAN:  PT FREQUENCY: 2x/week  PT DURATION: 12 weeks  PLANNED  INTERVENTIONS: Therapeutic exercises, Therapeutic activity, Neuromuscular re-education, Balance training, Gait training, Patient/Family education, Self Care, Joint mobilization, Joint manipulation, Stair training, Dry Needling, Electrical stimulation, Spinal manipulation, Spinal mobilization, Cryotherapy, Moist heat, and Manual therapy.  PLAN FOR NEXT SESSION: NuStep, postural exercises for upper back and pelvis. Needs full re-assessment soon, possible DC to home program?   Nedra Hai, PT, DPT 02/01/23 3:22 PM

## 2023-02-06 ENCOUNTER — Encounter: Payer: Self-pay | Admitting: Physical Therapy

## 2023-02-06 ENCOUNTER — Ambulatory Visit: Payer: Medicare Other | Admitting: Physical Therapy

## 2023-02-06 DIAGNOSIS — M5442 Lumbago with sciatica, left side: Secondary | ICD-10-CM | POA: Diagnosis not present

## 2023-02-06 DIAGNOSIS — R293 Abnormal posture: Secondary | ICD-10-CM

## 2023-02-06 DIAGNOSIS — M6281 Muscle weakness (generalized): Secondary | ICD-10-CM | POA: Diagnosis not present

## 2023-02-06 DIAGNOSIS — M5459 Other low back pain: Secondary | ICD-10-CM

## 2023-02-06 NOTE — Therapy (Signed)
OUTPATIENT PHYSICAL THERAPY THORACOLUMBAR TREATMENT     Patient Name: LEEVON UPPERMAN MRN: [id number] DOB:30-Jun-1933, 89 y.o., male Today's Date: 02/06/2023  END OF SESSION:  PT End of Session - 02/06/23 1515     Visit Number 18    Date for PT Re-Evaluation 02/26/23    PT Start Time 1515    PT Stop Time 1600    PT Time Calculation (min) 45 min    Activity Tolerance Patient tolerated treatment well    Behavior During Therapy WFL for tasks assessed/performed              Past Medical History:  Diagnosis Date   Arthritis    Barrett's esophagus    Compression fracture of spine (HCC)    T 10   Diabetes (HCC)    Elevated cholesterol    Gastric ulcer 1990   Hemorrhoids    History of colon polyps    Hypertension    Tubular adenoma of colon    Past Surgical History:  Procedure Laterality Date   cataract surgery bilateral     NO PAST SURGERIES     UPPER GASTROINTESTINAL ENDOSCOPY     Patient Active Problem List   Diagnosis Date Noted   Age related osteoporosis 11/09/2020   Cold intolerance 11/09/2020   Polyneuropathy 11/09/2020   Vitamin D deficiency 11/09/2020   Dorsalgia 11/09/2020   Controlled type 2 diabetes mellitus without complication, without long-term current use of insulin (HCC) 11/09/2020   Hypertensive heart and renal disease 11/09/2020   Benign prostatic hyperplasia with urinary hesitancy 11/09/2020   Barrett's esophagus 05/20/2013   HTN (hypertension) 05/20/2013   GENERALIZED OSTEOARTHROSIS UNSPECIFIED SITE 06/10/2008   EPIGASTRIC PAIN 06/10/2008   GASTRIC ULCER, HX OF 06/10/2008   INTERNAL HEMORRHOIDS 06/08/2008    PCP: Nadene Rubins  REFERRING PROVIDER: Coletta Memos  REFERRING DIAG:  M54.42 (ICD-10-CM) - Lumbago with sciatica, left side    Rationale for Evaluation and Treatment: Rehabilitation  THERAPY DIAG:  Abnormal posture  Left-sided low back pain with left-sided sciatica, unspecified chronicity  Muscle weakness  (generalized)  Other low back pain  ONSET DATE: 11/27/22  SUBJECTIVE:                                                                                                                                                                                           SUBJECTIVE STATEMENT: Fairly good   PERTINENT HISTORY:  No surgical hx, arthritis   PAIN:  Are you having pain? Yes: NPRS scale: 5/10 Pain location: L hip down my leg, low back Pain description: achy, sharp, radiating down leg, constant and never goes away, numbness  Aggravating factors: standing, walking Relieving factors: just the medicine but only for a short time  PRECAUTIONS: None  WEIGHT BEARING RESTRICTIONS: No  FALLS:  Has patient fallen in last 6 months? No  LIVING ENVIRONMENT: Lives with: lives with their spouse Lives in: House/apartment Stairs: Yes: External: 3-4 steps; on left going up Has following equipment at home: None  OCCUPATION: Retired  PLOF: Independent and Independent with basic ADLs  PATIENT GOALS: some how get rid of the pain in my back and leg   NEXT MD VISIT: 12/11/22- spine doctor for injection  OBJECTIVE:   DIAGNOSTIC FINDINGS:  IMPRESSION: 1. Left lateral recess narrowing at L4-L5 and L5-S1, which may be a source of left L5 and/or S1 radiculopathy. 2. No central spinal canal or neural foraminal stenosis. 3. Chronic compression deformity of T11 with mild narrowing of the ventral thecal sac.   SCREENING FOR RED FLAGS: Bowel or bladder incontinence: No Spinal tumors: No Cauda equina syndrome: No Compression fracture: No Abdominal aneurysm: No  COGNITION: Overall cognitive status: Within functional limits for tasks assessed     SENSATION: WFL  MUSCLE LENGTH: Hamstrings: extremely tight in BLE  POSTURE: rounded shoulders, forward head, increased thoracic kyphosis, posterior pelvic tilt, and flexed trunk   PALPATION: TTP SIJ, L4-L5  LUMBAR ROM:   AROM eval 12/21/22 01/09/23  02/01/23  Flexion 75% pain in low back [facility] WFL  Extension 25% with pain Limited 50% with pain Limited 25% Limited 25%  Right lateral flexion Fib head  [facility] WFL  Left lateral flexion Fib head, some pain WFL with pain WFL WFL  Right rotation 25% with pain Limited 75% Limited 50% Limited 50%  Left rotation 25% with pain Limited 75% Limited 50% Limited 25%   (Blank rows = not tested)  LOWER EXTREMITY ROM:   grossly WFL    LOWER EXTREMITY MMT:  grossly 4-4+/5    LUMBAR SPECIAL TESTS:  Straight leg raise test: Positive and Slump test: Negative  FUNCTIONAL TESTS:  5 times sit to stand: 14.34s, 02/01/23 =11.33 sec Timed up and go (TUG): 19.01s, 02/01/23 = 14.28 sec  GAIT: Distance walked: in clinic distances Assistive device utilized: None Level of assistance: Modified independence Comments: flexed trunk, decreased foot clearance, forward head, kyphotic posture, increased posterior pelvic tilt, slowed speed   TODAY'S TREATMENT:                                                                                                                              DATE:  02/06/23 NuStep L5 x 8 min S2S OHP blue ball 2x10 Slant board calf stretch 4x10'' Shoulder Ext 10lb 2x10 for core  HS curls 35lb 2x10 Leg Ext 10lb 2x10 Horiz Abd green 2x10   02/01/23 NuStep L 5 x7 min HS curls 35lb 2x10 Leg Ext 10lb 2x10 S2S OHP yellow ball 2x10 Standing shoulder Ext 2lb 2x10 Seated Trunk rotations yellow ball x10    01/30/23 NuStep L5 x 7  min Resisted side steps 20lb x 5 each S2S OHP yellow ball 2x10 Seated Trunk rotations yellow ball x10  Seated Rows and Lats 25lb 2x10  Shoulder Ext blue 2x10  01/24/23 TherEx  UBE L1 backwards only for postural muscle activation x6 minutes Rows 25# 2x10  Trunk extensions against wall x3- limited by pain LLE  Thoracic extensions seated x10 Thoracic lateral flexion x10 B Thoracic rotation x10 B HS stretches 3x30 seconds B Piriformis stretch 2x30 seconds  B  Lumbar rotation stretch 5x5 seconds B Attempted bridges- unable to tolerate due to pain QL stretch 3x15 seconds     01/23/23 NuStep L 5 x 7 min S2S OHP yellow ball 2x10 Standing rows 10lb 2x10 W backs Trunk rotations Shoulder Ext 5lb 2x10  MHP on lumbar spine  Passive stretching to HS, piriformis, Glute, ITB    01/18/23 NuStep L 6 x 7 min S2S OHP form elevated surface 2x10 Facing wall overhead ball Ext  Back against wall overhead Ext HS curls 25lb 2x10 Leg Ext 10lb 2x10 Shoulder Ext 5lb 2x10  MHP on lumbar spine  Bridges   SLR  Piriformis stretch   01/15/23 NuStep L 6 x 7 min Gait 40 feet Shoulder flex for posture 2lb WaTE 2x10 Standing kickbacks 5lbs x10 B Standing arrowheads with green t-band 2x15 Standing horizontal abd with red t-band 2x15 Standing rows with blue t-band 2x15 MHP to hips/low back in supine; legs bent and 3 pillows under head  01/11/23 NuStep L 6 x 7 min UBE L2 x 2 min each Shoulder flex for posture 2lb WaTE 2x10 Standing OHP yellow ball 2x10 Standing rows & Ext green 2x12 4in forward and lateral step ups  Gait 1 lap ~100 feet    01/09/23 NuStep L 5 x 6 min CHECKED GOALS Rows & Lats 35lb 2x10 Lumbar Ext Black band 3x10 Hip Ext 5lb x 10 each MHP to lumbar spine  Bridges  SLR x10 each   Ball squeeze and bridge   01/04/23 NuStep L 5 x 6 min UBE L1 x2 min each Leg press 20lb 2x10 Rows & Lats 25lb 2x12 Lumbar Ext Black band 2x10 Horiz shoulder abd back against wall with half foam roll red 2x10 MHP to lumbar spine and R shoulder x10 min    PATIENT EDUCATION:  Education details: POC and HEP Person educated: Patient and Child(ren) Education method: Explanation Education comprehension: verbalized understanding  HOME EXERCISE PROGRAM: Access Code: [email or web address] URL: [email or web address] Date: 12/04/2022 Prepared by: Cassie Freer  Exercises - Supine Lower Trunk Rotation  - 1 x daily - 7 x weekly - 2 sets - 10 reps -  Supine Bridge  - 1 x daily - 7 x weekly - 2 sets - 10 reps - Supine Single Knee to Chest Stretch  - 2 x daily - 7 x weekly - 30 hold - Supine Active Straight Leg Raise  - 1 x daily - 7 x weekly - 2 sets - 10 reps - Seated Hamstring Stretch  - 2 x daily - 7 x weekly - 30 hold  ASSESSMENT:  CLINICAL IMPRESSION:  Mr. Mohr arrives today doing OK  6 minutes late. We continues to spend time working further on postural mechanics and strengthening. Cues for core engagement needed with shoulder ext. Increase resistance tolerated with S2S OHP.  Fatigue reported after sit to stands.  No issue with resisted side steps. Pt reported that his pain is slightly better.   OBJECTIVE IMPAIRMENTS: Abnormal gait, difficulty walking, decreased strength, improper body mechanics,  postural dysfunction, and pain.   ACTIVITY LIMITATIONS: bending, sitting, standing, stairs, and locomotion level   REHAB POTENTIAL: Good  CLINICAL DECISION MAKING: Stable/uncomplicated  EVALUATION COMPLEXITY: Low  GOALS: Goals reviewed with patient? Yes  SHORT TERM GOALS: Target date: 01/15/23  Patient will be independent with initial HEP.  Goal status: Met 12/21/22  2.  Patient will report centralization of radicular symptoms.  Baseline: radiating pain in to leg Goal status: ongoing  3.  Patient will demonstrate TUG < 15s  Baseline: 19.01  Goal status: 14.82 Met  01/09/23    LONG TERM GOALS: Target date: 02/26/23  Patient will be independent with advanced/ongoing HEP to improve outcomes and carryover.  Goal status: INITIAL  2.  Patient will report 75% improvement in low back pain to improve QOL.  Baseline: 7/10 Goal status: Progressing 02/01/23  3.  Patient will demonstrate full pain free lumbar ROM to perform ADLs.   Goal status: On going 01/09/23  4.  Patient to demonstrate ability to achieve and maintain good spinal alignment/posturing and body mechanics needed for daily activities. Baseline: forward head,  kyphosis, posterior pelvis tilt Goal status: on going 02/01/23  PLAN:  PT FREQUENCY: 2x/week  PT DURATION: 12 weeks  PLANNED INTERVENTIONS: Therapeutic exercises, Therapeutic activity, Neuromuscular re-education, Balance training, Gait training, Patient/Family education, Self Care, Joint mobilization, Joint manipulation, Stair training, Dry Needling, Electrical stimulation, Spinal manipulation, Spinal mobilization, Cryotherapy, Moist heat, and Manual therapy.  PLAN FOR NEXT SESSION: NuStep, postural exercises for upper back and pelvis. Needs full re-assessment soon, possible DC to home program?   Nedra Hai, PT, DPT 02/06/23 3:18 PM

## 2023-02-07 NOTE — Therapy (Signed)
OUTPATIENT PHYSICAL THERAPY THORACOLUMBAR TREATMENT     Patient Name: Cameron Todd MRN: [id number] DOB:12-May-1933, 89 y.o., male Today's Date: 02/08/2023  END OF SESSION:  PT End of Session - 02/08/23 1502     Visit Number 19    Date for PT Re-Evaluation 02/26/23    PT Start Time 1500    PT Stop Time 1545    PT Time Calculation (min) 45 min    Activity Tolerance Patient tolerated treatment well    Behavior During Therapy WFL for tasks assessed/performed               Past Medical History:  Diagnosis Date   Arthritis    Barrett's esophagus    Compression fracture of spine (HCC)    T 10   Diabetes (HCC)    Elevated cholesterol    Gastric ulcer 1990   Hemorrhoids    History of colon polyps    Hypertension    Tubular adenoma of colon    Past Surgical History:  Procedure Laterality Date   cataract surgery bilateral     NO PAST SURGERIES     UPPER GASTROINTESTINAL ENDOSCOPY     Patient Active Problem List   Diagnosis Date Noted   Age related osteoporosis 11/09/2020   Cold intolerance 11/09/2020   Polyneuropathy 11/09/2020   Vitamin D deficiency 11/09/2020   Dorsalgia 11/09/2020   Controlled type 2 diabetes mellitus without complication, without long-term current use of insulin (HCC) 11/09/2020   Hypertensive heart and renal disease 11/09/2020   Benign prostatic hyperplasia with urinary hesitancy 11/09/2020   Barrett's esophagus 05/20/2013   HTN (hypertension) 05/20/2013   GENERALIZED OSTEOARTHROSIS UNSPECIFIED SITE 06/10/2008   EPIGASTRIC PAIN 06/10/2008   GASTRIC ULCER, HX OF 06/10/2008   INTERNAL HEMORRHOIDS 06/08/2008    PCP: Nadene Rubins  REFERRING PROVIDER: Coletta Memos  REFERRING DIAG:  M54.42 (ICD-10-CM) - Lumbago with sciatica, left side    Rationale for Evaluation and Treatment: Rehabilitation  THERAPY DIAG:  Abnormal posture  Left-sided low back pain with left-sided sciatica, unspecified chronicity  Muscle weakness  (generalized)  Other low back pain  ONSET DATE: 11/27/22  SUBJECTIVE:                                                                                                                                                                                           SUBJECTIVE STATEMENT: Doing pretty good, still having some pain in the left side. I need all the medicine, if I don't take them it tells me.    PERTINENT HISTORY:  No surgical hx, arthritis   PAIN:  Are you having pain? Yes: NPRS  scale: 5/10 Pain location: L hip down my leg, low back Pain description: achy, sharp, radiating down leg, constant and never goes away, numbness Aggravating factors: standing, walking Relieving factors: just the medicine but only for a short time  PRECAUTIONS: None  WEIGHT BEARING RESTRICTIONS: No  FALLS:  Has patient fallen in last 6 months? No  LIVING ENVIRONMENT: Lives with: lives with their spouse Lives in: House/apartment Stairs: Yes: External: 3-4 steps; on left going up Has following equipment at home: None  OCCUPATION: Retired  PLOF: Independent and Independent with basic ADLs  PATIENT GOALS: some how get rid of the pain in my back and leg   NEXT MD VISIT: 12/11/22- spine doctor for injection  OBJECTIVE:   DIAGNOSTIC FINDINGS:  IMPRESSION: 1. Left lateral recess narrowing at L4-L5 and L5-S1, which may be a source of left L5 and/or S1 radiculopathy. 2. No central spinal canal or neural foraminal stenosis. 3. Chronic compression deformity of T11 with mild narrowing of the ventral thecal sac.   SCREENING FOR RED FLAGS: Bowel or bladder incontinence: No Spinal tumors: No Cauda equina syndrome: No Compression fracture: No Abdominal aneurysm: No  COGNITION: Overall cognitive status: Within functional limits for tasks assessed     SENSATION: WFL  MUSCLE LENGTH: Hamstrings: extremely tight in BLE  POSTURE: rounded shoulders, forward head, increased thoracic kyphosis,  posterior pelvic tilt, and flexed trunk   PALPATION: TTP SIJ, L4-L5  LUMBAR ROM:   AROM eval 12/21/22 01/09/23 02/01/23  Flexion 75% pain in low back [facility] WFL  Extension 25% with pain Limited 50% with pain Limited 25% Limited 25%  Right lateral flexion Fib head  [facility] WFL  Left lateral flexion Fib head, some pain WFL with pain WFL WFL  Right rotation 25% with pain Limited 75% Limited 50% Limited 50%  Left rotation 25% with pain Limited 75% Limited 50% Limited 25%   (Blank rows = not tested)  LOWER EXTREMITY ROM:   grossly WFL    LOWER EXTREMITY MMT:  grossly 4-4+/5    LUMBAR SPECIAL TESTS:  Straight leg raise test: Positive and Slump test: Negative  FUNCTIONAL TESTS:  5 times sit to stand: 14.34s, 02/01/23 =11.33 sec Timed up and go (TUG): 19.01s, 02/01/23 = 14.28 sec  GAIT: Distance walked: in clinic distances Assistive device utilized: None Level of assistance: Modified independence Comments: flexed trunk, decreased foot clearance, forward head, kyphotic posture, increased posterior pelvic tilt, slowed speed   TODAY'S TREATMENT:                                                                                                                              DATE:  02/08/23 NuStep L5 x8 mins  STS with shoulder flexion 2x10 2# WaTE  HS curls 35lb 2x10 Leg Ext 10lb 2x10 Seated Rows and Lats 25lb 2x10  blackTB ext 2x10 Horiz Abd green 2x10  02/06/23 NuStep L5 x 8 min S2S OHP blue ball 2x10 Slant board calf  stretch 4x10'' Shoulder Ext 10lb 2x10 for core  HS curls 35lb 2x10 Leg Ext 10lb 2x10 Horiz Abd green 2x10   02/01/23 NuStep L 5 x7 min HS curls 35lb 2x10 Leg Ext 10lb 2x10 S2S OHP yellow ball 2x10 Standing shoulder Ext 2lb 2x10 Seated Trunk rotations yellow ball x10    01/30/23 NuStep L5 x 7 min Resisted side steps 20lb x 5 each S2S OHP yellow ball 2x10 Seated Trunk rotations yellow ball x10  Seated Rows and Lats 25lb 2x10  Shoulder Ext blue  2x10  01/24/23 TherEx  UBE L1 backwards only for postural muscle activation x6 minutes Rows 25# 2x10  Trunk extensions against wall x3- limited by pain LLE  Thoracic extensions seated x10 Thoracic lateral flexion x10 B Thoracic rotation x10 B HS stretches 3x30 seconds B Piriformis stretch 2x30 seconds B  Lumbar rotation stretch 5x5 seconds B Attempted bridges- unable to tolerate due to pain QL stretch 3x15 seconds     01/23/23 NuStep L 5 x 7 min S2S OHP yellow ball 2x10 Standing rows 10lb 2x10 W backs Trunk rotations Shoulder Ext 5lb 2x10  MHP on lumbar spine  Passive stretching to HS, piriformis, Glute, ITB    01/18/23 NuStep L 6 x 7 min S2S OHP form elevated surface 2x10 Facing wall overhead ball Ext  Back against wall overhead Ext HS curls 25lb 2x10 Leg Ext 10lb 2x10 Shoulder Ext 5lb 2x10  MHP on lumbar spine  Bridges   SLR  Piriformis stretch   01/15/23 NuStep L 6 x 7 min Gait 40 feet Shoulder flex for posture 2lb WaTE 2x10 Standing kickbacks 5lbs x10 B Standing arrowheads with green t-band 2x15 Standing horizontal abd with red t-band 2x15 Standing rows with blue t-band 2x15 MHP to hips/low back in supine; legs bent and 3 pillows under head  01/11/23 NuStep L 6 x 7 min UBE L2 x 2 min each Shoulder flex for posture 2lb WaTE 2x10 Standing OHP yellow ball 2x10 Standing rows & Ext green 2x12 4in forward and lateral step ups  Gait 1 lap ~100 feet    01/09/23 NuStep L 5 x 6 min CHECKED GOALS Rows & Lats 35lb 2x10 Lumbar Ext Black band 3x10 Hip Ext 5lb x 10 each MHP to lumbar spine  Bridges  SLR x10 each   Ball squeeze and bridge   01/04/23 NuStep L 5 x 6 min UBE L1 x2 min each Leg press 20lb 2x10 Rows & Lats 25lb 2x12 Lumbar Ext Black band 2x10 Horiz shoulder abd back against wall with half foam roll red 2x10 MHP to lumbar spine and R shoulder x10 min    PATIENT EDUCATION:  Education details: POC and HEP Person educated: Patient and  Child(ren) Education method: Explanation Education comprehension: verbalized understanding  HOME EXERCISE PROGRAM: Access Code: [email or web address] URL: [email or web address] Date: 12/04/2022 Prepared by: Cassie Freer  Exercises - Supine Lower Trunk Rotation  - 1 x daily - 7 x weekly - 2 sets - 10 reps - Supine Bridge  - 1 x daily - 7 x weekly - 2 sets - 10 reps - Supine Single Knee to Chest Stretch  - 2 x daily - 7 x weekly - 30 hold - Supine Active Straight Leg Raise  - 1 x daily - 7 x weekly - 2 sets - 10 reps - Seated Hamstring Stretch  - 2 x daily - 7 x weekly - 30 hold  ASSESSMENT:  CLINICAL IMPRESSION: We continued to spend time working further on postural mechanics and  strengthening. Fatigue reported after sit to stands. Pt reported that his pain is slightly better and he is trying to stand up taller. He states he understands the goal here is to work on his posture to help alleviate his pain.    OBJECTIVE IMPAIRMENTS: Abnormal gait, difficulty walking, decreased strength, improper body mechanics, postural dysfunction, and pain.   ACTIVITY LIMITATIONS: bending, sitting, standing, stairs, and locomotion level   REHAB POTENTIAL: Good  CLINICAL DECISION MAKING: Stable/uncomplicated  EVALUATION COMPLEXITY: Low  GOALS: Goals reviewed with patient? Yes  SHORT TERM GOALS: Target date: 01/15/23  Patient will be independent with initial HEP.  Goal status: Met 12/21/22  2.  Patient will report centralization of radicular symptoms.  Baseline: radiating pain in to leg Goal status: ongoing  3.  Patient will demonstrate TUG < 15s  Baseline: 19.01  Goal status: 14.82 Met  01/09/23    LONG TERM GOALS: Target date: 02/26/23  Patient will be independent with advanced/ongoing HEP to improve outcomes and carryover.  Goal status: INITIAL  2.  Patient will report 75% improvement in low back pain to improve QOL.  Baseline: 7/10 Goal status: Progressing 02/01/23  3.  Patient  will demonstrate full pain free lumbar ROM to perform ADLs.   Goal status: On going 01/09/23  4.  Patient to demonstrate ability to achieve and maintain good spinal alignment/posturing and body mechanics needed for daily activities. Baseline: forward head, kyphosis, posterior pelvis tilt Goal status: on going 02/01/23  PLAN:  PT FREQUENCY: 2x/week  PT DURATION: 12 weeks  PLANNED INTERVENTIONS: Therapeutic exercises, Therapeutic activity, Neuromuscular re-education, Balance training, Gait training, Patient/Family education, Self Care, Joint mobilization, Joint manipulation, Stair training, Dry Needling, Electrical stimulation, Spinal manipulation, Spinal mobilization, Cryotherapy, Moist heat, and Manual therapy.  PLAN FOR NEXT SESSION: NuStep, postural exercises for upper back and pelvis. Needs full re-assessment soon, possible DC to home program?   Cassie Freer, PT, DPT 02/08/23 3:41 PM

## 2023-02-08 ENCOUNTER — Ambulatory Visit: Payer: Medicare Other

## 2023-02-08 DIAGNOSIS — M6281 Muscle weakness (generalized): Secondary | ICD-10-CM | POA: Diagnosis not present

## 2023-02-08 DIAGNOSIS — R293 Abnormal posture: Secondary | ICD-10-CM | POA: Diagnosis not present

## 2023-02-08 DIAGNOSIS — M5442 Lumbago with sciatica, left side: Secondary | ICD-10-CM | POA: Diagnosis not present

## 2023-02-08 DIAGNOSIS — M5459 Other low back pain: Secondary | ICD-10-CM | POA: Diagnosis not present

## 2023-02-12 NOTE — Therapy (Signed)
OUTPATIENT PHYSICAL THERAPY THORACOLUMBAR TREATMENT   Progress Note Reporting Period 01/09/23 to 02/13/23  See note below for Objective Data and Assessment of Progress/Goals.      Patient Name: Cameron Todd MRN: [id number] DOB:[birth year], 89 y.o., male Today's Date: 02/13/2023  END OF SESSION:  PT End of Session - 02/13/23 1503     Visit Number 20    Date for PT Re-Evaluation 02/26/23    PT Start Time 1500    PT Stop Time 1545    PT Time Calculation (min) 45 min    Activity Tolerance Patient tolerated treatment well    Behavior During Therapy WFL for tasks assessed/performed                Past Medical History:  Diagnosis Date   Arthritis    Barrett's esophagus    Compression fracture of spine (HCC)    T 10   Diabetes (HCC)    Elevated cholesterol    Gastric ulcer 1990   Hemorrhoids    History of colon polyps    Hypertension    Tubular adenoma of colon    Past Surgical History:  Procedure Laterality Date   cataract surgery bilateral     NO PAST SURGERIES     UPPER GASTROINTESTINAL ENDOSCOPY     Patient Active Problem List   Diagnosis Date Noted   Age related osteoporosis 11/09/2020   Cold intolerance 11/09/2020   Polyneuropathy 11/09/2020   Vitamin D deficiency 11/09/2020   Dorsalgia 11/09/2020   Controlled type 2 diabetes mellitus without complication, without long-term current use of insulin (HCC) 11/09/2020   Hypertensive heart and renal disease 11/09/2020   Benign prostatic hyperplasia with urinary hesitancy 11/09/2020   Barrett's esophagus 05/20/2013   HTN (hypertension) 05/20/2013   GENERALIZED OSTEOARTHROSIS UNSPECIFIED SITE 06/10/2008   EPIGASTRIC PAIN 06/10/2008   GASTRIC ULCER, HX OF 06/10/2008   INTERNAL HEMORRHOIDS 06/08/2008    PCP: Nadene Rubins  REFERRING PROVIDER: Coletta Memos  REFERRING DIAG:  M54.42 (ICD-10-CM) - Lumbago with sciatica, left side    Rationale for Evaluation and Treatment: Rehabilitation  THERAPY DIAG:   Abnormal posture  Left-sided low back pain with left-sided sciatica, unspecified chronicity  Muscle weakness (generalized)  Other low back pain  ONSET DATE: 11/27/22  SUBJECTIVE:                                                                                                                                                                                           SUBJECTIVE STATEMENT: Doing fine, still having pain in my tailbone and down in to my leg. I started going to the [facility].  PERTINENT HISTORY:  No surgical hx, arthritis   PAIN:  Are you having pain? Yes: NPRS scale: 5/10 Pain location: L hip down my leg, low back Pain description: achy, sharp, radiating down leg, constant and never goes away, numbness Aggravating factors: standing, walking Relieving factors: just the medicine but only for a short time  PRECAUTIONS: None  WEIGHT BEARING RESTRICTIONS: No  FALLS:  Has patient fallen in last 6 months? No  LIVING ENVIRONMENT: Lives with: lives with their spouse Lives in: House/apartment Stairs: Yes: External: 3-4 steps; on left going up Has following equipment at home: None  OCCUPATION: Retired  PLOF: Independent and Independent with basic ADLs  PATIENT GOALS: some how get rid of the pain in my back and leg   NEXT MD VISIT: 12/11/22- spine doctor for injection  OBJECTIVE:   DIAGNOSTIC FINDINGS:  IMPRESSION: 1. Left lateral recess narrowing at L4-L5 and L5-S1, which may be a source of left L5 and/or S1 radiculopathy. 2. No central spinal canal or neural foraminal stenosis. 3. Chronic compression deformity of T11 with mild narrowing of the ventral thecal sac.   SCREENING FOR RED FLAGS: Bowel or bladder incontinence: No Spinal tumors: No Cauda equina syndrome: No Compression fracture: No Abdominal aneurysm: No  COGNITION: Overall cognitive status: Within functional limits for tasks assessed     SENSATION: WFL  MUSCLE LENGTH: Hamstrings: extremely  tight in BLE  POSTURE: rounded shoulders, forward head, increased thoracic kyphosis, posterior pelvic tilt, and flexed trunk   PALPATION: TTP SIJ, L4-L5  LUMBAR ROM:   AROM eval 12/21/22 01/09/23 02/01/23 02/13/23  Flexion 75% pain in low back [facility] WFL  Extension 25% with pain Limited 50% with pain Limited 25% Limited 25% Only 25% movement  Right lateral flexion Fib head  [facility] WFL  Left lateral flexion Fib head, some pain WFL with pain [facility] WFL  Right rotation 25% with pain Limited 75% Limited 50% Limited 50% Limited 50%  Left rotation 25% with pain Limited 75% Limited 50% Limited 25% Limited 50%   (Blank rows = not tested)  LOWER EXTREMITY ROM:   grossly WFL    LOWER EXTREMITY MMT:  grossly 4-4+/5    LUMBAR SPECIAL TESTS:  Straight leg raise test: Positive and Slump test: Negative  FUNCTIONAL TESTS:  5 times sit to stand: 14.34s, 02/01/23 =11.33 sec Timed up and go (TUG): 19.01s, 02/01/23 = 14.28 sec  GAIT: Distance walked: in clinic distances Assistive device utilized: None Level of assistance: Modified independence Comments: flexed trunk, decreased foot clearance, forward head, kyphotic posture, increased posterior pelvic tilt, slowed speed   TODAY'S TREATMENT:                                                                                                                              DATE:  02/13/23 Progress note NuStep L5 x40mins  UBE L2 x90mins each way  Pec stretch in doorway 30s x2 Scapular retraction with red  band against wall 2x10 Horizontal abd with red band against wall 2x10  Shoulder ext 10#  2x10 Seated trunk rotation reach outs x5 each side  Prone on elbows x5, then hip ext 2x5 each side    02/08/23 NuStep L5 x8 mins  STS with shoulder flexion 2x10 2# WaTE  HS curls 35lb 2x10 Leg Ext 10lb 2x10 Seated Rows and Lats 25lb 2x10  blackTB ext 2x10 Horiz Abd green 2x10  02/06/23 NuStep L5 x 8 min S2S OHP blue ball 2x10 Slant board calf  stretch 4x10'' Shoulder Ext 10lb 2x10 for core  HS curls 35lb 2x10 Leg Ext 10lb 2x10 Horiz Abd green 2x10   02/01/23 NuStep L 5 x7 min HS curls 35lb 2x10 Leg Ext 10lb 2x10 S2S OHP yellow ball 2x10 Standing shoulder Ext 2lb 2x10 Seated Trunk rotations yellow ball x10    01/30/23 NuStep L5 x 7 min Resisted side steps 20lb x 5 each S2S OHP yellow ball 2x10 Seated Trunk rotations yellow ball x10  Seated Rows and Lats 25lb 2x10  Shoulder Ext blue 2x10  01/24/23 TherEx  UBE L1 backwards only for postural muscle activation x6 minutes Rows 25# 2x10  Trunk extensions against wall x3- limited by pain LLE  Thoracic extensions seated x10 Thoracic lateral flexion x10 B Thoracic rotation x10 B HS stretches 3x30 seconds B Piriformis stretch 2x30 seconds B  Lumbar rotation stretch 5x5 seconds B Attempted bridges- unable to tolerate due to pain QL stretch 3x15 seconds     01/23/23 NuStep L 5 x 7 min S2S OHP yellow ball 2x10 Standing rows 10lb 2x10 W backs Trunk rotations Shoulder Ext 5lb 2x10  MHP on lumbar spine  Passive stretching to HS, piriformis, Glute, ITB    01/18/23 NuStep L 6 x 7 min S2S OHP form elevated surface 2x10 Facing wall overhead ball Ext  Back against wall overhead Ext HS curls 25lb 2x10 Leg Ext 10lb 2x10 Shoulder Ext 5lb 2x10  MHP on lumbar spine  Bridges   SLR  Piriformis stretch   01/15/23 NuStep L 6 x 7 min Gait 40 feet Shoulder flex for posture 2lb WaTE 2x10 Standing kickbacks 5lbs x10 B Standing arrowheads with green t-band 2x15 Standing horizontal abd with red t-band 2x15 Standing rows with blue t-band 2x15 MHP to hips/low back in supine; legs bent and 3 pillows under head  01/11/23 NuStep L 6 x 7 min UBE L2 x 2 min each Shoulder flex for posture 2lb WaTE 2x10 Standing OHP yellow ball 2x10 Standing rows & Ext green 2x12 4in forward and lateral step ups  Gait 1 lap ~100 feet    01/09/23 NuStep L 5 x 6 min CHECKED GOALS Rows &  Lats 35lb 2x10 Lumbar Ext Black band 3x10 Hip Ext 5lb x 10 each MHP to lumbar spine  Bridges  SLR x10 each   Ball squeeze and bridge   01/04/23 NuStep L 5 x 6 min UBE L1 x2 min each Leg press 20lb 2x10 Rows & Lats 25lb 2x12 Lumbar Ext Black band 2x10 Horiz shoulder abd back against wall with half foam roll red 2x10 MHP to lumbar spine and R shoulder x10 min    PATIENT EDUCATION:  Education details: POC and HEP Person educated: Patient and Child(ren) Education method: Explanation Education comprehension: verbalized understanding  HOME EXERCISE PROGRAM: Access Code: [email or web address] URL: [email or web address] Date: 12/04/2022 Prepared by: Cassie Freer  Exercises - Supine Lower Trunk Rotation  - 1 x daily - 7 x weekly - 2 sets - 10  reps - Supine Bridge  - 1 x daily - 7 x weekly - 2 sets - 10 reps - Supine Single Knee to Chest Stretch  - 2 x daily - 7 x weekly - 30 hold - Supine Active Straight Leg Raise  - 1 x daily - 7 x weekly - 2 sets - 10 reps - Seated Hamstring Stretch  - 2 x daily - 7 x weekly - 30 hold  ASSESSMENT:  CLINICAL IMPRESSION: Progress note complete, he has made some small improvements but not significant. Reports he still has pain and has joined the [facility] to go on the days he is not here.  We continued to spend time working further on postural mechanics and strengthening. Was able to get patient prone with pillows underneath hips to work on extension.    OBJECTIVE IMPAIRMENTS: Abnormal gait, difficulty walking, decreased strength, improper body mechanics, postural dysfunction, and pain.   ACTIVITY LIMITATIONS: bending, sitting, standing, stairs, and locomotion level   REHAB POTENTIAL: Good  CLINICAL DECISION MAKING: Stable/uncomplicated  EVALUATION COMPLEXITY: Low  GOALS: Goals reviewed with patient? Yes  SHORT TERM GOALS: Target date: 01/15/23  Patient will be independent with initial HEP.  Goal status: Met 12/21/22  2.  Patient will  report centralization of radicular symptoms.  Baseline: radiating pain in to leg Goal status: ongoing 02/13/23  3.  Patient will demonstrate TUG < 15s  Baseline: 19.01  Goal status: 14.82 Met  01/09/23    LONG TERM GOALS: Target date: 02/26/23  Patient will be independent with advanced/ongoing HEP to improve outcomes and carryover.  Goal status: INITIAL  2.  Patient will report 75% improvement in low back pain to improve QOL.  Baseline: 7/10 Goal status: Progressing 02/01/23, 30% better 02/13/23  3.  Patient will demonstrate full pain free lumbar ROM to perform ADLs.   Goal status: On going 01/09/23, ongoing 02/13/23  4.  Patient to demonstrate ability to achieve and maintain good spinal alignment/posturing and body mechanics needed for daily activities. Baseline: forward head, kyphosis, posterior pelvis tilt Goal status: on going 02/01/23, progressing 02/13/23  PLAN:  PT FREQUENCY: 2x/week  PT DURATION: 12 weeks  PLANNED INTERVENTIONS: Therapeutic exercises, Therapeutic activity, Neuromuscular re-education, Balance training, Gait training, Patient/Family education, Self Care, Joint mobilization, Joint manipulation, Stair training, Dry Needling, Electrical stimulation, Spinal manipulation, Spinal mobilization, Cryotherapy, Moist heat, and Manual therapy.  PLAN FOR NEXT SESSION: NuStep, postural exercises for upper back and pelvis. Needs full re-assessment soon, possible DC to home program?   Cassie Freer, PT, DPT 02/13/23 3:43 PM

## 2023-02-13 ENCOUNTER — Ambulatory Visit: Payer: Medicare Other

## 2023-02-13 DIAGNOSIS — M6281 Muscle weakness (generalized): Secondary | ICD-10-CM

## 2023-02-13 DIAGNOSIS — M5459 Other low back pain: Secondary | ICD-10-CM | POA: Diagnosis not present

## 2023-02-13 DIAGNOSIS — R293 Abnormal posture: Secondary | ICD-10-CM

## 2023-02-13 DIAGNOSIS — M5442 Lumbago with sciatica, left side: Secondary | ICD-10-CM | POA: Diagnosis not present

## 2023-02-14 NOTE — Therapy (Signed)
OUTPATIENT PHYSICAL THERAPY THORACOLUMBAR TREATMENT   Progress Note Reporting Period 01/09/23 to 02/13/23  See note below for Objective Data and Assessment of Progress/Goals.      Patient Name: Cameron Todd MRN: [id number] DOB:November 12, 1932, 89 y.o., male Today's Date: 02/14/2023  END OF SESSION:       Past Medical History:  Diagnosis Date   Arthritis    Barrett's esophagus    Compression fracture of spine (HCC)    T 10   Diabetes (HCC)    Elevated cholesterol    Gastric ulcer 1990   Hemorrhoids    History of colon polyps    Hypertension    Tubular adenoma of colon    Past Surgical History:  Procedure Laterality Date   cataract surgery bilateral     NO PAST SURGERIES     UPPER GASTROINTESTINAL ENDOSCOPY     Patient Active Problem List   Diagnosis Date Noted   Age related osteoporosis 11/09/2020   Cold intolerance 11/09/2020   Polyneuropathy 11/09/2020   Vitamin D deficiency 11/09/2020   Dorsalgia 11/09/2020   Controlled type 2 diabetes mellitus without complication, without long-term current use of insulin (HCC) 11/09/2020   Hypertensive heart and renal disease 11/09/2020   Benign prostatic hyperplasia with urinary hesitancy 11/09/2020   Barrett's esophagus 05/20/2013   HTN (hypertension) 05/20/2013   GENERALIZED OSTEOARTHROSIS UNSPECIFIED SITE 06/10/2008   EPIGASTRIC PAIN 06/10/2008   GASTRIC ULCER, HX OF 06/10/2008   INTERNAL HEMORRHOIDS 06/08/2008    PCP: Nadene Rubins  REFERRING PROVIDER: Coletta Memos  REFERRING DIAG:  M54.42 (ICD-10-CM) - Lumbago with sciatica, left side    Rationale for Evaluation and Treatment: Rehabilitation  THERAPY DIAG:  No diagnosis found.  ONSET DATE: 11/27/22  SUBJECTIVE:                                                                                                                                                                                           SUBJECTIVE STATEMENT: Doing fine, still having pain in my  tailbone and down in to my leg. I started going to the [facility].    PERTINENT HISTORY:  No surgical hx, arthritis   PAIN:  Are you having pain? Yes: NPRS scale: 5/10 Pain location: L hip down my leg, low back Pain description: achy, sharp, radiating down leg, constant and never goes away, numbness Aggravating factors: standing, walking Relieving factors: just the medicine but only for a short time  PRECAUTIONS: None  WEIGHT BEARING RESTRICTIONS: No  FALLS:  Has patient fallen in last 6 months? No  LIVING ENVIRONMENT: Lives with: lives with their spouse Lives in: House/apartment Stairs: Yes: External: 3-4  steps; on left going up Has following equipment at home: None  OCCUPATION: Retired  PLOF: Independent and Independent with basic ADLs  PATIENT GOALS: some how get rid of the pain in my back and leg   NEXT MD VISIT: 12/11/22- spine doctor for injection  OBJECTIVE:   DIAGNOSTIC FINDINGS:  IMPRESSION: 1. Left lateral recess narrowing at L4-L5 and L5-S1, which may be a source of left L5 and/or S1 radiculopathy. 2. No central spinal canal or neural foraminal stenosis. 3. Chronic compression deformity of T11 with mild narrowing of the ventral thecal sac.   SCREENING FOR RED FLAGS: Bowel or bladder incontinence: No Spinal tumors: No Cauda equina syndrome: No Compression fracture: No Abdominal aneurysm: No  COGNITION: Overall cognitive status: Within functional limits for tasks assessed     SENSATION: WFL  MUSCLE LENGTH: Hamstrings: extremely tight in BLE  POSTURE: rounded shoulders, forward head, increased thoracic kyphosis, posterior pelvic tilt, and flexed trunk   PALPATION: TTP SIJ, L4-L5  LUMBAR ROM:   AROM eval 12/21/22 01/09/23 02/01/23 02/13/23  Flexion 75% pain in low back [facility] WFL  Extension 25% with pain Limited 50% with pain Limited 25% Limited 25% Only 25% movement  Right lateral flexion Fib head  [facility] WFL  Left lateral flexion Fib head,  some pain WFL with pain [facility] WFL  Right rotation 25% with pain Limited 75% Limited 50% Limited 50% Limited 50%  Left rotation 25% with pain Limited 75% Limited 50% Limited 25% Limited 50%   (Blank rows = not tested)  LOWER EXTREMITY ROM:   grossly WFL    LOWER EXTREMITY MMT:  grossly 4-4+/5    LUMBAR SPECIAL TESTS:  Straight leg raise test: Positive and Slump test: Negative  FUNCTIONAL TESTS:  5 times sit to stand: 14.34s, 02/01/23 =11.33 sec Timed up and go (TUG): 19.01s, 02/01/23 = 14.28 sec  GAIT: Distance walked: in clinic distances Assistive device utilized: None Level of assistance: Modified independence Comments: flexed trunk, decreased foot clearance, forward head, kyphotic posture, increased posterior pelvic tilt, slowed speed   TODAY'S TREATMENT:                                                                                                                              DATE:  02/15/23 NuStep blackTB ext Seated rows Lat pull down Leg press HS curls 35lb 2x10 Leg Ext 10lb 2x10    02/13/23 Progress note NuStep L5 x73mins  UBE L2 x55mins each way  Pec stretch in doorway 30s x2 Scapular retraction with red band against wall 2x10 Horizontal abd with red band against wall 2x10  Shoulder ext 10#  2x10 Seated trunk rotation reach outs x5 each side  Prone on elbows x5, then hip ext 2x5 each side    02/08/23 NuStep L5 x8 mins  STS with shoulder flexion 2x10 2# WaTE  HS curls 35lb 2x10 Leg Ext 10lb 2x10 Seated Rows and Lats 25lb 2x10  blackTB ext 2x10 Horiz Abd green 2x10  02/06/23 NuStep L5 x 8 min S2S OHP blue ball 2x10 Slant board calf stretch 4x10'' Shoulder Ext 10lb 2x10 for core  HS curls 35lb 2x10 Leg Ext 10lb 2x10 Horiz Abd green 2x10   02/01/23 NuStep L 5 x7 min HS curls 35lb 2x10 Leg Ext 10lb 2x10 S2S OHP yellow ball 2x10 Standing shoulder Ext 2lb 2x10 Seated Trunk rotations yellow ball x10    01/30/23 NuStep L5 x 7 min Resisted side  steps 20lb x 5 each S2S OHP yellow ball 2x10 Seated Trunk rotations yellow ball x10  Seated Rows and Lats 25lb 2x10  Shoulder Ext blue 2x10  01/24/23 TherEx  UBE L1 backwards only for postural muscle activation x6 minutes Rows 25# 2x10  Trunk extensions against wall x3- limited by pain LLE  Thoracic extensions seated x10 Thoracic lateral flexion x10 B Thoracic rotation x10 B HS stretches 3x30 seconds B Piriformis stretch 2x30 seconds B  Lumbar rotation stretch 5x5 seconds B Attempted bridges- unable to tolerate due to pain QL stretch 3x15 seconds     01/23/23 NuStep L 5 x 7 min S2S OHP yellow ball 2x10 Standing rows 10lb 2x10 W backs Trunk rotations Shoulder Ext 5lb 2x10  MHP on lumbar spine  Passive stretching to HS, piriformis, Glute, ITB    01/18/23 NuStep L 6 x 7 min S2S OHP form elevated surface 2x10 Facing wall overhead ball Ext  Back against wall overhead Ext HS curls 25lb 2x10 Leg Ext 10lb 2x10 Shoulder Ext 5lb 2x10  MHP on lumbar spine  Bridges   SLR  Piriformis stretch   01/15/23 NuStep L 6 x 7 min Gait 40 feet Shoulder flex for posture 2lb WaTE 2x10 Standing kickbacks 5lbs x10 B Standing arrowheads with green t-band 2x15 Standing horizontal abd with red t-band 2x15 Standing rows with blue t-band 2x15 MHP to hips/low back in supine; legs bent and 3 pillows under head  01/11/23 NuStep L 6 x 7 min UBE L2 x 2 min each Shoulder flex for posture 2lb WaTE 2x10 Standing OHP yellow ball 2x10 Standing rows & Ext green 2x12 4in forward and lateral step ups  Gait 1 lap ~100 feet    01/09/23 NuStep L 5 x 6 min CHECKED GOALS Rows & Lats 35lb 2x10 Lumbar Ext Black band 3x10 Hip Ext 5lb x 10 each MHP to lumbar spine  Bridges  SLR x10 each   Ball squeeze and bridge   01/04/23 NuStep L 5 x 6 min UBE L1 x2 min each Leg press 20lb 2x10 Rows & Lats 25lb 2x12 Lumbar Ext Black band 2x10 Horiz shoulder abd back against wall with half foam roll red  2x10 MHP to lumbar spine and R shoulder x10 min    PATIENT EDUCATION:  Education details: POC and HEP Person educated: Patient and Child(ren) Education method: Explanation Education comprehension: verbalized understanding  HOME EXERCISE PROGRAM: Access Code: [email or web address] URL: [email or web address] Date: 12/04/2022 Prepared by: Cassie Freer  Exercises - Supine Lower Trunk Rotation  - 1 x daily - 7 x weekly - 2 sets - 10 reps - Supine Bridge  - 1 x daily - 7 x weekly - 2 sets - 10 reps - Supine Single Knee to Chest Stretch  - 2 x daily - 7 x weekly - 30 hold - Supine Active Straight Leg Raise  - 1 x daily - 7 x weekly - 2 sets - 10 reps - Seated Hamstring Stretch  - 2 x daily -  7 x weekly - 30 hold  ASSESSMENT:  CLINICAL IMPRESSION: Progress note complete, he has made some small improvements but not significant. Reports he still has pain and has joined the [facility] to go on the days he is not here.  We continued to spend time working further on postural mechanics and strengthening. Was able to get patient prone with pillows underneath hips to work on extension.    OBJECTIVE IMPAIRMENTS: Abnormal gait, difficulty walking, decreased strength, improper body mechanics, postural dysfunction, and pain.   ACTIVITY LIMITATIONS: bending, sitting, standing, stairs, and locomotion level   REHAB POTENTIAL: Good  CLINICAL DECISION MAKING: Stable/uncomplicated  EVALUATION COMPLEXITY: Low  GOALS: Goals reviewed with patient? Yes  SHORT TERM GOALS: Target date: 01/15/23  Patient will be independent with initial HEP.  Goal status: Met 12/21/22  2.  Patient will report centralization of radicular symptoms.  Baseline: radiating pain in to leg Goal status: ongoing 02/13/23  3.  Patient will demonstrate TUG < 15s  Baseline: 19.01  Goal status: 14.82 Met  01/09/23    LONG TERM GOALS: Target date: 02/26/23  Patient will be independent with advanced/ongoing HEP to improve outcomes  and carryover.  Goal status: INITIAL  2.  Patient will report 75% improvement in low back pain to improve QOL.  Baseline: 7/10 Goal status: Progressing 02/01/23, 30% better 02/13/23  3.  Patient will demonstrate full pain free lumbar ROM to perform ADLs.   Goal status: On going 01/09/23, ongoing 02/13/23  4.  Patient to demonstrate ability to achieve and maintain good spinal alignment/posturing and body mechanics needed for daily activities. Baseline: forward head, kyphosis, posterior pelvis tilt Goal status: on going 02/01/23, progressing 02/13/23  PLAN:  PT FREQUENCY: 2x/week  PT DURATION: 12 weeks  PLANNED INTERVENTIONS: Therapeutic exercises, Therapeutic activity, Neuromuscular re-education, Balance training, Gait training, Patient/Family education, Self Care, Joint mobilization, Joint manipulation, Stair training, Dry Needling, Electrical stimulation, Spinal manipulation, Spinal mobilization, Cryotherapy, Moist heat, and Manual therapy.  PLAN FOR NEXT SESSION: NuStep, postural exercises for upper back and pelvis. Needs full re-assessment soon, possible DC to home program?   Cassie Freer, PT, DPT 02/14/23 3:32 PM

## 2023-02-15 ENCOUNTER — Ambulatory Visit: Payer: Medicare Other

## 2023-02-15 DIAGNOSIS — M5442 Lumbago with sciatica, left side: Secondary | ICD-10-CM

## 2023-02-15 DIAGNOSIS — M6281 Muscle weakness (generalized): Secondary | ICD-10-CM | POA: Diagnosis not present

## 2023-02-15 DIAGNOSIS — R293 Abnormal posture: Secondary | ICD-10-CM

## 2023-02-15 DIAGNOSIS — M5459 Other low back pain: Secondary | ICD-10-CM | POA: Diagnosis not present

## 2023-02-20 ENCOUNTER — Ambulatory Visit: Payer: Medicare Other | Admitting: Physical Therapy

## 2023-02-20 ENCOUNTER — Encounter: Payer: Self-pay | Admitting: Physical Therapy

## 2023-02-20 DIAGNOSIS — M5459 Other low back pain: Secondary | ICD-10-CM

## 2023-02-20 DIAGNOSIS — M6281 Muscle weakness (generalized): Secondary | ICD-10-CM

## 2023-02-20 DIAGNOSIS — R293 Abnormal posture: Secondary | ICD-10-CM

## 2023-02-20 DIAGNOSIS — M5442 Lumbago with sciatica, left side: Secondary | ICD-10-CM

## 2023-02-20 NOTE — Therapy (Signed)
OUTPATIENT PHYSICAL THERAPY THORACOLUMBAR TREATMENT    Patient Name: Cameron Todd MRN: [id number] DOB:March 01, 1933, 89 y.o., male Today's Date: 02/20/2023  END OF SESSION:  PT End of Session - 02/20/23 1515     Visit Number 22    Date for PT Re-Evaluation 02/26/23    PT Start Time 1515    PT Stop Time 1600    PT Time Calculation (min) 45 min    Activity Tolerance Patient tolerated treatment well    Behavior During Therapy [facility] for tasks assessed/performed                 Past Medical History:  Diagnosis Date   Arthritis    Barrett's esophagus    Compression fracture of spine (HCC)    T 10   Diabetes (HCC)    Elevated cholesterol    Gastric ulcer 1990   Hemorrhoids    History of colon polyps    Hypertension    Tubular adenoma of colon    Past Surgical History:  Procedure Laterality Date   cataract surgery bilateral     NO PAST SURGERIES     UPPER GASTROINTESTINAL ENDOSCOPY     Patient Active Problem List   Diagnosis Date Noted   Age related osteoporosis 11/09/2020   Cold intolerance 11/09/2020   Polyneuropathy 11/09/2020   Vitamin D deficiency 11/09/2020   Dorsalgia 11/09/2020   Controlled type 2 diabetes mellitus without complication, without long-term current use of insulin (HCC) 11/09/2020   Hypertensive heart and renal disease 11/09/2020   Benign prostatic hyperplasia with urinary hesitancy 11/09/2020   Barrett's esophagus 05/20/2013   HTN (hypertension) 05/20/2013   GENERALIZED OSTEOARTHROSIS UNSPECIFIED SITE 06/10/2008   EPIGASTRIC PAIN 06/10/2008   GASTRIC ULCER, HX OF 06/10/2008   INTERNAL HEMORRHOIDS 06/08/2008    PCP: Nadene Rubins  REFERRING PROVIDER: Coletta Memos  REFERRING DIAG:  M54.42 (ICD-10-CM) - Lumbago with sciatica, left side    Rationale for Evaluation and Treatment: Rehabilitation  THERAPY DIAG:  Abnormal posture  Left-sided low back pain with left-sided sciatica, unspecified chronicity  Muscle weakness  (generalized)  Other low back pain  ONSET DATE: 11/27/22  SUBJECTIVE:                                                                                                                                                                                           SUBJECTIVE STATEMENT: "Better" less pain int eh hip that goes down the leg, but pain is still there  PERTINENT HISTORY:  No surgical hx, arthritis   PAIN:  Are you having pain? Yes: NPRS scale: 3/10 Pain location: L hip down my leg,  low back Pain description: achy, sharp, radiating down leg, constant and never goes away, numbness Aggravating factors: standing, walking Relieving factors: just the medicine but only for a short time  PRECAUTIONS: None  WEIGHT BEARING RESTRICTIONS: No  FALLS:  Has patient fallen in last 6 months? No  LIVING ENVIRONMENT: Lives with: lives with their spouse Lives in: House/apartment Stairs: Yes: External: 3-4 steps; on left going up Has following equipment at home: None  OCCUPATION: Retired  PLOF: Independent and Independent with basic ADLs  PATIENT GOALS: some how get rid of the pain in my back and leg   NEXT MD VISIT: 12/11/22- spine doctor for injection  OBJECTIVE:   DIAGNOSTIC FINDINGS:  IMPRESSION: 1. Left lateral recess narrowing at L4-L5 and L5-S1, which may be a source of left L5 and/or S1 radiculopathy. 2. No central spinal canal or neural foraminal stenosis. 3. Chronic compression deformity of T11 with mild narrowing of the ventral thecal sac.   SCREENING FOR RED FLAGS: Bowel or bladder incontinence: No Spinal tumors: No Cauda equina syndrome: No Compression fracture: No Abdominal aneurysm: No  COGNITION: Overall cognitive status: Within functional limits for tasks assessed     SENSATION: WFL  MUSCLE LENGTH: Hamstrings: extremely tight in BLE  POSTURE: rounded shoulders, forward head, increased thoracic kyphosis, posterior pelvic tilt, and flexed trunk    PALPATION: TTP SIJ, L4-L5  LUMBAR ROM:   AROM eval 12/21/22 01/09/23 02/01/23 02/13/23  Flexion 75% pain in low back [facility] WFL  Extension 25% with pain Limited 50% with pain Limited 25% Limited 25% Only 25% movement  Right lateral flexion Fib head  [facility] WFL  Left lateral flexion Fib head, some pain WFL with pain [facility] WFL  Right rotation 25% with pain Limited 75% Limited 50% Limited 50% Limited 50%  Left rotation 25% with pain Limited 75% Limited 50% Limited 25% Limited 50%   (Blank rows = not tested)  LOWER EXTREMITY ROM:   grossly WFL    LOWER EXTREMITY MMT:  grossly 4-4+/5    LUMBAR SPECIAL TESTS:  Straight leg raise test: Positive and Slump test: Negative  FUNCTIONAL TESTS:  5 times sit to stand: 14.34s, 02/01/23 =11.33 sec Timed up and go (TUG): 19.01s, 02/01/23 = 14.28 sec  GAIT: Distance walked: in clinic distances Assistive device utilized: None Level of assistance: Modified independence Comments: flexed trunk, decreased foot clearance, forward head, kyphotic posture, increased posterior pelvic tilt, slowed speed   TODAY'S TREATMENT:                                                                                                                              DATE:  02/20/23 NuStep L5 x6 min Seated rows 35# 2x12 Lat pull down 25# 2x12 blackTB ext 2x10 Pec stretch in doorway 10s x3 S2S holding blue ball 2x10 Horiz Abd blue 2x10 Calf stretch   02/15/23 NuStep L5 x11mins  blackTB ext 2x10 Seated rows 25# 2x10 Lat  pull down 25# 2x10 Leg press 20# x10, 30# x10   02/13/23 Progress note NuStep L5 x72mins  UBE L2 x83mins each way  Pec stretch in doorway 30s x2 Scapular retraction with red band against wall 2x10 Horizontal abd with red band against wall 2x10  Shoulder ext 10#  2x10 Seated trunk rotation reach outs x5 each side  Prone on elbows x5, then hip ext 2x5 each side    02/08/23 NuStep L5 x8 mins  STS with shoulder flexion 2x10 2# WaTE  HS  curls 35lb 2x10 Leg Ext 10lb 2x10 Seated Rows and Lats 25lb 2x10  blackTB ext 2x10 Horiz Abd green 2x10  02/06/23 NuStep L5 x 8 min S2S OHP blue ball 2x10 Slant board calf stretch 4x10'' Shoulder Ext 10lb 2x10 for core  HS curls 35lb 2x10 Leg Ext 10lb 2x10 Horiz Abd green 2x10   02/01/23 NuStep L 5 x7 min HS curls 35lb 2x10 Leg Ext 10lb 2x10 S2S OHP yellow ball 2x10 Standing shoulder Ext 2lb 2x10 Seated Trunk rotations yellow ball x10    01/30/23 NuStep L5 x 7 min Resisted side steps 20lb x 5 each S2S OHP yellow ball 2x10 Seated Trunk rotations yellow ball x10  Seated Rows and Lats 25lb 2x10  Shoulder Ext blue 2x10  01/24/23 TherEx  UBE L1 backwards only for postural muscle activation x6 minutes Rows 25# 2x10  Trunk extensions against wall x3- limited by pain LLE  Thoracic extensions seated x10 Thoracic lateral flexion x10 B Thoracic rotation x10 B HS stretches 3x30 seconds B Piriformis stretch 2x30 seconds B  Lumbar rotation stretch 5x5 seconds B Attempted bridges- unable to tolerate due to pain QL stretch 3x15 seconds     01/23/23 NuStep L 5 x 7 min S2S OHP yellow ball 2x10 Standing rows 10lb 2x10 W backs Trunk rotations Shoulder Ext 5lb 2x10  MHP on lumbar spine  Passive stretching to HS, piriformis, Glute, ITB    01/18/23 NuStep L 6 x 7 min S2S OHP form elevated surface 2x10 Facing wall overhead ball Ext  Back against wall overhead Ext HS curls 25lb 2x10 Leg Ext 10lb 2x10 Shoulder Ext 5lb 2x10  MHP on lumbar spine  Bridges   SLR  Piriformis stretch    PATIENT EDUCATION:  Education details: POC and HEP Person educated: Patient and Child(ren) Education method: Explanation Education comprehension: verbalized understanding  HOME EXERCISE PROGRAM: Access Code: [email or web address] URL: [email or web address] Date: 12/04/2022 Prepared by: Cassie Freer  Exercises - Supine Lower Trunk Rotation  - 1 x daily - 7 x weekly - 2 sets - 10 reps -  Supine Bridge  - 1 x daily - 7 x weekly - 2 sets - 10 reps - Supine Single Knee to Chest Stretch  - 2 x daily - 7 x weekly - 30 hold - Supine Active Straight Leg Raise  - 1 x daily - 7 x weekly - 2 sets - 10 reps - Seated Hamstring Stretch  - 2 x daily - 7 x weekly - 30 hold  ASSESSMENT:  CLINICAL IMPRESSION: Patient enters reporting improvement and less pain. Continued with interventions that focused on postural correction. Pt has severe postural limitations that's could possible contribute to his pain. Some increase in L hip pain that went down into leg with pec stretch. Cues for core engagement needed wth seated rows and shoulder Ext   OBJECTIVE IMPAIRMENTS: Abnormal gait, difficulty walking, decreased strength, improper body mechanics, postural dysfunction, and pain.   ACTIVITY LIMITATIONS: bending, sitting, standing, stairs, and  locomotion level   REHAB POTENTIAL: Good  CLINICAL DECISION MAKING: Stable/uncomplicated  EVALUATION COMPLEXITY: Low  GOALS: Goals reviewed with patient? Yes  SHORT TERM GOALS: Target date: 01/15/23  Patient will be independent with initial HEP.  Goal status: Met 12/21/22  2.  Patient will report centralization of radicular symptoms.  Baseline: radiating pain in to leg Goal status: ongoing 02/13/23  3.  Patient will demonstrate TUG < 15s  Baseline: 19.01  Goal status: 14.82 Met  01/09/23    LONG TERM GOALS: Target date: 02/26/23  Patient will be independent with advanced/ongoing HEP to improve outcomes and carryover.  Goal status: INITIAL  2.  Patient will report 75% improvement in low back pain to improve QOL.  Baseline: 7/10 Goal status: Progressing 02/01/23, 30% better 02/13/23  3.  Patient will demonstrate full pain free lumbar ROM to perform ADLs.   Goal status: On going 01/09/23, ongoing 02/13/23  4.  Patient to demonstrate ability to achieve and maintain good spinal alignment/posturing and body mechanics needed for daily  activities. Baseline: forward head, kyphosis, posterior pelvis tilt Goal status: on going 02/01/23, progressing 02/13/23  PLAN:  PT FREQUENCY: 2x/week  PT DURATION: 12 weeks  PLANNED INTERVENTIONS: Therapeutic exercises, Therapeutic activity, Neuromuscular re-education, Balance training, Gait training, Patient/Family education, Self Care, Joint mobilization, Joint manipulation, Stair training, Dry Needling, Electrical stimulation, Spinal manipulation, Spinal mobilization, Cryotherapy, Moist heat, and Manual therapy.  PLAN FOR NEXT SESSION: NuStep, postural exercises for upper back and pelvis. ?   Debroah Baller, PTA 02/20/23 3:16 PM

## 2023-02-22 ENCOUNTER — Ambulatory Visit: Payer: Medicare Other | Attending: Family Medicine | Admitting: Physical Therapy

## 2023-02-22 ENCOUNTER — Encounter: Payer: Self-pay | Admitting: Physical Therapy

## 2023-02-22 DIAGNOSIS — R293 Abnormal posture: Secondary | ICD-10-CM | POA: Diagnosis not present

## 2023-02-22 DIAGNOSIS — M5442 Lumbago with sciatica, left side: Secondary | ICD-10-CM | POA: Insufficient documentation

## 2023-02-22 DIAGNOSIS — M6281 Muscle weakness (generalized): Secondary | ICD-10-CM | POA: Diagnosis not present

## 2023-02-22 DIAGNOSIS — M5459 Other low back pain: Secondary | ICD-10-CM | POA: Diagnosis not present

## 2023-02-22 NOTE — Therapy (Signed)
OUTPATIENT PHYSICAL THERAPY THORACOLUMBAR TREATMENT    Patient Name: Cameron Todd MRN: [id number] DOB:06-10-1933, 89 y.o., male Today's Date: 02/22/2023  END OF SESSION:  PT End of Session - 02/22/23 1527     Visit Number 23    Date for PT Re-Evaluation 02/26/23    PT Start Time 1526    PT Stop Time 1600    PT Time Calculation (min) 34 min    Activity Tolerance Patient tolerated treatment well    Behavior During Therapy WFL for tasks assessed/performed                 Past Medical History:  Diagnosis Date   Arthritis    Barrett's esophagus    Compression fracture of spine (HCC)    T 10   Diabetes (HCC)    Elevated cholesterol    Gastric ulcer 1990   Hemorrhoids    History of colon polyps    Hypertension    Tubular adenoma of colon    Past Surgical History:  Procedure Laterality Date   cataract surgery bilateral     NO PAST SURGERIES     UPPER GASTROINTESTINAL ENDOSCOPY     Patient Active Problem List   Diagnosis Date Noted   Age related osteoporosis 11/09/2020   Cold intolerance 11/09/2020   Polyneuropathy 11/09/2020   Vitamin D deficiency 11/09/2020   Dorsalgia 11/09/2020   Controlled type 2 diabetes mellitus without complication, without long-term current use of insulin (HCC) 11/09/2020   Hypertensive heart and renal disease 11/09/2020   Benign prostatic hyperplasia with urinary hesitancy 11/09/2020   Barrett's esophagus 05/20/2013   HTN (hypertension) 05/20/2013   GENERALIZED OSTEOARTHROSIS UNSPECIFIED SITE 06/10/2008   EPIGASTRIC PAIN 06/10/2008   GASTRIC ULCER, HX OF 06/10/2008   INTERNAL HEMORRHOIDS 06/08/2008    PCP: Nadene Rubins  REFERRING PROVIDER: Coletta Memos  REFERRING DIAG:  M54.42 (ICD-10-CM) - Lumbago with sciatica, left side    Rationale for Evaluation and Treatment: Rehabilitation  THERAPY DIAG:  Abnormal posture  Left-sided low back pain with left-sided sciatica, unspecified chronicity  Other low back pain  Muscle  weakness (generalized)  ONSET DATE: 11/27/22  SUBJECTIVE:                                                                                                                                                                                           SUBJECTIVE STATEMENT: "I feel better"  PERTINENT HISTORY:  No surgical hx, arthritis   PAIN:  Are you having pain? Yes: NPRS scale: 2/10 Pain location: L hip down my leg, low back Pain description: achy, sharp, radiating down leg, constant and never goes  away, numbness Aggravating factors: standing, walking Relieving factors: just the medicine but only for a short time  PRECAUTIONS: None  WEIGHT BEARING RESTRICTIONS: No  FALLS:  Has patient fallen in last 6 months? No  LIVING ENVIRONMENT: Lives with: lives with their spouse Lives in: House/apartment Stairs: Yes: External: 3-4 steps; on left going up Has following equipment at home: None  OCCUPATION: Retired  PLOF: Independent and Independent with basic ADLs  PATIENT GOALS: some how get rid of the pain in my back and leg   NEXT MD VISIT: 12/11/22- spine doctor for injection  OBJECTIVE:   DIAGNOSTIC FINDINGS:  IMPRESSION: 1. Left lateral recess narrowing at L4-L5 and L5-S1, which may be a source of left L5 and/or S1 radiculopathy. 2. No central spinal canal or neural foraminal stenosis. 3. Chronic compression deformity of T11 with mild narrowing of the ventral thecal sac.   SCREENING FOR RED FLAGS: Bowel or bladder incontinence: No Spinal tumors: No Cauda equina syndrome: No Compression fracture: No Abdominal aneurysm: No  COGNITION: Overall cognitive status: Within functional limits for tasks assessed     SENSATION: WFL  MUSCLE LENGTH: Hamstrings: extremely tight in BLE  POSTURE: rounded shoulders, forward head, increased thoracic kyphosis, posterior pelvic tilt, and flexed trunk   PALPATION: TTP SIJ, L4-L5  LUMBAR ROM:   AROM eval 12/21/22 01/09/23 02/01/23  02/13/23 02/22/23  Flexion 75% pain in low back [facility] WFL  Extension 25% with pain Limited 50% with pain Limited 25% Limited 25% Only 25% movement Limited 25%  Right lateral flexion Fib head  [facility] WFL  Left lateral flexion Fib head, some pain WFL with pain [facility] WFL  Right rotation 25% with pain Limited 75% Limited 50% Limited 50% Limited 50% Limited 25%  Left rotation 25% with pain Limited 75% Limited 50% Limited 25% Limited 50% Limited 25%   (Blank rows = not tested)  LOWER EXTREMITY ROM:   grossly WFL    LOWER EXTREMITY MMT:  grossly 4-4+/5    LUMBAR SPECIAL TESTS:  Straight leg raise test: Positive and Slump test: Negative  FUNCTIONAL TESTS:  5 times sit to stand: 14.34s, 02/01/23 =11.33 sec Timed up and go (TUG): 19.01s, 02/01/23 = 14.28 sec  GAIT: Distance walked: in clinic distances Assistive device utilized: None Level of assistance: Modified independence Comments: flexed trunk, decreased foot clearance, forward head, kyphotic posture, increased posterior pelvic tilt, slowed speed   TODAY'S TREATMENT:                                                                                                                              DATE:  02/22/23 NuStep L 5 x 6 min Seated rows 35# 2x12 Lat pull down 25# 2x12 S2S OHP blue ball 2x10 Slant board calf stretch   02/20/23 NuStep L5 x6 min Seated rows 35# 2x12 Lat pull down 25# 2x12 blackTB ext 2x10 Pec stretch in doorway 10s x3 S2S holding  blue ball 2x10 Horiz Abd blue 2x10 Calf stretch   02/15/23 NuStep L5 x58mins  blackTB ext 2x10 Seated rows 25# 2x10 Lat pull down 25# 2x10 Leg press 20# x10, 30# x10   02/13/23 Progress note NuStep L5 x45mins  UBE L2 x42mins each way  Pec stretch in doorway 30s x2 Scapular retraction with red band against wall 2x10 Horizontal abd with red band against wall 2x10  Shoulder ext 10#  2x10 Seated trunk rotation reach outs x5 each side  Prone on elbows x5, then  hip ext 2x5 each side    02/08/23 NuStep L5 x8 mins  STS with shoulder flexion 2x10 2# WaTE  HS curls 35lb 2x10 Leg Ext 10lb 2x10 Seated Rows and Lats 25lb 2x10  blackTB ext 2x10 Horiz Abd green 2x10  02/06/23 NuStep L5 x 8 min S2S OHP blue ball 2x10 Slant board calf stretch 4x10'' Shoulder Ext 10lb 2x10 for core  HS curls 35lb 2x10 Leg Ext 10lb 2x10 Horiz Abd green 2x10   02/01/23 NuStep L 5 x7 min HS curls 35lb 2x10 Leg Ext 10lb 2x10 S2S OHP yellow ball 2x10 Standing shoulder Ext 2lb 2x10 Seated Trunk rotations yellow ball x10    01/30/23 NuStep L5 x 7 min Resisted side steps 20lb x 5 each S2S OHP yellow ball 2x10 Seated Trunk rotations yellow ball x10  Seated Rows and Lats 25lb 2x10  Shoulder Ext blue 2x10  01/24/23 TherEx  UBE L1 backwards only for postural muscle activation x6 minutes Rows 25# 2x10  Trunk extensions against wall x3- limited by pain LLE  Thoracic extensions seated x10 Thoracic lateral flexion x10 B Thoracic rotation x10 B HS stretches 3x30 seconds B Piriformis stretch 2x30 seconds B  Lumbar rotation stretch 5x5 seconds B Attempted bridges- unable to tolerate due to pain QL stretch 3x15 seconds     01/23/23 NuStep L 5 x 7 min S2S OHP yellow ball 2x10 Standing rows 10lb 2x10 W backs Trunk rotations Shoulder Ext 5lb 2x10  MHP on lumbar spine  Passive stretching to HS, piriformis, Glute, ITB    01/18/23 NuStep L 6 x 7 min S2S OHP form elevated surface 2x10 Facing wall overhead ball Ext  Back against wall overhead Ext HS curls 25lb 2x10 Leg Ext 10lb 2x10 Shoulder Ext 5lb 2x10  MHP on lumbar spine  Bridges   SLR  Piriformis stretch    PATIENT EDUCATION:  Education details: POC and HEP Person educated: Patient and Child(ren) Education method: Explanation Education comprehension: verbalized understanding  HOME EXERCISE PROGRAM: Access Code: [email or web address] URL: [email or web address] Date: 12/04/2022 Prepared by:  Cassie Freer  Exercises - Supine Lower Trunk Rotation  - 1 x daily - 7 x weekly - 2 sets - 10 reps - Supine Bridge  - 1 x daily - 7 x weekly - 2 sets - 10 reps - Supine Single Knee to Chest Stretch  - 2 x daily - 7 x weekly - 30 hold - Supine Active Straight Leg Raise  - 1 x daily - 7 x weekly - 2 sets - 10 reps - Seated Hamstring Stretch  - 2 x daily - 7 x weekly - 30 hold  ASSESSMENT:  CLINICAL IMPRESSION: Pt enters ~ 11 minutes late again reporting less pain. He has progressed towards long term goals. Pt also reports that he ha been going to the [facility] on his own. Continued with interventions that focused on postural correction. Pt has severe postural limitations that's could possible contribute to his pain. Cues for core  engagement needed wth seated rows and shoulder Ext. D/C next visit  OBJECTIVE IMPAIRMENTS: Abnormal gait, difficulty walking, decreased strength, improper body mechanics, postural dysfunction, and pain.   ACTIVITY LIMITATIONS: bending, sitting, standing, stairs, and locomotion level   REHAB POTENTIAL: Good  CLINICAL DECISION MAKING: Stable/uncomplicated  EVALUATION COMPLEXITY: Low  GOALS: Goals reviewed with patient? Yes  SHORT TERM GOALS: Target date: 01/15/23  Patient will be independent with initial HEP.  Goal status: Met 12/21/22  2.  Patient will report centralization of radicular symptoms.  Baseline: radiating pain in to leg Goal status: ongoing 02/13/23  3.  Patient will demonstrate TUG < 15s  Baseline: 19.01  Goal status: 14.82 Met  01/09/23    LONG TERM GOALS: Target date: 02/26/23  Patient will be independent with advanced/ongoing HEP to improve outcomes and carryover.  Goal status: INITIAL  2.  Patient will report 75% improvement in low back pain to improve QOL.  Baseline: 7/10 Goal status: Progressing 02/01/23, 30% better 02/13/23, 60-80% Partly met 02/22/23  3.  Patient will demonstrate full pain free lumbar ROM to perform ADLs.   Goal status:  On going 01/09/23, ongoing 02/13/23, Progressing 02/22/23  4.  Patient to demonstrate ability to achieve and maintain good spinal alignment/posturing and body mechanics needed for daily activities. Baseline: forward head, kyphosis, posterior pelvis tilt Goal status: on going 02/01/23, progressing 02/13/23  PLAN:  PT FREQUENCY: 2x/week  PT DURATION: 12 weeks  PLANNED INTERVENTIONS: Therapeutic exercises, Therapeutic activity, Neuromuscular re-education, Balance training, Gait training, Patient/Family education, Self Care, Joint mobilization, Joint manipulation, Stair training, Dry Needling, Electrical stimulation, Spinal manipulation, Spinal mobilization, Cryotherapy, Moist heat, and Manual therapy.  PLAN FOR NEXT SESSION: NuStep, postural exercises for upper back and pelvis. Possible D/C  Debroah Baller, PTA 02/22/23 3:28 PM

## 2023-02-23 ENCOUNTER — Ambulatory Visit: Payer: Medicare Other

## 2023-02-23 NOTE — Therapy (Signed)
OUTPATIENT PHYSICAL THERAPY THORACOLUMBAR TREATMENT    Patient Name: Cameron Todd MRN: [id number] DOB:05-15-1933, 89 y.o., male Today's Date: 02/26/2023  END OF SESSION:  PT End of Session - 02/26/23 1017     Visit Number 24    Date for PT Re-Evaluation 03/26/23    PT Start Time 1015    PT Stop Time 1100    PT Time Calculation (min) 45 min    Activity Tolerance Patient tolerated treatment well    Behavior During Therapy WFL for tasks assessed/performed                  Past Medical History:  Diagnosis Date   Arthritis    Barrett's esophagus    Compression fracture of spine (HCC)    T 10   Diabetes (HCC)    Elevated cholesterol    Gastric ulcer 1990   Hemorrhoids    History of colon polyps    Hypertension    Tubular adenoma of colon    Past Surgical History:  Procedure Laterality Date   cataract surgery bilateral     NO PAST SURGERIES     UPPER GASTROINTESTINAL ENDOSCOPY     Patient Active Problem List   Diagnosis Date Noted   Age related osteoporosis 11/09/2020   Cold intolerance 11/09/2020   Polyneuropathy 11/09/2020   Vitamin D deficiency 11/09/2020   Dorsalgia 11/09/2020   Controlled type 2 diabetes mellitus without complication, without long-term current use of insulin (HCC) 11/09/2020   Hypertensive heart and renal disease 11/09/2020   Benign prostatic hyperplasia with urinary hesitancy 11/09/2020   Barrett's esophagus 05/20/2013   HTN (hypertension) 05/20/2013   GENERALIZED OSTEOARTHROSIS UNSPECIFIED SITE 06/10/2008   EPIGASTRIC PAIN 06/10/2008   GASTRIC ULCER, HX OF 06/10/2008   INTERNAL HEMORRHOIDS 06/08/2008    PCP: Nadene Rubins  REFERRING PROVIDER: Coletta Memos  REFERRING DIAG:  M54.42 (ICD-10-CM) - Lumbago with sciatica, left side    Rationale for Evaluation and Treatment: Rehabilitation  THERAPY DIAG:  Abnormal posture  Left-sided low back pain with left-sided sciatica, unspecified chronicity  Other low back  pain  Muscle weakness (generalized)  ONSET DATE: 11/27/22  SUBJECTIVE:                                                                                                                                                                                           SUBJECTIVE STATEMENT: I am limping a little bit today   PERTINENT HISTORY:  No surgical hx, arthritis   PAIN:  Are you having pain? Yes: NPRS scale: 2/10 Pain location: L hip down my leg, low back Pain description: achy, sharp, radiating  down leg, constant and never goes away, numbness Aggravating factors: standing, walking Relieving factors: just the medicine but only for a short time  PRECAUTIONS: None  WEIGHT BEARING RESTRICTIONS: No  FALLS:  Has patient fallen in last 6 months? No  LIVING ENVIRONMENT: Lives with: lives with their spouse Lives in: House/apartment Stairs: Yes: External: 3-4 steps; on left going up Has following equipment at home: None  OCCUPATION: Retired  PLOF: Independent and Independent with basic ADLs  PATIENT GOALS: some how get rid of the pain in my back and leg   NEXT MD VISIT: 12/11/22- spine doctor for injection  OBJECTIVE:   DIAGNOSTIC FINDINGS:  IMPRESSION: 1. Left lateral recess narrowing at L4-L5 and L5-S1, which may be a source of left L5 and/or S1 radiculopathy. 2. No central spinal canal or neural foraminal stenosis. 3. Chronic compression deformity of T11 with mild narrowing of the ventral thecal sac.   SCREENING FOR RED FLAGS: Bowel or bladder incontinence: No Spinal tumors: No Cauda equina syndrome: No Compression fracture: No Abdominal aneurysm: No  COGNITION: Overall cognitive status: Within functional limits for tasks assessed     SENSATION: WFL  MUSCLE LENGTH: Hamstrings: extremely tight in BLE  POSTURE: rounded shoulders, forward head, increased thoracic kyphosis, posterior pelvic tilt, and flexed trunk   PALPATION: TTP SIJ, L4-L5  LUMBAR ROM:   AROM  eval 12/21/22 01/09/23 02/01/23 02/13/23 02/22/23  Flexion 75% pain in low back [facility] WFL  Extension 25% with pain Limited 50% with pain Limited 25% Limited 25% Only 25% movement Limited 25%  Right lateral flexion Fib head  [facility] WFL  Left lateral flexion Fib head, some pain WFL with pain [facility] WFL  Right rotation 25% with pain Limited 75% Limited 50% Limited 50% Limited 50% Limited 25%  Left rotation 25% with pain Limited 75% Limited 50% Limited 25% Limited 50% Limited 25%   (Blank rows = not tested)  LOWER EXTREMITY ROM:   grossly WFL    LOWER EXTREMITY MMT:  grossly 4-4+/5    LUMBAR SPECIAL TESTS:  Straight leg raise test: Positive and Slump test: Negative  FUNCTIONAL TESTS:  5 times sit to stand: 14.34s, 02/01/23 =11.33 sec Timed up and go (TUG): 19.01s, 02/01/23 = 14.28 sec  GAIT: Distance walked: in clinic distances Assistive device utilized: None Level of assistance: Modified independence Comments: flexed trunk, decreased foot clearance, forward head, kyphotic posture, increased posterior pelvic tilt, slowed speed   TODAY'S TREATMENT:                                                                                                                              DATE:  02/26/23 Bike L3 x2 mins NuStep L5 x64mins  Leg ext 10# 2x10  HS curls 35# 2x10 Seated rows 35# 3x10 Lat pull 25# 3x10  W backs against wall x10 Horizontal abd green 2x10  02/22/23 NuStep L 5 x 6 min Seated rows  35# 2x12 Lat pull down 25# 2x12 S2S OHP blue ball 2x10 Slant board calf stretch   02/20/23 NuStep L5 x6 min Seated rows 35# 2x12 Lat pull down 25# 2x12 blackTB ext 2x10 Pec stretch in doorway 10s x3 S2S holding blue ball 2x10 Horiz Abd blue 2x10 Calf stretch   02/15/23 NuStep L5 x40mins  blackTB ext 2x10 Seated rows 25# 2x10 Lat pull down 25# 2x10 Leg press 20# x10, 30# x10   02/13/23 Progress note NuStep L5 x69mins  UBE L2 x71mins each way  Pec stretch in doorway  30s x2 Scapular retraction with red band against wall 2x10 Horizontal abd with red band against wall 2x10  Shoulder ext 10#  2x10 Seated trunk rotation reach outs x5 each side  Prone on elbows x5, then hip ext 2x5 each side    02/08/23 NuStep L5 x8 mins  STS with shoulder flexion 2x10 2# WaTE  HS curls 35lb 2x10 Leg Ext 10lb 2x10 Seated Rows and Lats 25lb 2x10  blackTB ext 2x10 Horiz Abd green 2x10  02/06/23 NuStep L5 x 8 min S2S OHP blue ball 2x10 Slant board calf stretch 4x10'' Shoulder Ext 10lb 2x10 for core  HS curls 35lb 2x10 Leg Ext 10lb 2x10 Horiz Abd green 2x10   02/01/23 NuStep L 5 x7 min HS curls 35lb 2x10 Leg Ext 10lb 2x10 S2S OHP yellow ball 2x10 Standing shoulder Ext 2lb 2x10 Seated Trunk rotations yellow ball x10    01/30/23 NuStep L5 x 7 min Resisted side steps 20lb x 5 each S2S OHP yellow ball 2x10 Seated Trunk rotations yellow ball x10  Seated Rows and Lats 25lb 2x10  Shoulder Ext blue 2x10  01/24/23 TherEx  UBE L1 backwards only for postural muscle activation x6 minutes Rows 25# 2x10  Trunk extensions against wall x3- limited by pain LLE  Thoracic extensions seated x10 Thoracic lateral flexion x10 B Thoracic rotation x10 B HS stretches 3x30 seconds B Piriformis stretch 2x30 seconds B  Lumbar rotation stretch 5x5 seconds B Attempted bridges- unable to tolerate due to pain QL stretch 3x15 seconds     01/23/23 NuStep L 5 x 7 min S2S OHP yellow ball 2x10 Standing rows 10lb 2x10 W backs Trunk rotations Shoulder Ext 5lb 2x10  MHP on lumbar spine  Passive stretching to HS, piriformis, Glute, ITB    01/18/23 NuStep L 6 x 7 min S2S OHP form elevated surface 2x10 Facing wall overhead ball Ext  Back against wall overhead Ext HS curls 25lb 2x10 Leg Ext 10lb 2x10 Shoulder Ext 5lb 2x10  MHP on lumbar spine  Bridges   SLR  Piriformis stretch    PATIENT EDUCATION:  Education details: POC and HEP Person educated: Patient and  Child(ren) Education method: Explanation Education comprehension: verbalized understanding  HOME EXERCISE PROGRAM: Access Code: [email or web address] URL: [email or web address] Date: 12/04/2022 Prepared by: Cassie Freer  Exercises - Supine Lower Trunk Rotation  - 1 x daily - 7 x weekly - 2 sets - 10 reps - Supine Bridge  - 1 x daily - 7 x weekly - 2 sets - 10 reps - Supine Single Knee to Chest Stretch  - 2 x daily - 7 x weekly - 30 hold - Supine Active Straight Leg Raise  - 1 x daily - 7 x weekly - 2 sets - 10 reps - Seated Hamstring Stretch  - 2 x daily - 7 x weekly - 30 hold  ASSESSMENT:  CLINICAL IMPRESSION: Pt has progressed towards long term goals. Pt also reports that  he ha been [facility] the [facility] on his own on the days he does not come in here. Continued with interventions that focused on postural correction. Pt has severe postural limitations that's could possible contribute to his pain. Patient would like to continue with PT for at least another month before being completely independent with his gym program.   OBJECTIVE IMPAIRMENTS: Abnormal gait, difficulty walking, decreased strength, improper body mechanics, postural dysfunction, and pain.   ACTIVITY LIMITATIONS: bending, sitting, standing, stairs, and locomotion level   REHAB POTENTIAL: Good  CLINICAL DECISION MAKING: Stable/uncomplicated  EVALUATION COMPLEXITY: Low  GOALS: Goals reviewed with patient? Yes  SHORT TERM GOALS: Target date: 01/15/23  Patient will be independent with initial HEP.  Goal status: Met 12/21/22  2.  Patient will report centralization of radicular symptoms.  Baseline: radiating pain in to leg Goal status: ongoing 02/13/23  3.  Patient will demonstrate TUG < 15s  Baseline: 19.01  Goal status: 14.82 Met  01/09/23    LONG TERM GOALS: Target date: 03/26/23  Patient will be independent with advanced/ongoing HEP to improve outcomes and carryover.  Goal status: INITIAL  2.  Patient will  report 75% improvement in low back pain to improve QOL.  Baseline: 7/10 Goal status: Progressing 02/01/23, 30% better 02/13/23, 60-80% Partly met 02/22/23  3.  Patient will demonstrate full pain free lumbar ROM to perform ADLs.   Goal status: On going 01/09/23, ongoing 02/13/23, Progressing 02/22/23  4.  Patient to demonstrate ability to achieve and maintain good spinal alignment/posturing and body mechanics needed for daily activities. Baseline: forward head, kyphosis, posterior pelvis tilt Goal status: on going 02/01/23, progressing 02/13/23  PLAN:  PT FREQUENCY: 2x/week  PT DURATION: 12 weeks  PLANNED INTERVENTIONS: Therapeutic exercises, Therapeutic activity, Neuromuscular re-education, Balance training, Gait training, Patient/Family education, Self Care, Joint mobilization, Joint manipulation, Stair training, Dry Needling, Electrical stimulation, Spinal manipulation, Spinal mobilization, Cryotherapy, Moist heat, and Manual therapy.  PLAN FOR NEXT SESSION: postural exercises for upper back and pelvis, try more prone exercises  Cassie Freer, DPT 02/26/23 10:53 AM

## 2023-02-26 ENCOUNTER — Ambulatory Visit (INDEPENDENT_AMBULATORY_CARE_PROVIDER_SITE_OTHER): Payer: Medicare Other

## 2023-02-26 ENCOUNTER — Ambulatory Visit: Payer: Medicare Other

## 2023-02-26 VITALS — BP 145/62 | HR 74 | Temp 98.0°F | Resp 18 | Ht 63.0 in | Wt 157.2 lb

## 2023-02-26 DIAGNOSIS — R293 Abnormal posture: Secondary | ICD-10-CM | POA: Diagnosis not present

## 2023-02-26 DIAGNOSIS — M5442 Lumbago with sciatica, left side: Secondary | ICD-10-CM | POA: Diagnosis not present

## 2023-02-26 DIAGNOSIS — M5459 Other low back pain: Secondary | ICD-10-CM | POA: Diagnosis not present

## 2023-02-26 DIAGNOSIS — M810 Age-related osteoporosis without current pathological fracture: Secondary | ICD-10-CM

## 2023-02-26 DIAGNOSIS — M6281 Muscle weakness (generalized): Secondary | ICD-10-CM

## 2023-02-26 MED ORDER — DENOSUMAB 60 MG/ML ~~LOC~~ SOSY
60.0000 mg | PREFILLED_SYRINGE | Freq: Once | SUBCUTANEOUS | Status: AC
  Administered 2023-02-26: 60 mg via SUBCUTANEOUS
  Filled 2023-02-26: qty 1

## 2023-02-26 NOTE — Progress Notes (Signed)
Diagnosis: Osteoporosis  Provider:  Mannam, Praveen MD  Procedure: Injection  Prolia (Denosumab), Dose: 60 mg, Site: subcutaneous, Number of injections: 1  Post Care: Patient declined observation  Discharge: Condition: Good, Destination: Home . AVS Provided  Performed by:   , RN       

## 2023-02-28 ENCOUNTER — Encounter: Payer: Self-pay | Admitting: Physical Therapy

## 2023-02-28 ENCOUNTER — Ambulatory Visit: Payer: Medicare Other | Admitting: Physical Therapy

## 2023-02-28 DIAGNOSIS — M5442 Lumbago with sciatica, left side: Secondary | ICD-10-CM | POA: Diagnosis not present

## 2023-02-28 DIAGNOSIS — M6281 Muscle weakness (generalized): Secondary | ICD-10-CM | POA: Diagnosis not present

## 2023-02-28 DIAGNOSIS — M5459 Other low back pain: Secondary | ICD-10-CM | POA: Diagnosis not present

## 2023-02-28 DIAGNOSIS — R293 Abnormal posture: Secondary | ICD-10-CM | POA: Diagnosis not present

## 2023-02-28 NOTE — Therapy (Signed)
OUTPATIENT PHYSICAL THERAPY THORACOLUMBAR TREATMENT    Patient Name: Cameron Todd MRN: [id number] DOB:[birth year], 89 y.o., male Today's Date: 02/28/2023  END OF SESSION:  PT End of Session - 02/28/23 1524     Visit Number 25    PT Start Time 1524    PT Stop Time 1600    PT Time Calculation (min) 36 min                  Past Medical History:  Diagnosis Date   Arthritis    Barrett's esophagus    Compression fracture of spine (HCC)    T 10   Diabetes (HCC)    Elevated cholesterol    Gastric ulcer 1990   Hemorrhoids    History of colon polyps    Hypertension    Tubular adenoma of colon    Past Surgical History:  Procedure Laterality Date   cataract surgery bilateral     NO PAST SURGERIES     UPPER GASTROINTESTINAL ENDOSCOPY     Patient Active Problem List   Diagnosis Date Noted   Age related osteoporosis 11/09/2020   Cold intolerance 11/09/2020   Polyneuropathy 11/09/2020   Vitamin D deficiency 11/09/2020   Dorsalgia 11/09/2020   Controlled type 2 diabetes mellitus without complication, without long-term current use of insulin (HCC) 11/09/2020   Hypertensive heart and renal disease 11/09/2020   Benign prostatic hyperplasia with urinary hesitancy 11/09/2020   Barrett's esophagus 05/20/2013   HTN (hypertension) 05/20/2013   GENERALIZED OSTEOARTHROSIS UNSPECIFIED SITE 06/10/2008   EPIGASTRIC PAIN 06/10/2008   GASTRIC ULCER, HX OF 06/10/2008   INTERNAL HEMORRHOIDS 06/08/2008    PCP: Nadene Rubins  REFERRING PROVIDER: Coletta Memos  REFERRING DIAG:  M54.42 (ICD-10-CM) - Lumbago with sciatica, left side    Rationale for Evaluation and Treatment: Rehabilitation  THERAPY DIAG:  Abnormal posture  Left-sided low back pain with left-sided sciatica, unspecified chronicity  Other low back pain  Muscle weakness (generalized)  ONSET DATE: 11/27/22  SUBJECTIVE:                                                                                                                                                                                            SUBJECTIVE STATEMENT: I am limping a little bit today, pain in the L glute  PERTINENT HISTORY:  No surgical hx, arthritis   PAIN:  Are you having pain? Yes: NPRS scale: 5/10 Pain location: L hip down my leg, low back Pain description: achy, sharp, radiating down leg, constant and never goes away, numbness Aggravating factors: standing, walking Relieving factors: just the medicine but only for a short time  PRECAUTIONS: None  WEIGHT BEARING RESTRICTIONS: No  FALLS:  Has patient fallen in last 6 months? No  LIVING ENVIRONMENT: Lives with: lives with their spouse Lives in: House/apartment Stairs: Yes: External: 3-4 steps; on left going up Has following equipment at home: None  OCCUPATION: Retired  PLOF: Independent and Independent with basic ADLs  PATIENT GOALS: some how get rid of the pain in my back and leg   NEXT MD VISIT: 12/11/22- spine doctor for injection  OBJECTIVE:   DIAGNOSTIC FINDINGS:  IMPRESSION: 1. Left lateral recess narrowing at L4-L5 and L5-S1, which may be a source of left L5 and/or S1 radiculopathy. 2. No central spinal canal or neural foraminal stenosis. 3. Chronic compression deformity of T11 with mild narrowing of the ventral thecal sac.   SCREENING FOR RED FLAGS: Bowel or bladder incontinence: No Spinal tumors: No Cauda equina syndrome: No Compression fracture: No Abdominal aneurysm: No  COGNITION: Overall cognitive status: Within functional limits for tasks assessed     SENSATION: WFL  MUSCLE LENGTH: Hamstrings: extremely tight in BLE  POSTURE: rounded shoulders, forward head, increased thoracic kyphosis, posterior pelvic tilt, and flexed trunk   PALPATION: TTP SIJ, L4-L5  LUMBAR ROM:   AROM eval 12/21/22 01/09/23 02/01/23 02/13/23 02/22/23  Flexion 75% pain in low back [facility] WFL  Extension 25% with pain Limited 50% with pain Limited  25% Limited 25% Only 25% movement Limited 25%  Right lateral flexion Fib head  [facility] WFL  Left lateral flexion Fib head, some pain WFL with pain [facility] WFL  Right rotation 25% with pain Limited 75% Limited 50% Limited 50% Limited 50% Limited 25%  Left rotation 25% with pain Limited 75% Limited 50% Limited 25% Limited 50% Limited 25%   (Blank rows = not tested)  LOWER EXTREMITY ROM:   grossly WFL    LOWER EXTREMITY MMT:  grossly 4-4+/5    LUMBAR SPECIAL TESTS:  Straight leg raise test: Positive and Slump test: Negative  FUNCTIONAL TESTS:  5 times sit to stand: 14.34s, 02/01/23 =11.33 sec Timed up and go (TUG): 19.01s, 02/01/23 = 14.28 sec  GAIT: Distance walked: in clinic distances Assistive device utilized: None Level of assistance: Modified independence Comments: flexed trunk, decreased foot clearance, forward head, kyphotic posture, increased posterior pelvic tilt, slowed speed   TODAY'S TREATMENT:                                                                                                                              DATE:  02/28/23 NuStep L 5 x6 min 20lb resisted side steps x5 each  Horizontal abd blue 2x10 S2S OHP yellow ball 2x10 W backs against wall x10  02/26/23 Bike L3 x2 mins NuStep L5 x26mins  Leg ext 10# 2x10  HS curls 35# 2x10 Seated rows 35# 3x10 Lat pull 25# 3x10  W backs against wall x10 Horizontal abd green 2x10  02/22/23 NuStep L 5 x  6 min Seated rows 35# 2x12 Lat pull down 25# 2x12 S2S OHP blue ball 2x10 Slant board calf stretch   02/20/23 NuStep L5 x6 min Seated rows 35# 2x12 Lat pull down 25# 2x12 blackTB ext 2x10 Pec stretch in doorway 10s x3 S2S holding blue ball 2x10 Horiz Abd blue 2x10 Calf stretch   02/15/23 NuStep L5 x82mins  blackTB ext 2x10 Seated rows 25# 2x10 Lat pull down 25# 2x10 Leg press 20# x10, 30# x10   02/13/23 Progress note NuStep L5 x24mins  UBE L2 x21mins each way  Pec stretch in doorway 30s  x2 Scapular retraction with red band against wall 2x10 Horizontal abd with red band against wall 2x10  Shoulder ext 10#  2x10 Seated trunk rotation reach outs x5 each side  Prone on elbows x5, then hip ext 2x5 each side    02/08/23 NuStep L5 x8 mins  STS with shoulder flexion 2x10 2# WaTE  HS curls 35lb 2x10 Leg Ext 10lb 2x10 Seated Rows and Lats 25lb 2x10  blackTB ext 2x10 Horiz Abd green 2x10  02/06/23 NuStep L5 x 8 min S2S OHP blue ball 2x10 Slant board calf stretch 4x10'' Shoulder Ext 10lb 2x10 for core  HS curls 35lb 2x10 Leg Ext 10lb 2x10 Horiz Abd green 2x10   02/01/23 NuStep L 5 x7 min HS curls 35lb 2x10 Leg Ext 10lb 2x10 S2S OHP yellow ball 2x10 Standing shoulder Ext 2lb 2x10 Seated Trunk rotations yellow ball x10    01/30/23 NuStep L5 x 7 min Resisted side steps 20lb x 5 each S2S OHP yellow ball 2x10 Seated Trunk rotations yellow ball x10  Seated Rows and Lats 25lb 2x10  Shoulder Ext blue 2x10    PATIENT EDUCATION:  Education details: POC and HEP Person educated: Patient and Child(ren) Education method: Explanation Education comprehension: verbalized understanding  HOME EXERCISE PROGRAM: Access Code: [email or web address] URL: [email or web address] Date: 12/04/2022 Prepared by: Cassie Freer  Exercises - Supine Lower Trunk Rotation  - 1 x daily - 7 x weekly - 2 sets - 10 reps - Supine Bridge  - 1 x daily - 7 x weekly - 2 sets - 10 reps - Supine Single Knee to Chest Stretch  - 2 x daily - 7 x weekly - 30 hold - Supine Active Straight Leg Raise  - 1 x daily - 7 x weekly - 2 sets - 10 reps - Seated Hamstring Stretch  - 2 x daily - 7 x weekly - 30 hold  ASSESSMENT:  CLINICAL IMPRESSION: Pt enters ~ 9 minute late for today's session. Pt also [facility] three days a week.  Continued with interventions that focused on postural correction. Pt has severe postural limitations that's could possible  contribute to his pain. Overpressure given to W backs to increase ROM and stretch. Pt has good control with resisted side steps.   OBJECTIVE IMPAIRMENTS: Abnormal gait, difficulty walking, decreased strength, improper body mechanics, postural dysfunction, and pain.   ACTIVITY LIMITATIONS: bending, sitting, standing, stairs, and locomotion level   REHAB POTENTIAL: Good  CLINICAL DECISION MAKING: Stable/uncomplicated  EVALUATION COMPLEXITY: Low  GOALS: Goals reviewed with patient? Yes  SHORT TERM GOALS: Target date: 01/15/23  Patient will be independent with initial HEP.  Goal status: Met 12/21/22  2.  Patient will report centralization of radicular symptoms.  Baseline: radiating pain in to leg Goal status: ongoing 02/13/23  3.  Patient will demonstrate TUG < 15s  Baseline: 19.01  Goal status: 14.82 Met  01/09/23    LONG TERM GOALS: Target date: 03/26/23  Patient will be independent with advanced/ongoing HEP to improve outcomes and carryover.  Goal status: INITIAL  2.  Patient will report 75% improvement in low back pain to improve QOL.  Baseline: 7/10 Goal status: Progressing 02/01/23, 30% better 02/13/23, 60-80% Partly met 02/22/23  3.  Patient will demonstrate full pain free lumbar ROM to perform ADLs.   Goal status: On going 01/09/23, ongoing 02/13/23, Progressing 02/22/23  4.  Patient to demonstrate ability to achieve and maintain good spinal alignment/posturing and body mechanics needed for daily activities. Baseline: forward head, kyphosis, posterior pelvis tilt Goal status: on going 02/01/23, progressing 02/13/23  PLAN:  PT FREQUENCY: 2x/week  PT DURATION: 12 weeks  PLANNED INTERVENTIONS: Therapeutic exercises, Therapeutic activity, Neuromuscular re-education, Balance training, Gait training, Patient/Family education, Self Care, Joint mobilization, Joint manipulation, Stair training, Dry Needling, Electrical stimulation, Spinal manipulation, Spinal mobilization,  Cryotherapy, Moist heat, and Manual therapy.  PLAN FOR NEXT SESSION: postural exercises for upper back and pelvis, try more prone exercises  Cassie Freer, DPT 02/28/23 3:27 PM

## 2023-03-05 NOTE — Therapy (Signed)
OUTPATIENT PHYSICAL THERAPY THORACOLUMBAR TREATMENT    Patient Name: Cameron Todd MRN: [id number] DOB:10/11/1932, 89 y.o., male Today's Date: 03/06/2023  END OF SESSION:  PT End of Session - 03/06/23 1541     Visit Number 26    Date for PT Re-Evaluation 03/26/23    PT Start Time 1540    PT Stop Time 1625    PT Time Calculation (min) 45 min    Activity Tolerance Patient tolerated treatment well    Behavior During Therapy WFL for tasks assessed/performed                   Past Medical History:  Diagnosis Date   Arthritis    Barrett's esophagus    Compression fracture of spine (HCC)    T 10   Diabetes (HCC)    Elevated cholesterol    Gastric ulcer 1990   Hemorrhoids    History of colon polyps    Hypertension    Tubular adenoma of colon    Past Surgical History:  Procedure Laterality Date   cataract surgery bilateral     NO PAST SURGERIES     UPPER GASTROINTESTINAL ENDOSCOPY     Patient Active Problem List   Diagnosis Date Noted   Age related osteoporosis 11/09/2020   Cold intolerance 11/09/2020   Polyneuropathy 11/09/2020   Vitamin D deficiency 11/09/2020   Dorsalgia 11/09/2020   Controlled type 2 diabetes mellitus without complication, without long-term current use of insulin (HCC) 11/09/2020   Hypertensive heart and renal disease 11/09/2020   Benign prostatic hyperplasia with urinary hesitancy 11/09/2020   Barrett's esophagus 05/20/2013   HTN (hypertension) 05/20/2013   GENERALIZED OSTEOARTHROSIS UNSPECIFIED SITE 06/10/2008   EPIGASTRIC PAIN 06/10/2008   GASTRIC ULCER, HX OF 06/10/2008   INTERNAL HEMORRHOIDS 06/08/2008    PCP: Nadene Rubins  REFERRING PROVIDER: Coletta Memos  REFERRING DIAG:  M54.42 (ICD-10-CM) - Lumbago with sciatica, left side    Rationale for Evaluation and Treatment: Rehabilitation  THERAPY DIAG:  Abnormal posture  Left-sided low back pain with left-sided sciatica, unspecified chronicity  Other low back  pain  Muscle weakness (generalized)  ONSET DATE: 11/27/22  SUBJECTIVE:                                                                                                                                                                                           SUBJECTIVE STATEMENT: I can't put weight through my left leg, limping still.   PERTINENT HISTORY:  No surgical hx, arthritis   PAIN:  Are you having pain? Yes: NPRS scale: 5/10 Pain location: L hip down my leg, low back Pain  description: achy, sharp, radiating down leg, constant and never goes away, numbness Aggravating factors: standing, walking Relieving factors: just the medicine but only for a short time  PRECAUTIONS: None  WEIGHT BEARING RESTRICTIONS: No  FALLS:  Has patient fallen in last 6 months? No  LIVING ENVIRONMENT: Lives with: lives with their spouse Lives in: House/apartment Stairs: Yes: External: 3-4 steps; on left going up Has following equipment at home: None  OCCUPATION: Retired  PLOF: Independent and Independent with basic ADLs  PATIENT GOALS: some how get rid of the pain in my back and leg   NEXT MD VISIT: 12/11/22- spine doctor for injection  OBJECTIVE:   DIAGNOSTIC FINDINGS:  IMPRESSION: 1. Left lateral recess narrowing at L4-L5 and L5-S1, which may be a source of left L5 and/or S1 radiculopathy. 2. No central spinal canal or neural foraminal stenosis. 3. Chronic compression deformity of T11 with mild narrowing of the ventral thecal sac.   SCREENING FOR RED FLAGS: Bowel or bladder incontinence: No Spinal tumors: No Cauda equina syndrome: No Compression fracture: No Abdominal aneurysm: No  COGNITION: Overall cognitive status: Within functional limits for tasks assessed     SENSATION: WFL  MUSCLE LENGTH: Hamstrings: extremely tight in BLE  POSTURE: rounded shoulders, forward head, increased thoracic kyphosis, posterior pelvic tilt, and flexed trunk   PALPATION: TTP SIJ,  L4-L5  LUMBAR ROM:   AROM eval 12/21/22 01/09/23 02/01/23 02/13/23 02/22/23  Flexion 75% pain in low back [facility] WFL  Extension 25% with pain Limited 50% with pain Limited 25% Limited 25% Only 25% movement Limited 25%  Right lateral flexion Fib head  [facility] WFL  Left lateral flexion Fib head, some pain WFL with pain [facility] WFL  Right rotation 25% with pain Limited 75% Limited 50% Limited 50% Limited 50% Limited 25%  Left rotation 25% with pain Limited 75% Limited 50% Limited 25% Limited 50% Limited 25%   (Blank rows = not tested)  LOWER EXTREMITY ROM:   grossly WFL    LOWER EXTREMITY MMT:  grossly 4-4+/5    LUMBAR SPECIAL TESTS:  Straight leg raise test: Positive and Slump test: Negative  FUNCTIONAL TESTS:  5 times sit to stand: 14.34s, 02/01/23 =11.33 sec Timed up and go (TUG): 19.01s, 02/01/23 = 14.28 sec  GAIT: Distance walked: in clinic distances Assistive device utilized: None Level of assistance: Modified independence Comments: flexed trunk, decreased foot clearance, forward head, kyphotic posture, increased posterior pelvic tilt, slowed speed   TODAY'S TREATMENT:                                                                                                                              DATE:  03/06/23 Bike L3 x22mins STS with WaTE shoulder flexion OH 2x12 Resisted ER green 2x10 Horizontal abd green 2x10 Prone STM to L glute/hip  Prone hip extension 2x10 Prone cobra on elbows x10  Seated pball flexion and rotation x10  02/28/23 NuStep L 5 x6 min 20lb resisted side steps x5 each  Horizontal abd blue 2x10 S2S OHP yellow ball 2x10 W backs against wall x10  02/26/23 Bike L3 x2 mins NuStep L5 x43mins  Leg ext 10# 2x10  HS curls 35# 2x10 Seated rows 35# 3x10 Lat pull 25# 3x10  W backs against wall x10 Horizontal abd green 2x10  02/22/23 NuStep L 5 x 6 min Seated rows 35# 2x12 Lat pull down 25# 2x12 S2S OHP blue ball 2x10 Slant board calf  stretch   02/20/23 NuStep L5 x6 min Seated rows 35# 2x12 Lat pull down 25# 2x12 blackTB ext 2x10 Pec stretch in doorway 10s x3 S2S holding blue ball 2x10 Horiz Abd blue 2x10 Calf stretch   02/15/23 NuStep L5 x28mins  blackTB ext 2x10 Seated rows 25# 2x10 Lat pull down 25# 2x10 Leg press 20# x10, 30# x10   02/13/23 Progress note NuStep L5 x89mins  UBE L2 x28mins each way  Pec stretch in doorway 30s x2 Scapular retraction with red band against wall 2x10 Horizontal abd with red band against wall 2x10  Shoulder ext 10#  2x10 Seated trunk rotation reach outs x5 each side  Prone on elbows x5, then hip ext 2x5 each side    02/08/23 NuStep L5 x8 mins  STS with shoulder flexion 2x10 2# WaTE  HS curls 35lb 2x10 Leg Ext 10lb 2x10 Seated Rows and Lats 25lb 2x10  blackTB ext 2x10 Horiz Abd green 2x10  02/06/23 NuStep L5 x 8 min S2S OHP blue ball 2x10 Slant board calf stretch 4x10'' Shoulder Ext 10lb 2x10 for core  HS curls 35lb 2x10 Leg Ext 10lb 2x10 Horiz Abd green 2x10   02/01/23 NuStep L 5 x7 min HS curls 35lb 2x10 Leg Ext 10lb 2x10 S2S OHP yellow ball 2x10 Standing shoulder Ext 2lb 2x10 Seated Trunk rotations yellow ball x10    01/30/23 NuStep L5 x 7 min Resisted side steps 20lb x 5 each S2S OHP yellow ball 2x10 Seated Trunk rotations yellow ball x10  Seated Rows and Lats 25lb 2x10  Shoulder Ext blue 2x10    PATIENT EDUCATION:  Education details: POC and HEP Person educated: Patient and Child(ren) Education method: Explanation Education comprehension: verbalized understanding  HOME EXERCISE PROGRAM: Access Code: [email or web address] URL: [email or web address] Date: 12/04/2022 Prepared by: Cassie Freer  Exercises - Supine Lower Trunk Rotation  - 1 x daily - 7 x weekly - 2 sets - 10 reps - Supine Bridge  - 1 x daily - 7 x weekly - 2 sets - 10 reps - Supine Single Knee to Chest Stretch  - 2 x daily - 7 x weekly - 30 hold - Supine Active Straight Leg  Raise  - 1 x daily - 7 x weekly - 2 sets - 10 reps - Seated Hamstring Stretch  - 2 x daily - 7 x weekly - 30 hold  ASSESSMENT:  CLINICAL IMPRESSION: Pt enters ~ 9 minute late for today's session. Pt also reports that [facility] three days a week.  Continued with interventions that focused on postural correction. Pt has severe postural limitations that's could possible contribute to his pain. Overpressure given to W backs to increase ROM and stretch. Pt has good control with resisted side steps.   OBJECTIVE IMPAIRMENTS: Abnormal gait, difficulty walking, decreased strength, improper body mechanics, postural dysfunction, and pain.   ACTIVITY LIMITATIONS: bending, sitting, standing, stairs, and locomotion level   REHAB POTENTIAL:  Good  CLINICAL DECISION MAKING: Stable/uncomplicated  EVALUATION COMPLEXITY: Low  GOALS: Goals reviewed with patient? Yes  SHORT TERM GOALS: Target date: 01/15/23  Patient will be independent with initial HEP.  Goal status: Met 12/21/22  2.  Patient will report centralization of radicular symptoms.  Baseline: radiating pain in to leg Goal status: ongoing 02/13/23  3.  Patient will demonstrate TUG < 15s  Baseline: 19.01  Goal status: 14.82 Met  01/09/23    LONG TERM GOALS: Target date: 03/26/23  Patient will be independent with advanced/ongoing HEP to improve outcomes and carryover.  Goal status: INITIAL  2.  Patient will report 75% improvement in low back pain to improve QOL.  Baseline: 7/10 Goal status: Progressing 02/01/23, 30% better 02/13/23, 60-80% Partly met 02/22/23  3.  Patient will demonstrate full pain free lumbar ROM to perform ADLs.   Goal status: On going 01/09/23, ongoing 02/13/23, Progressing 02/22/23  4.  Patient to demonstrate ability to achieve and maintain good spinal alignment/posturing and body mechanics needed for daily activities. Baseline: forward head, kyphosis, posterior pelvis tilt Goal  status: on going 02/01/23, progressing 02/13/23  PLAN:  PT FREQUENCY: 2x/week  PT DURATION: 12 weeks  PLANNED INTERVENTIONS: Therapeutic exercises, Therapeutic activity, Neuromuscular re-education, Balance training, Gait training, Patient/Family education, Self Care, Joint mobilization, Joint manipulation, Stair training, Dry Needling, Electrical stimulation, Spinal manipulation, Spinal mobilization, Cryotherapy, Moist heat, and Manual therapy.  PLAN FOR NEXT SESSION: postural exercises for upper back and pelvis, try more prone exercises  Cassie Freer, DPT 03/06/23 4:21 PM

## 2023-03-06 ENCOUNTER — Ambulatory Visit: Payer: Medicare Other

## 2023-03-06 DIAGNOSIS — M5459 Other low back pain: Secondary | ICD-10-CM | POA: Diagnosis not present

## 2023-03-06 DIAGNOSIS — M5442 Lumbago with sciatica, left side: Secondary | ICD-10-CM | POA: Diagnosis not present

## 2023-03-06 DIAGNOSIS — R293 Abnormal posture: Secondary | ICD-10-CM

## 2023-03-06 DIAGNOSIS — M6281 Muscle weakness (generalized): Secondary | ICD-10-CM | POA: Diagnosis not present

## 2023-03-08 ENCOUNTER — Ambulatory Visit: Payer: Medicare Other | Admitting: Physical Therapy

## 2023-03-08 ENCOUNTER — Encounter: Payer: Self-pay | Admitting: Physical Therapy

## 2023-03-08 DIAGNOSIS — M5442 Lumbago with sciatica, left side: Secondary | ICD-10-CM

## 2023-03-08 DIAGNOSIS — M5459 Other low back pain: Secondary | ICD-10-CM | POA: Diagnosis not present

## 2023-03-08 DIAGNOSIS — R293 Abnormal posture: Secondary | ICD-10-CM | POA: Diagnosis not present

## 2023-03-08 DIAGNOSIS — M6281 Muscle weakness (generalized): Secondary | ICD-10-CM | POA: Diagnosis not present

## 2023-03-08 NOTE — Therapy (Signed)
OUTPATIENT PHYSICAL THERAPY THORACOLUMBAR TREATMENT    Patient Name: Cameron Todd MRN: [id number] DOB:1932-11-23, 89 y.o., male Today's Date: 03/08/2023  END OF SESSION:  PT End of Session - 03/08/23 1603     Visit Number 27    Date for PT Re-Evaluation 03/26/23    PT Start Time 1603    PT Stop Time 1645    PT Time Calculation (min) 42 min    Activity Tolerance Patient tolerated treatment well    Behavior During Therapy WFL for tasks assessed/performed                   Past Medical History:  Diagnosis Date   Arthritis    Barrett's esophagus    Compression fracture of spine (HCC)    T 10   Diabetes (HCC)    Elevated cholesterol    Gastric ulcer 1990   Hemorrhoids    History of colon polyps    Hypertension    Tubular adenoma of colon    Past Surgical History:  Procedure Laterality Date   cataract surgery bilateral     NO PAST SURGERIES     UPPER GASTROINTESTINAL ENDOSCOPY     Patient Active Problem List   Diagnosis Date Noted   Age related osteoporosis 11/09/2020   Cold intolerance 11/09/2020   Polyneuropathy 11/09/2020   Vitamin D deficiency 11/09/2020   Dorsalgia 11/09/2020   Controlled type 2 diabetes mellitus without complication, without long-term current use of insulin (HCC) 11/09/2020   Hypertensive heart and renal disease 11/09/2020   Benign prostatic hyperplasia with urinary hesitancy 11/09/2020   Barrett's esophagus 05/20/2013   HTN (hypertension) 05/20/2013   GENERALIZED OSTEOARTHROSIS UNSPECIFIED SITE 06/10/2008   EPIGASTRIC PAIN 06/10/2008   GASTRIC ULCER, HX OF 06/10/2008   INTERNAL HEMORRHOIDS 06/08/2008    PCP: Nadene Rubins  REFERRING PROVIDER: Coletta Memos  REFERRING DIAG:  M54.42 (ICD-10-CM) - Lumbago with sciatica, left side    Rationale for Evaluation and Treatment: Rehabilitation  THERAPY DIAG:  Abnormal posture  Left-sided low back pain with left-sided sciatica, unspecified chronicity  Other low back  pain  Muscle weakness (generalized)  ONSET DATE: 11/27/22  SUBJECTIVE:                                                                                                                                                                                           SUBJECTIVE STATEMENT: Pain in the L buttocks that goes down L leg  PERTINENT HISTORY:  No surgical hx, arthritis   PAIN:  Are you having pain? Yes: NPRS scale: 6/10 Pain location: L hip down my leg, low back Pain description:  achy, sharp, radiating down leg, constant and never goes away, numbness Aggravating factors: standing, walking Relieving factors: just the medicine but only for a short time  PRECAUTIONS: None  WEIGHT BEARING RESTRICTIONS: No  FALLS:  Has patient fallen in last 6 months? No  LIVING ENVIRONMENT: Lives with: lives with their spouse Lives in: House/apartment Stairs: Yes: External: 3-4 steps; on left going up Has following equipment at home: None  OCCUPATION: Retired  PLOF: Independent and Independent with basic ADLs  PATIENT GOALS: some how get rid of the pain in my back and leg   NEXT MD VISIT: 12/11/22- spine doctor for injection  OBJECTIVE:   DIAGNOSTIC FINDINGS:  IMPRESSION: 1. Left lateral recess narrowing at L4-L5 and L5-S1, which may be a source of left L5 and/or S1 radiculopathy. 2. No central spinal canal or neural foraminal stenosis. 3. Chronic compression deformity of T11 with mild narrowing of the ventral thecal sac.   SCREENING FOR RED FLAGS: Bowel or bladder incontinence: No Spinal tumors: No Cauda equina syndrome: No Compression fracture: No Abdominal aneurysm: No  COGNITION: Overall cognitive status: Within functional limits for tasks assessed     SENSATION: WFL  MUSCLE LENGTH: Hamstrings: extremely tight in BLE  POSTURE: rounded shoulders, forward head, increased thoracic kyphosis, posterior pelvic tilt, and flexed trunk   PALPATION: TTP SIJ, L4-L5  LUMBAR ROM:    AROM eval 12/21/22 01/09/23 02/01/23 02/13/23 02/22/23  Flexion 75% pain in low back [facility] WFL  Extension 25% with pain Limited 50% with pain Limited 25% Limited 25% Only 25% movement Limited 25%  Right lateral flexion Fib head  [facility] WFL  Left lateral flexion Fib head, some pain WFL with pain [facility] WFL  Right rotation 25% with pain Limited 75% Limited 50% Limited 50% Limited 50% Limited 25%  Left rotation 25% with pain Limited 75% Limited 50% Limited 25% Limited 50% Limited 25%   (Blank rows = not tested)  LOWER EXTREMITY ROM:   grossly WFL    LOWER EXTREMITY MMT:  grossly 4-4+/5    LUMBAR SPECIAL TESTS:  Straight leg raise test: Positive and Slump test: Negative  FUNCTIONAL TESTS:  5 times sit to stand: 14.34s, 02/01/23 =11.33 sec Timed up and go (TUG): 19.01s, 02/01/23 = 14.28 sec  GAIT: Distance walked: in clinic distances Assistive device utilized: None Level of assistance: Modified independence Comments: flexed trunk, decreased foot clearance, forward head, kyphotic posture, increased posterior pelvic tilt, slowed speed   TODAY'S TREATMENT:                                                                                                                              DATE:  03/08/23 NuStep L 5 x 6 min  UBE L 2. 2 min each 20lb resisted side steps x5 each  S2S OHP yellow ball 2x10 Horizontal abd green 2x10 W backs against wall x10 STM to L glute area   03/06/23  Bike L3 x55mins STS with WaTE shoulder flexion OH 2x12 Resisted ER green 2x10 Horizontal abd green 2x10 Prone STM to L glute/hip  Prone hip extension 2x10 Prone cobra on elbows x10  Seated pball flexion and rotation x10    02/28/23 NuStep L 5 x6 min 20lb resisted side steps x5 each  Horizontal abd blue 2x10 S2S OHP yellow ball 2x10 W backs against wall x10  02/26/23 Bike L3 x2 mins NuStep L5 x35mins  Leg ext 10# 2x10  HS curls 35# 2x10 Seated rows 35# 3x10 Lat pull 25# 3x10  W  backs against wall x10 Horizontal abd green 2x10  02/22/23 NuStep L 5 x 6 min Seated rows 35# 2x12 Lat pull down 25# 2x12 S2S OHP blue ball 2x10 Slant board calf stretch   02/20/23 NuStep L5 x6 min Seated rows 35# 2x12 Lat pull down 25# 2x12 blackTB ext 2x10 Pec stretch in doorway 10s x3 S2S holding blue ball 2x10 Horiz Abd blue 2x10 Calf stretch   02/15/23 NuStep L5 x71mins  blackTB ext 2x10 Seated rows 25# 2x10 Lat pull down 25# 2x10 Leg press 20# x10, 30# x10   02/13/23 Progress note NuStep L5 x66mins  UBE L2 x48mins each way  Pec stretch in doorway 30s x2 Scapular retraction with red band against wall 2x10 Horizontal abd with red band against wall 2x10  Shoulder ext 10#  2x10 Seated trunk rotation reach outs x5 each side  Prone on elbows x5, then hip ext 2x5 each side    02/08/23 NuStep L5 x8 mins  STS with shoulder flexion 2x10 2# WaTE  HS curls 35lb 2x10 Leg Ext 10lb 2x10 Seated Rows and Lats 25lb 2x10  blackTB ext 2x10 Horiz Abd green 2x10  02/06/23 NuStep L5 x 8 min S2S OHP blue ball 2x10 Slant board calf stretch 4x10'' Shoulder Ext 10lb 2x10 for core  HS curls 35lb 2x10 Leg Ext 10lb 2x10 Horiz Abd green 2x10  PATIENT EDUCATION:  Education details: POC and HEP Person educated: Patient and Child(ren) Education method: Explanation Education comprehension: verbalized understanding  HOME EXERCISE PROGRAM: Access Code: [email or web address] URL: [email or web address] Date: 12/04/2022 Prepared by: Cassie Freer  Exercises - Supine Lower Trunk Rotation  - 1 x daily - 7 x weekly - 2 sets - 10 reps - Supine Bridge  - 1 x daily - 7 x weekly - 2 sets - 10 reps - Supine Single Knee to Chest Stretch  - 2 x daily - 7 x weekly - 30 hold - Supine Active Straight Leg Raise  - 1 x daily - 7 x weekly - 2 sets - 10 reps - Seated Hamstring Stretch  - 2 x daily - 7 x weekly - 30 hold  ASSESSMENT:  CLINICAL IMPRESSION: Pt also reports that [facility] [facility] a [facility].  Continued with interventions that focused on postural correction. Pt has severe postural limitations that's could possible contribute to his pain. Overpressure given to W backs to increase ROM and stretch. Pt has good control with resisted side steps.   OBJECTIVE IMPAIRMENTS: Abnormal gait, difficulty walking, decreased strength, improper body mechanics, postural dysfunction, and pain.   ACTIVITY LIMITATIONS: bending, sitting, standing, stairs, and locomotion level   REHAB POTENTIAL: Good  CLINICAL DECISION MAKING: Stable/uncomplicated  EVALUATION COMPLEXITY: Low  GOALS: Goals reviewed with patient? Yes  SHORT TERM GOALS: Target date: 01/15/23  Patient will be independent with initial HEP.  Goal status: Met 12/21/22  2.  Patient will report centralization of radicular symptoms.  Baseline: radiating pain in to leg Goal status: ongoing 02/13/23  3.  Patient will demonstrate TUG < 15s  Baseline: 19.01  Goal status: 14.82 Met  01/09/23    LONG TERM GOALS: Target date: 03/26/23  Patient will be independent with advanced/ongoing HEP to improve outcomes and carryover.  Goal status: INITIAL  2.  Patient will report 75% improvement in low back pain to improve QOL.  Baseline: 7/10 Goal status: Progressing 02/01/23, 30% better 02/13/23, 60-80% Partly met 02/22/23  3.  Patient will demonstrate full pain free lumbar ROM to perform ADLs.   Goal status: On going 01/09/23, ongoing 02/13/23, Progressing 02/22/23  4.  Patient to demonstrate ability to achieve and maintain good spinal alignment/posturing and body mechanics needed for daily activities. Baseline: forward head, kyphosis, posterior pelvis tilt Goal status: on going 02/01/23, progressing 02/13/23  PLAN:  PT FREQUENCY: 2x/week  PT DURATION: 12 weeks  PLANNED INTERVENTIONS: Therapeutic exercises, Therapeutic activity, Neuromuscular re-education, Balance training, Gait training,  Patient/Family education, Self Care, Joint mobilization, Joint manipulation, Stair training, Dry Needling, Electrical stimulation, Spinal manipulation, Spinal mobilization, Cryotherapy, Moist heat, and Manual therapy.  PLAN FOR NEXT SESSION: postural exercises for upper back and pelvis, try more prone exercises  Cassie Freer, DPT 03/08/23 4:03 PM

## 2023-03-13 ENCOUNTER — Ambulatory Visit: Payer: Medicare Other | Admitting: Physical Therapy

## 2023-03-13 ENCOUNTER — Encounter: Payer: Self-pay | Admitting: Physical Therapy

## 2023-03-13 DIAGNOSIS — R293 Abnormal posture: Secondary | ICD-10-CM

## 2023-03-13 DIAGNOSIS — M5459 Other low back pain: Secondary | ICD-10-CM | POA: Diagnosis not present

## 2023-03-13 DIAGNOSIS — M6281 Muscle weakness (generalized): Secondary | ICD-10-CM

## 2023-03-13 DIAGNOSIS — M5442 Lumbago with sciatica, left side: Secondary | ICD-10-CM | POA: Diagnosis not present

## 2023-03-13 NOTE — Therapy (Signed)
OUTPATIENT PHYSICAL THERAPY THORACOLUMBAR TREATMENT    Patient Name: Cameron Todd MRN: [id number] DOB:09-12-1932, 89 y.o., male Today's Date: 03/13/2023  END OF SESSION:  PT End of Session - 03/13/23 1604     Visit Number 28    Date for PT Re-Evaluation 03/26/23    PT Start Time 1604    PT Stop Time 1645    PT Time Calculation (min) 41 min    Activity Tolerance Patient tolerated treatment well    Behavior During Therapy WFL for tasks assessed/performed                   Past Medical History:  Diagnosis Date   Arthritis    Barrett's esophagus    Compression fracture of spine (HCC)    T 10   Diabetes (HCC)    Elevated cholesterol    Gastric ulcer 1990   Hemorrhoids    History of colon polyps    Hypertension    Tubular adenoma of colon    Past Surgical History:  Procedure Laterality Date   cataract surgery bilateral     NO PAST SURGERIES     UPPER GASTROINTESTINAL ENDOSCOPY     Patient Active Problem List   Diagnosis Date Noted   Age related osteoporosis 11/09/2020   Cold intolerance 11/09/2020   Polyneuropathy 11/09/2020   Vitamin D deficiency 11/09/2020   Dorsalgia 11/09/2020   Controlled type 2 diabetes mellitus without complication, without long-term current use of insulin (HCC) 11/09/2020   Hypertensive heart and renal disease 11/09/2020   Benign prostatic hyperplasia with urinary hesitancy 11/09/2020   Barrett's esophagus 05/20/2013   HTN (hypertension) 05/20/2013   GENERALIZED OSTEOARTHROSIS UNSPECIFIED SITE 06/10/2008   EPIGASTRIC PAIN 06/10/2008   GASTRIC ULCER, HX OF 06/10/2008   INTERNAL HEMORRHOIDS 06/08/2008    PCP: Nadene Rubins  REFERRING PROVIDER: Coletta Memos  REFERRING DIAG:  M54.42 (ICD-10-CM) - Lumbago with sciatica, left side    Rationale for Evaluation and Treatment: Rehabilitation  THERAPY DIAG:  Abnormal posture  Left-sided low back pain with left-sided sciatica, unspecified chronicity  Other low back  pain  Muscle weakness (generalized)  ONSET DATE: 11/27/22  SUBJECTIVE:                                                                                                                                                                                           SUBJECTIVE STATEMENT: "Can't put weigh on it" L posterior hip pain when he puts weight on it .  PERTINENT HISTORY:  No surgical hx, arthritis   PAIN:  Are you having pain? Yes: NPRS scale: 6/10 Pain location: L hip down  my leg, low back Pain description: achy, sharp, radiating down leg, constant and never goes away, numbness Aggravating factors: standing, walking Relieving factors: just the medicine but only for a short time  PRECAUTIONS: None  WEIGHT BEARING RESTRICTIONS: No  FALLS:  Has patient fallen in last 6 months? No  LIVING ENVIRONMENT: Lives with: lives with their spouse Lives in: House/apartment Stairs: Yes: External: 3-4 steps; on left going up Has following equipment at home: None  OCCUPATION: Retired  PLOF: Independent and Independent with basic ADLs  PATIENT GOALS: some how get rid of the pain in my back and leg   NEXT MD VISIT: 12/11/22- spine doctor for injection  OBJECTIVE:   DIAGNOSTIC FINDINGS:  IMPRESSION: 1. Left lateral recess narrowing at L4-L5 and L5-S1, which may be a source of left L5 and/or S1 radiculopathy. 2. No central spinal canal or neural foraminal stenosis. 3. Chronic compression deformity of T11 with mild narrowing of the ventral thecal sac.   SCREENING FOR RED FLAGS: Bowel or bladder incontinence: No Spinal tumors: No Cauda equina syndrome: No Compression fracture: No Abdominal aneurysm: No  COGNITION: Overall cognitive status: Within functional limits for tasks assessed     SENSATION: WFL  MUSCLE LENGTH: Hamstrings: extremely tight in BLE  POSTURE: rounded shoulders, forward head, increased thoracic kyphosis, posterior pelvic tilt, and flexed trunk    PALPATION: TTP SIJ, L4-L5  LUMBAR ROM:   AROM eval 12/21/22 01/09/23 02/01/23 02/13/23 02/22/23  Flexion 75% pain in low back [facility] WFL  Extension 25% with pain Limited 50% with pain Limited 25% Limited 25% Only 25% movement Limited 25%  Right lateral flexion Fib head  [facility] WFL  Left lateral flexion Fib head, some pain WFL with pain [facility] WFL  Right rotation 25% with pain Limited 75% Limited 50% Limited 50% Limited 50% Limited 25%  Left rotation 25% with pain Limited 75% Limited 50% Limited 25% Limited 50% Limited 25%   (Blank rows = not tested)  LOWER EXTREMITY ROM:   grossly WFL    LOWER EXTREMITY MMT:  grossly 4-4+/5    LUMBAR SPECIAL TESTS:  Straight leg raise test: Positive and Slump test: Negative  FUNCTIONAL TESTS:  5 times sit to stand: 14.34s, 02/01/23 =11.33 sec Timed up and go (TUG): 19.01s, 02/01/23 = 14.28 sec  GAIT: Distance walked: in clinic distances Assistive device utilized: None Level of assistance: Modified independence Comments: flexed trunk, decreased foot clearance, forward head, kyphotic posture, increased posterior pelvic tilt, slowed speed   TODAY'S TREATMENT:                                                                                                                              DATE:  03/13/23 Bike L 2.5 x 7 min Seated Rows & Lats  25lb 2x12 S2S HOP with yellow ball 2x10 MHP to lumbar spine   PROM with end range holds to L hip  03/08/23 NuStep L 5 x 6 min  UBE L 2. 2 min each 20lb resisted side steps x5 each  S2S OHP yellow ball 2x10 Horizontal abd green 2x10 W backs against wall x10 STM to L glute area   03/06/23 Bike L3 x28mins STS with WaTE shoulder flexion OH 2x12 Resisted ER green 2x10 Horizontal abd green 2x10 Prone STM to L glute/hip  Prone hip extension 2x10 Prone cobra on elbows x10  Seated pball flexion and rotation x10    02/28/23 NuStep L 5 x6 min 20lb resisted side steps x5 each   Horizontal abd blue 2x10 S2S OHP yellow ball 2x10 W backs against wall x10  02/26/23 Bike L3 x2 mins NuStep L5 x66mins  Leg ext 10# 2x10  HS curls 35# 2x10 Seated rows 35# 3x10 Lat pull 25# 3x10  W backs against wall x10 Horizontal abd green 2x10  02/22/23 NuStep L 5 x 6 min Seated rows 35# 2x12 Lat pull down 25# 2x12 S2S OHP blue ball 2x10 Slant board calf stretch   02/20/23 NuStep L5 x6 min Seated rows 35# 2x12 Lat pull down 25# 2x12 blackTB ext 2x10 Pec stretch in doorway 10s x3 S2S holding blue ball 2x10 Horiz Abd blue 2x10 Calf stretch   02/15/23 NuStep L5 x90mins  blackTB ext 2x10 Seated rows 25# 2x10 Lat pull down 25# 2x10 Leg press 20# x10, 30# x10   02/13/23 Progress note NuStep L5 x47mins  UBE L2 x65mins each way  Pec stretch in doorway 30s x2 Scapular retraction with red band against wall 2x10 Horizontal abd with red band against wall 2x10  Shoulder ext 10#  2x10 Seated trunk rotation reach outs x5 each side  Prone on elbows x5, then hip ext 2x5 each side    02/08/23 NuStep L5 x8 mins  STS with shoulder flexion 2x10 2# WaTE  HS curls 35lb 2x10 Leg Ext 10lb 2x10 Seated Rows and Lats 25lb 2x10  blackTB ext 2x10 Horiz Abd green 2x10  02/06/23 NuStep L5 x 8 min S2S OHP blue ball 2x10 Slant board calf stretch 4x10'' Shoulder Ext 10lb 2x10 for core  HS curls 35lb 2x10 Leg Ext 10lb 2x10 Horiz Abd green 2x10  PATIENT EDUCATION:  Education details: POC and HEP Person educated: Patient and Child(ren) Education method: Explanation Education comprehension: verbalized understanding  HOME EXERCISE PROGRAM: Access Code: [email or web address] URL: [email or web address] Date: 12/04/2022 Prepared by: Cassie Freer  Exercises - Supine Lower Trunk Rotation  - 1 x daily - 7 x weekly - 2 sets - 10 reps - Supine Bridge  - 1 x daily - 7 x weekly - 2 sets - 10 reps - Supine Single Knee to Chest Stretch  - 2 x daily - 7 x weekly - 30 hold - Supine Active  Straight Leg Raise  - 1 x daily - 7 x weekly - 2 sets - 10 reps - Seated Hamstring Stretch  - 2 x daily - 7 x weekly - 30 hold  ASSESSMENT:  CLINICAL IMPRESSION: Pt enters with reports of increase posterior L hip pain. Pt stated that heat makes him feel better to it was added post session. Pt has severe postural limitations that could possibly contribute to his pain. Session backed down due to pt subjective complaints. L hip tightness with passive stretching.  OBJECTIVE IMPAIRMENTS: Abnormal gait, difficulty walking, decreased strength, improper body mechanics, postural dysfunction, and pain.   ACTIVITY LIMITATIONS: bending, sitting, standing, stairs, and locomotion level   REHAB POTENTIAL: Good  CLINICAL DECISION MAKING: Stable/uncomplicated  EVALUATION COMPLEXITY: Low  GOALS: Goals reviewed with patient? Yes  SHORT TERM GOALS: Target date: 01/15/23  Patient will be independent with initial HEP.  Goal status: Met 12/21/22  2.  Patient will report centralization of radicular symptoms.  Baseline: radiating pain in to leg Goal status: ongoing 02/13/23  3.  Patient will demonstrate TUG < 15s  Baseline: 19.01  Goal status: 14.82 Met  01/09/23    LONG TERM GOALS: Target date: 03/26/23  Patient will be independent with advanced/ongoing HEP to improve outcomes and carryover.  Goal status: INITIAL  2.  Patient will report 75% improvement in low back pain to improve QOL.  Baseline: 7/10 Goal status: Progressing 02/01/23, 30% better 02/13/23, 60-80% Partly met 02/22/23  3.  Patient will demonstrate full pain free lumbar ROM to perform ADLs.   Goal status: On going 01/09/23, ongoing 02/13/23, Progressing 02/22/23  4.  Patient to demonstrate ability to achieve and maintain good spinal alignment/posturing and body mechanics needed for daily activities. Baseline: forward head, kyphosis, posterior pelvis tilt Goal status: on going 02/01/23, progressing 02/13/23  PLAN:  PT FREQUENCY:  2x/week  PT DURATION: 12 weeks  PLANNED INTERVENTIONS: Therapeutic exercises, Therapeutic activity, Neuromuscular re-education, Balance training, Gait training, Patient/Family education, Self Care, Joint mobilization, Joint manipulation, Stair training, Dry Needling, Electrical stimulation, Spinal manipulation, Spinal mobilization, Cryotherapy, Moist heat, and Manual therapy.  PLAN FOR NEXT SESSION: postural exercises for upper back and pelvis, try more prone exercises  Cassie Freer, DPT 03/13/23 4:05 PM

## 2023-03-15 ENCOUNTER — Ambulatory Visit: Payer: Medicare Other | Admitting: Physical Therapy

## 2023-03-16 ENCOUNTER — Encounter: Payer: Self-pay | Admitting: Physical Therapy

## 2023-03-16 ENCOUNTER — Ambulatory Visit: Payer: Medicare Other | Admitting: Physical Therapy

## 2023-03-16 DIAGNOSIS — M5459 Other low back pain: Secondary | ICD-10-CM

## 2023-03-16 DIAGNOSIS — M6281 Muscle weakness (generalized): Secondary | ICD-10-CM | POA: Diagnosis not present

## 2023-03-16 DIAGNOSIS — R293 Abnormal posture: Secondary | ICD-10-CM

## 2023-03-16 DIAGNOSIS — M5442 Lumbago with sciatica, left side: Secondary | ICD-10-CM

## 2023-03-16 NOTE — Therapy (Signed)
OUTPATIENT PHYSICAL THERAPY THORACOLUMBAR TREATMENT    Patient Name: RAYFIELD LAFLEUR MRN: [id number] DOB:31-Aug-1932, 89 y.o., male Today's Date: 03/16/2023  END OF SESSION:  PT End of Session - 03/16/23 1018     Visit Number 29    Date for PT Re-Evaluation 03/26/23    PT Start Time 1018    PT Stop Time 1100    PT Time Calculation (min) 42 min    Activity Tolerance Patient tolerated treatment well    Behavior During Therapy WFL for tasks assessed/performed                   Past Medical History:  Diagnosis Date   Arthritis    Barrett's esophagus    Compression fracture of spine (HCC)    T 10   Diabetes (HCC)    Elevated cholesterol    Gastric ulcer 1990   Hemorrhoids    History of colon polyps    Hypertension    Tubular adenoma of colon    Past Surgical History:  Procedure Laterality Date   cataract surgery bilateral     NO PAST SURGERIES     UPPER GASTROINTESTINAL ENDOSCOPY     Patient Active Problem List   Diagnosis Date Noted   Age related osteoporosis 11/09/2020   Cold intolerance 11/09/2020   Polyneuropathy 11/09/2020   Vitamin D deficiency 11/09/2020   Dorsalgia 11/09/2020   Controlled type 2 diabetes mellitus without complication, without long-term current use of insulin (HCC) 11/09/2020   Hypertensive heart and renal disease 11/09/2020   Benign prostatic hyperplasia with urinary hesitancy 11/09/2020   Barrett's esophagus 05/20/2013   HTN (hypertension) 05/20/2013   GENERALIZED OSTEOARTHROSIS UNSPECIFIED SITE 06/10/2008   EPIGASTRIC PAIN 06/10/2008   GASTRIC ULCER, HX OF 06/10/2008   INTERNAL HEMORRHOIDS 06/08/2008    PCP: Nadene Rubins  REFERRING PROVIDER: Coletta Memos  REFERRING DIAG:  M54.42 (ICD-10-CM) - Lumbago with sciatica, left side    Rationale for Evaluation and Treatment: Rehabilitation  THERAPY DIAG:  Abnormal posture  Other low back pain  Muscle weakness (generalized)  Left-sided low back pain with left-sided  sciatica, unspecified chronicity  ONSET DATE: 11/27/22  SUBJECTIVE:                                                                                                                                                                                           SUBJECTIVE STATEMENT: A little but better, pain is still there, just not as intense  PERTINENT HISTORY:  No surgical hx, arthritis   PAIN:  Are you having pain? Yes: NPRS scale: 4/10 Pain location: L hip down my leg, low back  Pain description: achy, sharp, radiating down leg, constant and never goes away, numbness Aggravating factors: standing, walking Relieving factors: just the medicine but only for a short time  PRECAUTIONS: None  WEIGHT BEARING RESTRICTIONS: No  FALLS:  Has patient fallen in last 6 months? No  LIVING ENVIRONMENT: Lives with: lives with their spouse Lives in: House/apartment Stairs: Yes: External: 3-4 steps; on left going up Has following equipment at home: None  OCCUPATION: Retired  PLOF: Independent and Independent with basic ADLs  PATIENT GOALS: some how get rid of the pain in my back and leg   NEXT MD VISIT: 12/11/22- spine doctor for injection  OBJECTIVE:   DIAGNOSTIC FINDINGS:  IMPRESSION: 1. Left lateral recess narrowing at L4-L5 and L5-S1, which may be a source of left L5 and/or S1 radiculopathy. 2. No central spinal canal or neural foraminal stenosis. 3. Chronic compression deformity of T11 with mild narrowing of the ventral thecal sac.   SCREENING FOR RED FLAGS: Bowel or bladder incontinence: No Spinal tumors: No Cauda equina syndrome: No Compression fracture: No Abdominal aneurysm: No  COGNITION: Overall cognitive status: Within functional limits for tasks assessed     SENSATION: WFL  MUSCLE LENGTH: Hamstrings: extremely tight in BLE  POSTURE: rounded shoulders, forward head, increased thoracic kyphosis, posterior pelvic tilt, and flexed trunk   PALPATION: TTP SIJ,  L4-L5  LUMBAR ROM:   AROM eval 12/21/22 01/09/23 02/01/23 02/13/23 02/22/23  Flexion 75% pain in low back [facility] WFL  Extension 25% with pain Limited 50% with pain Limited 25% Limited 25% Only 25% movement Limited 25%  Right lateral flexion Fib head  [facility] WFL  Left lateral flexion Fib head, some pain WFL with pain [facility] WFL  Right rotation 25% with pain Limited 75% Limited 50% Limited 50% Limited 50% Limited 25%  NuStep L Left rotation 25% with pain Limited 75% Limited 50% Limited 25% Limited 50% Limited 25%   (Blank rows = not tested)  LOWER EXTREMITY ROM:   grossly WFL    LOWER EXTREMITY MMT:  grossly 4-4+/5    LUMBAR SPECIAL TESTS:  Straight leg raise test: Positive and Slump test: Negative  FUNCTIONAL TESTS:  5 times sit to stand: 14.34s, 02/01/23 =11.33 sec Timed up and go (TUG): 19.01s, 02/01/23 = 14.28 sec  GAIT: Distance walked: in clinic distances Assistive device utilized: None Level of assistance: Modified independence Comments: flexed trunk, decreased foot clearance, forward head, kyphotic posture, increased posterior pelvic tilt, slowed speed   TODAY'S TREATMENT:                                                                                                                              DATE:  03/16/23 NuStep L 5 x 7 min Leg ext 10# 2x10  HS curls 35# 2x10 4in box on airex step ups x10 each Seated rows 35lb 2x10 Lats 25lb 2x10 Seated lumbar Ext black band 2x10 Horizontal abd  green 2x10 Seated OHP yellow 2x10 Seated trunk rotations yellow ball   03/13/23 Bike L 2.5 x 7 min Seated Rows & Lats  25lb 2x12 S2S OHP with yellow ball 2x10 MHP to lumbar spine   PROM with end range holds to L hip     03/08/23 NuStep L 5 x 6 min  UBE L 2. 2 min each 20lb resisted side steps x5 each  S2S OHP yellow ball 2x10 Horizontal abd green 2x10 W backs against wall x10 STM to L glute area   03/06/23 Bike L3 x65mins STS with WaTE shoulder flexion OH  2x12 Resisted ER green 2x10 Horizontal abd green 2x10 Prone STM to L glute/hip  Prone hip extension 2x10 Prone cobra on elbows x10  Seated pball flexion and rotation x10    02/28/23 NuStep L 5 x6 min 20lb resisted side steps x5 each  Horizontal abd blue 2x10 S2S OHP yellow ball 2x10 W backs against wall x10  02/26/23 Bike L3 x2 mins NuStep L5 x58mins  Leg ext 10# 2x10  HS curls 35# 2x10 Seated rows 35# 3x10 Lat pull 25# 3x10  W backs against wall x10 Horizontal abd green 2x10  02/22/23 NuStep L 5 x 6 min Seated rows 35# 2x12 Lat pull down 25# 2x12 S2S OHP blue ball 2x10 Slant board calf stretch   02/20/23 NuStep L5 x6 min Seated rows 35# 2x12 Lat pull down 25# 2x12 blackTB ext 2x10 Pec stretch in doorway 10s x3 S2S holding blue ball 2x10 Horiz Abd blue 2x10 Calf stretch   02/15/23 NuStep L5 x110mins  blackTB ext 2x10 Seated rows 25# 2x10 Lat pull down 25# 2x10 Leg press 20# x10, 30# x10   02/13/23 Progress note NuStep L5 x59mins  UBE L2 x29mins each way  Pec stretch in doorway 30s x2 Scapular retraction with red band against wall 2x10 Horizontal abd with red band against wall 2x10  Shoulder ext 10#  2x10 Seated trunk rotation reach outs x5 each side  Prone on elbows x5, then hip ext 2x5 each side    02/08/23 NuStep L5 x8 mins  STS with shoulder flexion 2x10 2# WaTE  HS curls 35lb 2x10 Leg Ext 10lb 2x10 Seated Rows and Lats 25lb 2x10  blackTB ext 2x10 Horiz Abd green 2x10  02/06/23 NuStep L5 x 8 min S2S OHP blue ball 2x10 Slant board calf stretch 4x10'' Shoulder Ext 10lb 2x10 for core  HS curls 35lb 2x10 Leg Ext 10lb 2x10 Horiz Abd green 2x10  PATIENT EDUCATION:  Education details: POC and HEP Person educated: Patient and Child(ren) Education method: Explanation Education comprehension: verbalized understanding  HOME EXERCISE PROGRAM: Access Code: [email or web address] URL: [email or web address] Date: 12/04/2022 Prepared by: Cassie Freer  Exercises - Supine Lower Trunk Rotation  - 1 x daily - 7 x weekly - 2 sets - 10 reps - Supine Bridge  - 1 x daily - 7 x weekly - 2 sets - 10 reps - Supine Single Knee to Chest Stretch  - 2 x daily - 7 x weekly - 30 hold - Supine Active Straight Leg Raise  - 1 x daily - 7 x weekly - 2 sets - 10 reps - Seated Hamstring Stretch  - 2 x daily - 7 x weekly - 30 hold  ASSESSMENT:  CLINICAL IMPRESSION: Pt enters with reports of improvement. He stated pain is their but less intense. Pt has severe postural limitations that could possibly contribute to his pain. Pushed more functional interventions today. Weakness and instability present with  step ups utilizing ariex under 4in box. Postural cues given throughout session. No reports of increase pain during session.  OBJECTIVE IMPAIRMENTS: Abnormal gait, difficulty walking, decreased strength, improper body mechanics, postural dysfunction, and pain.   ACTIVITY LIMITATIONS: bending, sitting, standing, stairs, and locomotion level   REHAB POTENTIAL: Good  CLINICAL DECISION MAKING: Stable/uncomplicated  EVALUATION COMPLEXITY: Low  GOALS: Goals reviewed with patient? Yes  SHORT TERM GOALS: Target date: 01/15/23  Patient will be independent with initial HEP.  Goal status: Met 12/21/22  2.  Patient will report centralization of radicular symptoms.  Baseline: radiating pain in to leg Goal status: ongoing 02/13/23  3.  Patient will demonstrate TUG < 15s  Baseline: 19.01  Goal status: 14.82 Met  01/09/23    LONG TERM GOALS: Target date: 03/26/23  Patient will be independent with advanced/ongoing HEP to improve outcomes and carryover.  Goal status: INITIAL  2.  Patient will report 75% improvement in low back pain to improve QOL.  Baseline: 7/10 Goal status: Progressing 02/01/23, 30% better 02/13/23, 60-80% Partly met 02/22/23  3.  Patient will demonstrate full pain free lumbar ROM to perform ADLs.   Goal status: On going 01/09/23, ongoing  02/13/23, Progressing 02/22/23  4.  Patient to demonstrate ability to achieve and maintain good spinal alignment/posturing and body mechanics needed for daily activities. Baseline: forward head, kyphosis, posterior pelvis tilt Goal status: on going 02/01/23, progressing 02/13/23  PLAN:  PT FREQUENCY: 2x/week  PT DURATION: 12 weeks  PLANNED INTERVENTIONS: Therapeutic exercises, Therapeutic activity, Neuromuscular re-education, Balance training, Gait training, Patient/Family education, Self Care, Joint mobilization, Joint manipulation, Stair training, Dry Needling, Electrical stimulation, Spinal manipulation, Spinal mobilization, Cryotherapy, Moist heat, and Manual therapy.  PLAN FOR NEXT SESSION: postural exercises for upper back and pelvis, try more prone exercises  Cassie Freer, DPT 03/16/23 10:19 AM

## 2023-03-19 ENCOUNTER — Ambulatory Visit: Payer: Medicare Other

## 2023-03-22 ENCOUNTER — Ambulatory Visit: Payer: Medicare Other

## 2023-03-22 DIAGNOSIS — M5416 Radiculopathy, lumbar region: Secondary | ICD-10-CM | POA: Diagnosis not present

## 2023-03-22 DIAGNOSIS — M25552 Pain in left hip: Secondary | ICD-10-CM | POA: Diagnosis not present

## 2023-03-27 DIAGNOSIS — E119 Type 2 diabetes mellitus without complications: Secondary | ICD-10-CM | POA: Diagnosis not present

## 2023-03-27 DIAGNOSIS — K644 Residual hemorrhoidal skin tags: Secondary | ICD-10-CM | POA: Diagnosis not present

## 2023-03-27 DIAGNOSIS — R2681 Unsteadiness on feet: Secondary | ICD-10-CM | POA: Diagnosis not present

## 2023-03-27 DIAGNOSIS — M25552 Pain in left hip: Secondary | ICD-10-CM | POA: Diagnosis not present

## 2023-03-27 DIAGNOSIS — M5417 Radiculopathy, lumbosacral region: Secondary | ICD-10-CM | POA: Diagnosis not present

## 2023-03-27 DIAGNOSIS — M5416 Radiculopathy, lumbar region: Secondary | ICD-10-CM | POA: Diagnosis not present

## 2023-04-03 DIAGNOSIS — M791 Myalgia, unspecified site: Secondary | ICD-10-CM | POA: Diagnosis not present

## 2023-04-03 DIAGNOSIS — G8929 Other chronic pain: Secondary | ICD-10-CM | POA: Diagnosis not present

## 2023-04-03 DIAGNOSIS — J310 Chronic rhinitis: Secondary | ICD-10-CM | POA: Diagnosis not present

## 2023-04-03 DIAGNOSIS — K2270 Barrett's esophagus without dysplasia: Secondary | ICD-10-CM | POA: Diagnosis not present

## 2023-04-03 DIAGNOSIS — Z7984 Long term (current) use of oral hypoglycemic drugs: Secondary | ICD-10-CM | POA: Diagnosis not present

## 2023-04-03 DIAGNOSIS — G5700 Lesion of sciatic nerve, unspecified lower limb: Secondary | ICD-10-CM | POA: Diagnosis not present

## 2023-04-03 DIAGNOSIS — G4700 Insomnia, unspecified: Secondary | ICD-10-CM | POA: Diagnosis not present

## 2023-04-03 DIAGNOSIS — K921 Melena: Secondary | ICD-10-CM | POA: Diagnosis not present

## 2023-04-03 DIAGNOSIS — E785 Hyperlipidemia, unspecified: Secondary | ICD-10-CM | POA: Diagnosis not present

## 2023-04-03 DIAGNOSIS — M5416 Radiculopathy, lumbar region: Secondary | ICD-10-CM | POA: Diagnosis not present

## 2023-04-03 DIAGNOSIS — N39498 Other specified urinary incontinence: Secondary | ICD-10-CM | POA: Diagnosis not present

## 2023-04-03 DIAGNOSIS — K644 Residual hemorrhoidal skin tags: Secondary | ICD-10-CM | POA: Diagnosis not present

## 2023-04-03 DIAGNOSIS — M5417 Radiculopathy, lumbosacral region: Secondary | ICD-10-CM | POA: Diagnosis not present

## 2023-04-03 DIAGNOSIS — G2581 Restless legs syndrome: Secondary | ICD-10-CM | POA: Diagnosis not present

## 2023-04-03 DIAGNOSIS — Z8711 Personal history of peptic ulcer disease: Secondary | ICD-10-CM | POA: Diagnosis not present

## 2023-04-03 DIAGNOSIS — M8008XG Age-related osteoporosis with current pathological fracture, vertebra(e), subsequent encounter for fracture with delayed healing: Secondary | ICD-10-CM | POA: Diagnosis not present

## 2023-04-03 DIAGNOSIS — E1142 Type 2 diabetes mellitus with diabetic polyneuropathy: Secondary | ICD-10-CM | POA: Diagnosis not present

## 2023-04-03 DIAGNOSIS — N401 Enlarged prostate with lower urinary tract symptoms: Secondary | ICD-10-CM | POA: Diagnosis not present

## 2023-04-03 DIAGNOSIS — F419 Anxiety disorder, unspecified: Secondary | ICD-10-CM | POA: Diagnosis not present

## 2023-04-05 DIAGNOSIS — E1142 Type 2 diabetes mellitus with diabetic polyneuropathy: Secondary | ICD-10-CM | POA: Diagnosis not present

## 2023-04-05 DIAGNOSIS — G8929 Other chronic pain: Secondary | ICD-10-CM | POA: Diagnosis not present

## 2023-04-05 DIAGNOSIS — G5700 Lesion of sciatic nerve, unspecified lower limb: Secondary | ICD-10-CM | POA: Diagnosis not present

## 2023-04-05 DIAGNOSIS — M8008XG Age-related osteoporosis with current pathological fracture, vertebra(e), subsequent encounter for fracture with delayed healing: Secondary | ICD-10-CM | POA: Diagnosis not present

## 2023-04-05 DIAGNOSIS — M5416 Radiculopathy, lumbar region: Secondary | ICD-10-CM | POA: Diagnosis not present

## 2023-04-05 DIAGNOSIS — M5417 Radiculopathy, lumbosacral region: Secondary | ICD-10-CM | POA: Diagnosis not present

## 2023-04-10 DIAGNOSIS — M8008XG Age-related osteoporosis with current pathological fracture, vertebra(e), subsequent encounter for fracture with delayed healing: Secondary | ICD-10-CM | POA: Diagnosis not present

## 2023-04-10 DIAGNOSIS — M5417 Radiculopathy, lumbosacral region: Secondary | ICD-10-CM | POA: Diagnosis not present

## 2023-04-10 DIAGNOSIS — M5416 Radiculopathy, lumbar region: Secondary | ICD-10-CM | POA: Diagnosis not present

## 2023-04-10 DIAGNOSIS — G8929 Other chronic pain: Secondary | ICD-10-CM | POA: Diagnosis not present

## 2023-04-10 DIAGNOSIS — G5700 Lesion of sciatic nerve, unspecified lower limb: Secondary | ICD-10-CM | POA: Diagnosis not present

## 2023-04-10 DIAGNOSIS — E1142 Type 2 diabetes mellitus with diabetic polyneuropathy: Secondary | ICD-10-CM | POA: Diagnosis not present

## 2023-04-12 DIAGNOSIS — M5416 Radiculopathy, lumbar region: Secondary | ICD-10-CM | POA: Diagnosis not present

## 2023-04-12 DIAGNOSIS — G5700 Lesion of sciatic nerve, unspecified lower limb: Secondary | ICD-10-CM | POA: Diagnosis not present

## 2023-04-12 DIAGNOSIS — M8008XG Age-related osteoporosis with current pathological fracture, vertebra(e), subsequent encounter for fracture with delayed healing: Secondary | ICD-10-CM | POA: Diagnosis not present

## 2023-04-12 DIAGNOSIS — E1142 Type 2 diabetes mellitus with diabetic polyneuropathy: Secondary | ICD-10-CM | POA: Diagnosis not present

## 2023-04-12 DIAGNOSIS — M5417 Radiculopathy, lumbosacral region: Secondary | ICD-10-CM | POA: Diagnosis not present

## 2023-04-12 DIAGNOSIS — G8929 Other chronic pain: Secondary | ICD-10-CM | POA: Diagnosis not present

## 2023-04-16 DIAGNOSIS — M5416 Radiculopathy, lumbar region: Secondary | ICD-10-CM | POA: Diagnosis not present

## 2023-04-17 DIAGNOSIS — G5700 Lesion of sciatic nerve, unspecified lower limb: Secondary | ICD-10-CM | POA: Diagnosis not present

## 2023-04-17 DIAGNOSIS — E1142 Type 2 diabetes mellitus with diabetic polyneuropathy: Secondary | ICD-10-CM | POA: Diagnosis not present

## 2023-04-17 DIAGNOSIS — G8929 Other chronic pain: Secondary | ICD-10-CM | POA: Diagnosis not present

## 2023-04-17 DIAGNOSIS — M8008XG Age-related osteoporosis with current pathological fracture, vertebra(e), subsequent encounter for fracture with delayed healing: Secondary | ICD-10-CM | POA: Diagnosis not present

## 2023-04-17 DIAGNOSIS — M5416 Radiculopathy, lumbar region: Secondary | ICD-10-CM | POA: Diagnosis not present

## 2023-04-17 DIAGNOSIS — M5417 Radiculopathy, lumbosacral region: Secondary | ICD-10-CM | POA: Diagnosis not present

## 2023-04-19 DIAGNOSIS — G8929 Other chronic pain: Secondary | ICD-10-CM | POA: Diagnosis not present

## 2023-04-19 DIAGNOSIS — M8008XG Age-related osteoporosis with current pathological fracture, vertebra(e), subsequent encounter for fracture with delayed healing: Secondary | ICD-10-CM | POA: Diagnosis not present

## 2023-04-19 DIAGNOSIS — M5416 Radiculopathy, lumbar region: Secondary | ICD-10-CM | POA: Diagnosis not present

## 2023-04-19 DIAGNOSIS — G5700 Lesion of sciatic nerve, unspecified lower limb: Secondary | ICD-10-CM | POA: Diagnosis not present

## 2023-04-19 DIAGNOSIS — E1142 Type 2 diabetes mellitus with diabetic polyneuropathy: Secondary | ICD-10-CM | POA: Diagnosis not present

## 2023-04-19 DIAGNOSIS — M5417 Radiculopathy, lumbosacral region: Secondary | ICD-10-CM | POA: Diagnosis not present

## 2023-04-24 DIAGNOSIS — E1142 Type 2 diabetes mellitus with diabetic polyneuropathy: Secondary | ICD-10-CM | POA: Diagnosis not present

## 2023-04-24 DIAGNOSIS — M5417 Radiculopathy, lumbosacral region: Secondary | ICD-10-CM | POA: Diagnosis not present

## 2023-04-24 DIAGNOSIS — G8929 Other chronic pain: Secondary | ICD-10-CM | POA: Diagnosis not present

## 2023-04-24 DIAGNOSIS — M8008XG Age-related osteoporosis with current pathological fracture, vertebra(e), subsequent encounter for fracture with delayed healing: Secondary | ICD-10-CM | POA: Diagnosis not present

## 2023-04-24 DIAGNOSIS — G5700 Lesion of sciatic nerve, unspecified lower limb: Secondary | ICD-10-CM | POA: Diagnosis not present

## 2023-04-24 DIAGNOSIS — M5416 Radiculopathy, lumbar region: Secondary | ICD-10-CM | POA: Diagnosis not present

## 2023-04-26 DIAGNOSIS — E1142 Type 2 diabetes mellitus with diabetic polyneuropathy: Secondary | ICD-10-CM | POA: Diagnosis not present

## 2023-04-26 DIAGNOSIS — M5416 Radiculopathy, lumbar region: Secondary | ICD-10-CM | POA: Diagnosis not present

## 2023-04-26 DIAGNOSIS — M5417 Radiculopathy, lumbosacral region: Secondary | ICD-10-CM | POA: Diagnosis not present

## 2023-04-26 DIAGNOSIS — G5700 Lesion of sciatic nerve, unspecified lower limb: Secondary | ICD-10-CM | POA: Diagnosis not present

## 2023-04-26 DIAGNOSIS — G8929 Other chronic pain: Secondary | ICD-10-CM | POA: Diagnosis not present

## 2023-04-26 DIAGNOSIS — M8008XG Age-related osteoporosis with current pathological fracture, vertebra(e), subsequent encounter for fracture with delayed healing: Secondary | ICD-10-CM | POA: Diagnosis not present

## 2023-05-02 DIAGNOSIS — M5416 Radiculopathy, lumbar region: Secondary | ICD-10-CM | POA: Diagnosis not present

## 2023-05-02 DIAGNOSIS — E1142 Type 2 diabetes mellitus with diabetic polyneuropathy: Secondary | ICD-10-CM | POA: Diagnosis not present

## 2023-05-02 DIAGNOSIS — M5417 Radiculopathy, lumbosacral region: Secondary | ICD-10-CM | POA: Diagnosis not present

## 2023-05-02 DIAGNOSIS — M8008XG Age-related osteoporosis with current pathological fracture, vertebra(e), subsequent encounter for fracture with delayed healing: Secondary | ICD-10-CM | POA: Diagnosis not present

## 2023-05-02 DIAGNOSIS — G8929 Other chronic pain: Secondary | ICD-10-CM | POA: Diagnosis not present

## 2023-05-02 DIAGNOSIS — G5700 Lesion of sciatic nerve, unspecified lower limb: Secondary | ICD-10-CM | POA: Diagnosis not present

## 2023-05-03 DIAGNOSIS — F419 Anxiety disorder, unspecified: Secondary | ICD-10-CM | POA: Diagnosis not present

## 2023-05-03 DIAGNOSIS — J310 Chronic rhinitis: Secondary | ICD-10-CM | POA: Diagnosis not present

## 2023-05-03 DIAGNOSIS — E1142 Type 2 diabetes mellitus with diabetic polyneuropathy: Secondary | ICD-10-CM | POA: Diagnosis not present

## 2023-05-03 DIAGNOSIS — G4700 Insomnia, unspecified: Secondary | ICD-10-CM | POA: Diagnosis not present

## 2023-05-03 DIAGNOSIS — G5700 Lesion of sciatic nerve, unspecified lower limb: Secondary | ICD-10-CM | POA: Diagnosis not present

## 2023-05-03 DIAGNOSIS — G2581 Restless legs syndrome: Secondary | ICD-10-CM | POA: Diagnosis not present

## 2023-05-03 DIAGNOSIS — M5417 Radiculopathy, lumbosacral region: Secondary | ICD-10-CM | POA: Diagnosis not present

## 2023-05-03 DIAGNOSIS — N401 Enlarged prostate with lower urinary tract symptoms: Secondary | ICD-10-CM | POA: Diagnosis not present

## 2023-05-03 DIAGNOSIS — G8929 Other chronic pain: Secondary | ICD-10-CM | POA: Diagnosis not present

## 2023-05-03 DIAGNOSIS — M8008XG Age-related osteoporosis with current pathological fracture, vertebra(e), subsequent encounter for fracture with delayed healing: Secondary | ICD-10-CM | POA: Diagnosis not present

## 2023-05-03 DIAGNOSIS — M791 Myalgia, unspecified site: Secondary | ICD-10-CM | POA: Diagnosis not present

## 2023-05-03 DIAGNOSIS — K921 Melena: Secondary | ICD-10-CM | POA: Diagnosis not present

## 2023-05-03 DIAGNOSIS — K644 Residual hemorrhoidal skin tags: Secondary | ICD-10-CM | POA: Diagnosis not present

## 2023-05-03 DIAGNOSIS — N39498 Other specified urinary incontinence: Secondary | ICD-10-CM | POA: Diagnosis not present

## 2023-05-03 DIAGNOSIS — Z7984 Long term (current) use of oral hypoglycemic drugs: Secondary | ICD-10-CM | POA: Diagnosis not present

## 2023-05-03 DIAGNOSIS — Z8711 Personal history of peptic ulcer disease: Secondary | ICD-10-CM | POA: Diagnosis not present

## 2023-05-03 DIAGNOSIS — E785 Hyperlipidemia, unspecified: Secondary | ICD-10-CM | POA: Diagnosis not present

## 2023-05-03 DIAGNOSIS — K2270 Barrett's esophagus without dysplasia: Secondary | ICD-10-CM | POA: Diagnosis not present

## 2023-05-03 DIAGNOSIS — M5416 Radiculopathy, lumbar region: Secondary | ICD-10-CM | POA: Diagnosis not present

## 2023-05-08 DIAGNOSIS — M8008XG Age-related osteoporosis with current pathological fracture, vertebra(e), subsequent encounter for fracture with delayed healing: Secondary | ICD-10-CM | POA: Diagnosis not present

## 2023-05-08 DIAGNOSIS — M5417 Radiculopathy, lumbosacral region: Secondary | ICD-10-CM | POA: Diagnosis not present

## 2023-05-08 DIAGNOSIS — M5416 Radiculopathy, lumbar region: Secondary | ICD-10-CM | POA: Diagnosis not present

## 2023-05-08 DIAGNOSIS — E1142 Type 2 diabetes mellitus with diabetic polyneuropathy: Secondary | ICD-10-CM | POA: Diagnosis not present

## 2023-05-08 DIAGNOSIS — G5700 Lesion of sciatic nerve, unspecified lower limb: Secondary | ICD-10-CM | POA: Diagnosis not present

## 2023-05-08 DIAGNOSIS — G8929 Other chronic pain: Secondary | ICD-10-CM | POA: Diagnosis not present

## 2023-05-15 DIAGNOSIS — M5416 Radiculopathy, lumbar region: Secondary | ICD-10-CM | POA: Diagnosis not present

## 2023-05-15 DIAGNOSIS — G5700 Lesion of sciatic nerve, unspecified lower limb: Secondary | ICD-10-CM | POA: Diagnosis not present

## 2023-05-15 DIAGNOSIS — M8008XG Age-related osteoporosis with current pathological fracture, vertebra(e), subsequent encounter for fracture with delayed healing: Secondary | ICD-10-CM | POA: Diagnosis not present

## 2023-05-15 DIAGNOSIS — G8929 Other chronic pain: Secondary | ICD-10-CM | POA: Diagnosis not present

## 2023-05-15 DIAGNOSIS — E1142 Type 2 diabetes mellitus with diabetic polyneuropathy: Secondary | ICD-10-CM | POA: Diagnosis not present

## 2023-05-15 DIAGNOSIS — M5417 Radiculopathy, lumbosacral region: Secondary | ICD-10-CM | POA: Diagnosis not present

## 2023-05-22 DIAGNOSIS — G5700 Lesion of sciatic nerve, unspecified lower limb: Secondary | ICD-10-CM | POA: Diagnosis not present

## 2023-05-22 DIAGNOSIS — M5416 Radiculopathy, lumbar region: Secondary | ICD-10-CM | POA: Diagnosis not present

## 2023-05-22 DIAGNOSIS — E1142 Type 2 diabetes mellitus with diabetic polyneuropathy: Secondary | ICD-10-CM | POA: Diagnosis not present

## 2023-05-22 DIAGNOSIS — M5417 Radiculopathy, lumbosacral region: Secondary | ICD-10-CM | POA: Diagnosis not present

## 2023-05-22 DIAGNOSIS — G8929 Other chronic pain: Secondary | ICD-10-CM | POA: Diagnosis not present

## 2023-05-22 DIAGNOSIS — M8008XG Age-related osteoporosis with current pathological fracture, vertebra(e), subsequent encounter for fracture with delayed healing: Secondary | ICD-10-CM | POA: Diagnosis not present

## 2023-05-25 ENCOUNTER — Telehealth: Payer: Self-pay | Admitting: Pharmacy Technician

## 2023-05-25 NOTE — Telephone Encounter (Signed)
Auth Submission: NO AUTH NEEDED Site of care: Site of care: [facility] Payer: MEDICARE A/B & SUPP Medication & CPT/J Code(s) submitted: Prolia (Denosumab) [id number] Route of submission (phone, fax, portal):  Phone # Fax # Auth type: Buy/Bill PB Units/visits requested: X2 Reference number:  Approval from: 07/24/22 to 07/24/23

## 2023-05-30 DIAGNOSIS — M5416 Radiculopathy, lumbar region: Secondary | ICD-10-CM | POA: Diagnosis not present

## 2023-05-30 DIAGNOSIS — G5700 Lesion of sciatic nerve, unspecified lower limb: Secondary | ICD-10-CM | POA: Diagnosis not present

## 2023-05-30 DIAGNOSIS — M8008XG Age-related osteoporosis with current pathological fracture, vertebra(e), subsequent encounter for fracture with delayed healing: Secondary | ICD-10-CM | POA: Diagnosis not present

## 2023-05-30 DIAGNOSIS — M5417 Radiculopathy, lumbosacral region: Secondary | ICD-10-CM | POA: Diagnosis not present

## 2023-05-30 DIAGNOSIS — E1142 Type 2 diabetes mellitus with diabetic polyneuropathy: Secondary | ICD-10-CM | POA: Diagnosis not present

## 2023-05-30 DIAGNOSIS — G8929 Other chronic pain: Secondary | ICD-10-CM | POA: Diagnosis not present

## 2023-05-31 DIAGNOSIS — G8929 Other chronic pain: Secondary | ICD-10-CM | POA: Diagnosis not present

## 2023-05-31 DIAGNOSIS — E1142 Type 2 diabetes mellitus with diabetic polyneuropathy: Secondary | ICD-10-CM | POA: Diagnosis not present

## 2023-05-31 DIAGNOSIS — G2581 Restless legs syndrome: Secondary | ICD-10-CM | POA: Diagnosis not present

## 2023-05-31 DIAGNOSIS — K2270 Barrett's esophagus without dysplasia: Secondary | ICD-10-CM | POA: Diagnosis not present

## 2023-05-31 DIAGNOSIS — M5417 Radiculopathy, lumbosacral region: Secondary | ICD-10-CM | POA: Diagnosis not present

## 2023-05-31 DIAGNOSIS — J310 Chronic rhinitis: Secondary | ICD-10-CM | POA: Diagnosis not present

## 2023-05-31 DIAGNOSIS — M8008XG Age-related osteoporosis with current pathological fracture, vertebra(e), subsequent encounter for fracture with delayed healing: Secondary | ICD-10-CM | POA: Diagnosis not present

## 2023-05-31 DIAGNOSIS — G4700 Insomnia, unspecified: Secondary | ICD-10-CM | POA: Diagnosis not present

## 2023-05-31 DIAGNOSIS — G5700 Lesion of sciatic nerve, unspecified lower limb: Secondary | ICD-10-CM | POA: Diagnosis not present

## 2023-05-31 DIAGNOSIS — M791 Myalgia, unspecified site: Secondary | ICD-10-CM | POA: Diagnosis not present

## 2023-05-31 DIAGNOSIS — M5416 Radiculopathy, lumbar region: Secondary | ICD-10-CM | POA: Diagnosis not present

## 2023-05-31 DIAGNOSIS — E785 Hyperlipidemia, unspecified: Secondary | ICD-10-CM | POA: Diagnosis not present

## 2023-08-02 ENCOUNTER — Telehealth: Payer: Self-pay

## 2023-08-02 NOTE — Telephone Encounter (Signed)
 Auth Submission: NO AUTH NEEDED Site of care: Site of care: [facility] Payer: MEDICARE A/B & SUPP Medication & CPT/J Code(s) submitted: Prolia  (Denosumab ) [id number] Route of submission (phone, fax, portal):  Phone # Fax # Auth type: Buy/Bill PB Units/visits requested: X2 Reference number:  Approval from: 07/24/22 to 08/23/24

## 2023-08-29 ENCOUNTER — Ambulatory Visit: Payer: Medicare Other

## 2023-08-29 ENCOUNTER — Telehealth: Payer: Self-pay

## 2023-08-29 VITALS — BP 165/67 | HR 77 | Temp 97.9°F | Resp 18 | Ht 62.0 in | Wt 161.8 lb

## 2023-08-29 DIAGNOSIS — M810 Age-related osteoporosis without current pathological fracture: Secondary | ICD-10-CM | POA: Diagnosis not present

## 2023-08-29 MED ORDER — DENOSUMAB 60 MG/ML ~~LOC~~ SOSY
60.0000 mg | PREFILLED_SYRINGE | Freq: Once | SUBCUTANEOUS | Status: AC
  Administered 2023-08-29: 60 mg via SUBCUTANEOUS
  Filled 2023-08-29: qty 1

## 2023-08-29 NOTE — Telephone Encounter (Signed)
 Received a call from [place] with [facility] pt had calcium 10/01/2022 @9 .1. They gave ok to give injection of prolia .

## 2023-08-29 NOTE — Progress Notes (Signed)
 Diagnosis: Osteoporosis  Provider:  Chilton Greathouse MD  Procedure: Injection  Prolia (Denosumab), Dose: 60 mg, Site: subcutaneous, Number of injections: 1  Injection Site(s): Right arm  Post Care:  right arm injection  Discharge: Condition: Good, Destination: Home . AVS Provided  Performed by:  Rico Ala, LPN

## 2023-10-09 DIAGNOSIS — E119 Type 2 diabetes mellitus without complications: Secondary | ICD-10-CM | POA: Diagnosis not present

## 2023-10-09 DIAGNOSIS — E785 Hyperlipidemia, unspecified: Secondary | ICD-10-CM | POA: Diagnosis not present

## 2023-10-09 DIAGNOSIS — I10 Essential (primary) hypertension: Secondary | ICD-10-CM | POA: Diagnosis not present

## 2023-10-09 DIAGNOSIS — Z125 Encounter for screening for malignant neoplasm of prostate: Secondary | ICD-10-CM | POA: Diagnosis not present

## 2023-10-16 DIAGNOSIS — M810 Age-related osteoporosis without current pathological fracture: Secondary | ICD-10-CM | POA: Diagnosis not present

## 2023-10-16 DIAGNOSIS — M5417 Radiculopathy, lumbosacral region: Secondary | ICD-10-CM | POA: Diagnosis not present

## 2023-10-16 DIAGNOSIS — E119 Type 2 diabetes mellitus without complications: Secondary | ICD-10-CM | POA: Diagnosis not present

## 2023-10-16 DIAGNOSIS — R82998 Other abnormal findings in urine: Secondary | ICD-10-CM | POA: Diagnosis not present

## 2023-10-16 DIAGNOSIS — N1831 Chronic kidney disease, stage 3a: Secondary | ICD-10-CM | POA: Diagnosis not present

## 2023-10-16 DIAGNOSIS — R2681 Unsteadiness on feet: Secondary | ICD-10-CM | POA: Diagnosis not present

## 2023-10-16 DIAGNOSIS — R3911 Hesitancy of micturition: Secondary | ICD-10-CM | POA: Diagnosis not present

## 2023-10-16 DIAGNOSIS — Z0000 Encounter for general adult medical examination without abnormal findings: Secondary | ICD-10-CM | POA: Diagnosis not present

## 2023-10-16 DIAGNOSIS — I10 Essential (primary) hypertension: Secondary | ICD-10-CM | POA: Diagnosis not present

## 2023-10-30 DIAGNOSIS — R058 Other specified cough: Secondary | ICD-10-CM | POA: Diagnosis not present

## 2023-10-30 DIAGNOSIS — J209 Acute bronchitis, unspecified: Secondary | ICD-10-CM | POA: Diagnosis not present

## 2023-10-30 DIAGNOSIS — R5383 Other fatigue: Secondary | ICD-10-CM | POA: Diagnosis not present

## 2023-10-30 DIAGNOSIS — J310 Chronic rhinitis: Secondary | ICD-10-CM | POA: Diagnosis not present

## 2023-10-30 DIAGNOSIS — Z1152 Encounter for screening for COVID-19: Secondary | ICD-10-CM | POA: Diagnosis not present

## 2023-10-30 DIAGNOSIS — R0981 Nasal congestion: Secondary | ICD-10-CM | POA: Diagnosis not present

## 2023-11-01 ENCOUNTER — Encounter: Payer: Self-pay | Admitting: Internal Medicine

## 2023-11-16 NOTE — Progress Notes (Deleted)
 [name]  Patient Care Team: Bertha Broad, MD as PCP - General (Internal Medicine)   ASSESSMENT & PLAN 89 y.o. male with history of hypertension, CKD 3a, osteoporosis with compression fracture of T10 on prolia  and diabetes being seen for lymphocytosis.  Outside records showed mild absolute lymphocytosis 8.6. WBC of 12.7.  The etiology is not clear at this time and requires further testing and investigation.  Assessment & Plan   No orders of the defined types were placed in this encounter.    WBC count in 2.5 % of the normal population will be greater than two standard deviations above the mean (ie, >11,000/microL).  Check CRP for inflammatory disease Blood Smear for morphology   All questions were answered. The patient knows to call the clinic with any problems, questions or concerns. I spent {CHL ONC TIME VISIT - BMWUX:[id number]} counseling the patient face to face, counseling and review of plan of care.   Lowanda Ruddy, MD 11/16/2023 10:33 AM   CHIEF COMPLAINTS/PURPOSE OF CONSULTATION:  lymphocytosis  HISTORY OF PRESENTING ILLNESS:  Cameron Todd 89 y.o. male is here because of elevated WBC of lymphocytosis.  Outside records:  10/09/23. WBC 12.7. HGB 11.9 MCV 98.9. Platelet 160. ANC 3.5. ALC 8.6 (67%) Cr 1.4 BUN 31. calcium 9.1 albumin 4.6. total protein 7.1  PSA 0.7  ***He was found to have abnormal CBC from *** ***He denies recent infection. The last prescription antibiotics was more than 3 months ago There is not reported symptoms of sinus congestion, cough, urinary frequency/urgency or dysuria, diarrhea, joint swelling/pain or abnormal skin rash.  ***He had no prior history or diagnosis of cancer. His age appropriate screening programs are up-to-date. The patient has no prior diagnosis of autoimmune disease and was not prescribed corticosteroids related products. *** The patient is a smoker and currently smokes *** pack of  cigarettes per day for the last *** years.  Infectious mononucleosis {Yes/No:30480221} Other viral illnesses that can be associated with lymphocytosis include: {Yes/No:30480221} HIV HTLV-I (human T-lymphotropic virus type I) CMV Mumps Rubella Measles Influenza Hepatitis Adenovirus Coxsackie virus Poliovirus  Pertussis {Yes/No:30480221} Cat scratch disease {Yes/No:30480221} Night sweat, weight loss, decrease appetite {Yes/No:30480221} Severe medical stress, including trauma, cardiac emergencies, or status epilepticus {Yes/No:30480221} Persistent polyclonal B cell lymphocytosis in young to middle-aged women who smoke cigarettes  {Yes/No:30480221} History of splenectomy {Yes/No:30480221} Thymoma {Yes/No:30480221}    MEDICAL HISTORY:  Past Medical History:  Diagnosis Date   Arthritis    Barrett's esophagus    Compression fracture of spine (HCC)    T 10   Diabetes (HCC)    Elevated cholesterol    Gastric ulcer 1990   Hemorrhoids    History of colon polyps    Hypertension    Tubular adenoma of colon     SURGICAL HISTORY: Past Surgical History:  Procedure Laterality Date   cataract surgery bilateral     NO PAST SURGERIES     UPPER GASTROINTESTINAL ENDOSCOPY      SOCIAL HISTORY: Social History   Socioeconomic History   Marital status: Married    Spouse name: Not on file   Number of children: Not on file   Years of education: Not on file   Highest education level: Not on file  Occupational History   Not on file  Tobacco Use   Smoking status: Never   Smokeless tobacco: Never  Vaping Use   Vaping status: Never Used  Substance and Sexual Activity   Alcohol use:  No   Drug use: No   Sexual activity: Not on file  Other Topics Concern   Not on file  Social History Narrative   Not on file   Social Drivers of Health   Financial Resource Strain: Not on file  Food Insecurity: Not on file  Transportation Needs: Not on file  Physical Activity: Not on file   Stress: Not on file  Social Connections: Not on file  Intimate Partner Violence: Not on file    FAMILY HISTORY: Family History  Problem Relation Age of Onset   Diabetes Sister    Colon cancer Neg Hx     ALLERGIES:  has no known allergies.  MEDICATIONS:  Current Outpatient Medications  Medication Sig Dispense Refill   acetaminophen-codeine (TYLENOL #4) 300-60 MG per tablet Take 1-2 tablets by mouth daily.      CALCIUM CITRATE PO Take 500 mg by mouth 2 (two) times daily.     CRESTOR 10 MG tablet Take 10 mg by mouth daily.      denosumab  (PROLIA ) 60 MG/ML SOLN injection Inject 60 mg into the skin every 6 (six) months. Administer in upper arm, thigh, or abdomen     gabapentin (NEURONTIN) 300 MG capsule Take 300 mg by mouth 2 (two) times daily.      losartan (COZAAR) 25 MG tablet Take 25 mg by mouth daily.     magnesium oxide (MAG-OX) 400 MG tablet Take 400 mg by mouth daily.     methocarbamol (ROBAXIN) 500 MG tablet Take 500 mg by mouth 2 (two) times daily.      metoprolol succinate (TOPROL-XL) 25 MG 24 hr tablet Take 25 mg by mouth daily.     mirabegron ER (MYRBETRIQ) 50 MG TB24 tablet Take 50 mg by mouth daily.     Multiple Vitamin (MULTIVITAMIN) tablet Take 1 tablet by mouth daily.     omeprazole  (PRILOSEC) 40 MG capsule Take 1 capsule (40 mg total) by mouth 2 (two) times daily. 180 capsule 1   pioglitazone (ACTOS) 30 MG tablet Take 30 mg by mouth daily.      tamsulosin (FLOMAX) 0.4 MG CAPS capsule Take 0.4 mg by mouth.     Vitamin D, Ergocalciferol, (DRISDOL) 50000 UNITS CAPS capsule Take 50,000 Units by mouth every 7 (seven) days.     No current facility-administered medications for this visit.    REVIEW OF SYSTEMS:   All relevant systems were reviewed with the patient and are negative.  PHYSICAL EXAMINATION: ECOG PERFORMANCE STATUS: {CHL ONC ECOG PS:[phone]}  There were no vitals filed for this visit. There were no vitals filed for this visit.  GENERAL: alert, no  distress and comfortable SKIN: skin color normal, no rashes or significant lesions EYES: normal, sclera clear OROPHARYNX: no exudate, no erythema  NECK: supple, non-tender, without nodularity LYMPH:  no palpable cervical, axillary lymphadenopathy LUNGS: clear to auscultation and no wheezes, rales and with normal breathing effort HEART: regular rate & rhythm and no murmurs ABDOMEN: abdomen soft, non-tender and nondistended Musculoskeletal: and no lower extremity edema NEURO: no focal motor/sensory deficits  LABORATORY DATA:  I have reviewed the data as listed No results found for this or any previous visit (from the past 2160 hours).  RADIOGRAPHIC STUDIES: I have personally reviewed the radiological images as listed and agreed with the findings in the report. No results found.

## 2023-11-19 ENCOUNTER — Inpatient Hospital Stay

## 2023-11-22 ENCOUNTER — Inpatient Hospital Stay

## 2023-11-22 VITALS — BP 134/51 | HR 79 | Temp 97.5°F | Resp 17 | Ht 62.0 in | Wt 156.2 lb

## 2023-11-22 DIAGNOSIS — C9110 Chronic lymphocytic leukemia of B-cell type not having achieved remission: Secondary | ICD-10-CM | POA: Diagnosis not present

## 2023-11-22 DIAGNOSIS — Z79899 Other long term (current) drug therapy: Secondary | ICD-10-CM | POA: Insufficient documentation

## 2023-11-22 DIAGNOSIS — Z8719 Personal history of other diseases of the digestive system: Secondary | ICD-10-CM | POA: Insufficient documentation

## 2023-11-22 DIAGNOSIS — M810 Age-related osteoporosis without current pathological fracture: Secondary | ICD-10-CM | POA: Insufficient documentation

## 2023-11-22 DIAGNOSIS — E119 Type 2 diabetes mellitus without complications: Secondary | ICD-10-CM | POA: Diagnosis not present

## 2023-11-22 DIAGNOSIS — D649 Anemia, unspecified: Secondary | ICD-10-CM

## 2023-11-22 DIAGNOSIS — Z860101 Personal history of adenomatous and serrated colon polyps: Secondary | ICD-10-CM | POA: Diagnosis not present

## 2023-11-22 DIAGNOSIS — E7800 Pure hypercholesterolemia, unspecified: Secondary | ICD-10-CM | POA: Insufficient documentation

## 2023-11-22 DIAGNOSIS — E559 Vitamin D deficiency, unspecified: Secondary | ICD-10-CM | POA: Diagnosis not present

## 2023-11-22 DIAGNOSIS — Z833 Family history of diabetes mellitus: Secondary | ICD-10-CM | POA: Insufficient documentation

## 2023-11-22 DIAGNOSIS — I10 Essential (primary) hypertension: Secondary | ICD-10-CM | POA: Insufficient documentation

## 2023-11-22 DIAGNOSIS — D72820 Lymphocytosis (symptomatic): Secondary | ICD-10-CM | POA: Insufficient documentation

## 2023-11-22 DIAGNOSIS — R5383 Other fatigue: Secondary | ICD-10-CM | POA: Insufficient documentation

## 2023-11-22 DIAGNOSIS — M545 Low back pain, unspecified: Secondary | ICD-10-CM | POA: Diagnosis not present

## 2023-11-22 LAB — CBC WITH DIFFERENTIAL (CANCER CENTER ONLY)
Abs Immature Granulocytes: 0.01 10*3/uL (ref 0.00–0.07)
Basophils Absolute: 0.1 10*3/uL (ref 0.0–0.1)
Basophils Relative: 1 %
Eosinophils Absolute: 0.2 10*3/uL (ref 0.0–0.5)
Eosinophils Relative: 2 %
HCT: 35 % — ABNORMAL LOW (ref 39.0–52.0)
Hemoglobin: 11.6 g/dL — ABNORMAL LOW (ref 13.0–17.0)
Immature Granulocytes: 0 %
Lymphocytes Relative: 63 %
Lymphs Abs: 7.2 10*3/uL — ABNORMAL HIGH (ref 0.7–4.0)
MCH: 31.5 pg (ref 26.0–34.0)
MCHC: 33.1 g/dL (ref 30.0–36.0)
MCV: 95.1 fL (ref 80.0–100.0)
Monocytes Absolute: 1.3 10*3/uL — ABNORMAL HIGH (ref 0.1–1.0)
Monocytes Relative: 12 %
Neutro Abs: 2.5 10*3/uL (ref 1.7–7.7)
Neutrophils Relative %: 22 %
Platelet Count: 145 10*3/uL — ABNORMAL LOW (ref 150–400)
RBC: 3.68 MIL/uL — ABNORMAL LOW (ref 4.22–5.81)
RDW: 13.2 % (ref 11.5–15.5)
WBC Count: 11.2 10*3/uL — ABNORMAL HIGH (ref 4.0–10.5)
nRBC: 0 % (ref 0.0–0.2)

## 2023-11-22 LAB — CMP (CANCER CENTER ONLY)
ALT: 19 U/L (ref 0–44)
AST: 22 U/L (ref 15–41)
Albumin: 4.5 g/dL (ref 3.5–5.0)
Alkaline Phosphatase: 46 U/L (ref 38–126)
Anion gap: 5 (ref 5–15)
BUN: 24 mg/dL — ABNORMAL HIGH (ref 8–23)
CO2: 29 mmol/L (ref 22–32)
Calcium: 9.6 mg/dL (ref 8.9–10.3)
Chloride: 102 mmol/L (ref 98–111)
Creatinine: 1.53 mg/dL — ABNORMAL HIGH (ref 0.61–1.24)
GFR, Estimated: 43 mL/min — ABNORMAL LOW (ref >60)
Glucose, Bld: 150 mg/dL — ABNORMAL HIGH (ref 70–99)
Potassium: 4.4 mmol/L (ref 3.5–5.1)
Sodium: 136 mmol/L (ref 135–145)
Total Bilirubin: 0.5 mg/dL (ref 0.0–1.2)
Total Protein: 7.8 g/dL (ref 6.5–8.1)

## 2023-11-22 LAB — LACTATE DEHYDROGENASE: LDH: 212 U/L — ABNORMAL HIGH (ref 98–192)

## 2023-11-22 LAB — FERRITIN: Ferritin: 382 ng/mL — ABNORMAL HIGH (ref 24–336)

## 2023-11-22 LAB — VITAMIN B12: Vitamin B-12: 729 pg/mL (ref 180–914)

## 2023-11-22 LAB — FOLATE: Folate: 35.3 ng/mL (ref >5.9)

## 2023-11-22 NOTE — Progress Notes (Signed)
 [facility] CONSULT NOTE  Patient Care Team: Bertha Broad, MD as PCP - General (Internal Medicine)   ASSESSMENT & PLAN 89 y.o. male with history of HTN, DM2, vit D deficiency, osteoporosis being seen for lymphocytosis.  The etiology is not clear at this time and requires further testing and investigation.  We discussed the possibility of CLL and explained what this means. Report his WBC was lower in the year prior and now at higher lever. Discussed level is still consider low and if confirmed diagnosis, he will need long term monitoring. He will return to discuss results in a few week once diagnosis is confirmed.  Assessment & Plan Lymphocytosis CBC, CMP, LDH, FC and CG for CLL Normocytic anemia B12, folate, ferritin  Orders Placed This Encounter  Procedures   CBC with Differential (Cancer Center Only)    Standing Status:   Future    Number of Occurrences:   1    Expiration Date:   11/21/2024   CMP (Cancer Center only)    Standing Status:   Future    Number of Occurrences:   1    Expiration Date:   11/21/2024   Lactate dehydrogenase    Standing Status:   Future    Number of Occurrences:   1    Expiration Date:   11/21/2024   Vitamin B12    Standing Status:   Future    Number of Occurrences:   1    Expiration Date:   11/21/2024   Folate    Standing Status:   Future    Number of Occurrences:   1    Expiration Date:   11/21/2024   Ferritin    Standing Status:   Future    Number of Occurrences:   1    Expiration Date:   11/21/2024   FISH CLL Leukemia    Standing Status:   Future    Number of Occurrences:   1    Expiration Date:   11/21/2024   Flow Cytometry, Peripheral Blood (Oncology)    Standing Status:   Future    Number of Occurrences:   1    Expiration Date:   11/21/2024   All questions were answered. The patient knows to call the clinic with any problems, questions or concerns.   Lowanda Ruddy, MD 11/22/2023 1:47 PM   CHIEF COMPLAINTS/PURPOSE OF  CONSULTATION:  lymphocytosis  HISTORY OF PRESENTING ILLNESS:  KEVONTA SPREHE 89 y.o. male is here because of elevated WBC.  From outside records: 10/09/23 WBC  12.7. Abs Lymph 8.6. ANC 3.5. hgb 11.9 MCV 99. Platelet 160 Cr 1.4. BUN 31. T. Bili 0.6. Ca 9.1. Alk phos 41.  From 2022. [place] showed normal WBC. 6.6.  He denies recent infection. The last prescription antibiotics was more than 3 months ago There is not reported symptoms of sinus congestion, cough, urinary frequency/urgency or dysuria, diarrhea, joint swelling/pain or abnormal skin rash.   No infection prior to lab testing in March. He is recovered from bronchitis in April.  He has no night sweats, weight loss or decreased appetite, mass or lymph node swelling, stomach pain.   MEDICAL HISTORY:  Past Medical History:  Diagnosis Date   Arthritis    Barrett's esophagus    Compression fracture of spine (HCC)    T 10   Diabetes (HCC)    Elevated cholesterol    Gastric ulcer 1990   Hemorrhoids    History of colon polyps  Hypertension    Tubular adenoma of colon     SURGICAL HISTORY: Past Surgical History:  Procedure Laterality Date   cataract surgery bilateral     NO PAST SURGERIES     UPPER GASTROINTESTINAL ENDOSCOPY      SOCIAL HISTORY: Social History   Socioeconomic History   Marital status: Married    Spouse name: Not on file   Number of children: Not on file   Years of education: Not on file   Highest education level: Not on file  Occupational History   Not on file  Tobacco Use   Smoking status: Never   Smokeless tobacco: Never  Vaping Use   Vaping status: Never Used  Substance and Sexual Activity   Alcohol use: No   Drug use: No   Sexual activity: Not on file  Other Topics Concern   Not on file  Social History Narrative   Not on file   Social Drivers of Health   Financial Resource Strain: Not on file  Food Insecurity: Not on file  Transportation Needs: Not on file  Physical  Activity: Not on file  Stress: Not on file  Social Connections: Not on file  Intimate Partner Violence: Not on file    FAMILY HISTORY: Family History  Problem Relation Age of Onset   Diabetes Sister    Colon cancer Neg Hx     ALLERGIES:  has no known allergies.  MEDICATIONS:  Current Outpatient Medications  Medication Sig Dispense Refill   acetaminophen-codeine (TYLENOL #4) 300-60 MG per tablet Take 1-2 tablets by mouth daily.      CALCIUM CITRATE PO Take 500 mg by mouth 2 (two) times daily.     CRESTOR 10 MG tablet Take 10 mg by mouth daily.      denosumab  (PROLIA ) 60 MG/ML SOLN injection Inject 60 mg into the skin every 6 (six) months. Administer in upper arm, thigh, or abdomen     losartan (COZAAR) 25 MG tablet Take 25 mg by mouth daily.     magnesium oxide (MAG-OX) 400 MG tablet Take 400 mg by mouth daily.     methocarbamol (ROBAXIN) 500 MG tablet Take 500 mg by mouth 2 (two) times daily.      metoprolol succinate (TOPROL-XL) 25 MG 24 hr tablet Take 25 mg by mouth daily.     Multiple Vitamin (MULTIVITAMIN) tablet Take 1 tablet by mouth daily.     omeprazole  (PRILOSEC) 40 MG capsule Take 1 capsule (40 mg total) by mouth 2 (two) times daily. 180 capsule 1   pioglitazone (ACTOS) 30 MG tablet Take 30 mg by mouth daily.      tamsulosin (FLOMAX) 0.4 MG CAPS capsule Take 0.4 mg by mouth.     Vitamin D, Ergocalciferol, (DRISDOL) 50000 UNITS CAPS capsule Take 50,000 Units by mouth every 7 (seven) days.     No current facility-administered medications for this visit.    REVIEW OF SYSTEMS:   All relevant systems were reviewed with the patient and are negative.  PHYSICAL EXAMINATION: ECOG PERFORMANCE STATUS: 2 - Symptomatic, <50% confined to bed  Vitals:   11/22/23 1218  BP: (!) 134/51  Pulse: 79  Resp: 17  Temp: (!) 97.5 F (36.4 C)  SpO2: 99%   Filed Weights   11/22/23 1218  Weight: 156 lb 3.2 oz (70.9 kg)    GENERAL: alert, no distress and comfortable SKIN: skin  color normal NECK: no enlarged mass LYMPH:  small palpable cervical nodes. No axillary lymphadenopathy LUNGS: clear  to auscultation and no wheezes, rales and with normal breathing effort HEART: regular rate & rhythm and no murmurs ABDOMEN: abdomen soft, non-tender and nondistended Musculoskeletal: and no lower extremity edema   LABORATORY DATA:  I have reviewed the data as listed New labs ordered.

## 2023-11-28 LAB — SURGICAL PATHOLOGY

## 2023-11-29 LAB — FISH HES LEUKEMIA, 4Q12 REA

## 2023-11-29 LAB — FLOW CYTOMETRY

## 2023-12-13 DIAGNOSIS — C9110 Chronic lymphocytic leukemia of B-cell type not having achieved remission: Secondary | ICD-10-CM | POA: Insufficient documentation

## 2023-12-13 NOTE — Progress Notes (Signed)
 [facility] OFFICE PROGRESS NOTE  Patient Care Team: Bertha Broad, MD as PCP - General (Internal Medicine)  89 y.o. male with history of HTN, DM2, vit D deficiency, osteoporosis being seen for lymphocytosis.   Testing confired CLL with del13q and loss of TP53. Discussed results today. Discussed monitoring for indication for treatment. Currently no indication for treatment. Discussed monitor concerning symptoms like night sweats, unexpected weight loss, decrease appetite. Report new symptoms between visits for earlier evaluation. Will obtain baseline PET as he has generalized fatigue and gets tired easily to make sure no occult lymphadenopathy as the underlying etiology. If no concerning findings, will follow up in about 3 months with repeat labs.  Assessment & Plan CLL (chronic lymphocytic leukemia) (HCC) Will obtain CT for evaluation Repeat lab in 3 months.  Orders Placed This Encounter  Procedures   NM PET Image Initial (PI) Skull Base To Thigh    Standing Status:   Future    Expected Date:   12/28/2023    Expiration Date:   12/13/2024    If indicated for the ordered procedure, I authorize the administration of a radiopharmaceutical per Radiology protocol:   Yes    Preferred imaging location?:   [place]   CBC with Differential (Cancer Center Only)    Standing Status:   Future    Expiration Date:   12/13/2024   CMP (Cancer Center only)    Standing Status:   Future    Expiration Date:   12/13/2024   Lactate dehydrogenase    Standing Status:   Future    Expiration Date:   12/13/2024     Lowanda Ruddy, MD  INTERVAL HISTORY: Patient returns for follow-up. Some fatigue and generalized aching. Some pain in the lower back radiating down the left side. No mass, LAD, GI symptoms.  Oncology History  CLL (chronic lymphocytic leukemia) (HCC)  11/22/2023 Initial Diagnosis   CLL (chronic lymphocytic leukemia) (HCC) WBC 11.2 Hgb 11.6. MCV 95. Plt 145. ALC 7.2    Peripheral blood, flow cytometry:  -  Monoclonal B-cell lymphocytosis (high count) with a CLL immunophenotype  FISH:  positive for loss of both 13q14 signals and loss of one TP53 signal, consistent with a deletion of 13q14 region and a deletion of TP53 gene.     Past Medical History:  Diagnosis Date   Arthritis    Barrett's esophagus    Compression fracture of spine (HCC)    T 10   Diabetes (HCC)    Elevated cholesterol    Gastric ulcer 1990   Hemorrhoids    History of colon polyps    Hypertension    Tubular adenoma of colon      PHYSICAL EXAMINATION: ECOG PERFORMANCE STATUS: 2 - Symptomatic, <50% confined to bed  VSS  GENERAL: alert, no distress and comfortable SKIN: skin color normal NECK: No palpable mass LYMPH:  no palpable cervical, axillary lymphadenopathy  LUNGS: clear to auscultation and percussion with normal breathing effort HEART: regular rate & rhythm  ABDOMEN: abdomen soft, non-tender and nondistended.   Relevant data reviewed during this visit included labs

## 2023-12-13 NOTE — Assessment & Plan Note (Signed)
 Will obtain CT for evaluation Repeat lab in 3 months.

## 2023-12-14 ENCOUNTER — Inpatient Hospital Stay (HOSPITAL_BASED_OUTPATIENT_CLINIC_OR_DEPARTMENT_OTHER)

## 2023-12-14 DIAGNOSIS — D649 Anemia, unspecified: Secondary | ICD-10-CM | POA: Diagnosis not present

## 2023-12-14 DIAGNOSIS — C9110 Chronic lymphocytic leukemia of B-cell type not having achieved remission: Secondary | ICD-10-CM | POA: Diagnosis not present

## 2023-12-14 DIAGNOSIS — M810 Age-related osteoporosis without current pathological fracture: Secondary | ICD-10-CM | POA: Diagnosis not present

## 2023-12-14 DIAGNOSIS — D72820 Lymphocytosis (symptomatic): Secondary | ICD-10-CM | POA: Diagnosis not present

## 2023-12-14 DIAGNOSIS — E119 Type 2 diabetes mellitus without complications: Secondary | ICD-10-CM | POA: Diagnosis not present

## 2023-12-14 DIAGNOSIS — I10 Essential (primary) hypertension: Secondary | ICD-10-CM | POA: Diagnosis not present

## 2023-12-14 DIAGNOSIS — E559 Vitamin D deficiency, unspecified: Secondary | ICD-10-CM | POA: Diagnosis not present

## 2023-12-31 ENCOUNTER — Ambulatory Visit (HOSPITAL_COMMUNITY)
Admission: RE | Admit: 2023-12-31 | Discharge: 2023-12-31 | Disposition: A | Discharge status: Home / Self Care | Source: Ambulatory Visit

## 2023-12-31 DIAGNOSIS — C9110 Chronic lymphocytic leukemia of B-cell type not having achieved remission: Secondary | ICD-10-CM | POA: Insufficient documentation

## 2023-12-31 LAB — GLUCOSE, CAPILLARY: Glucose-Capillary: 139 mg/dL — ABNORMAL HIGH (ref 70–99)

## 2023-12-31 MED ORDER — FLUDEOXYGLUCOSE F - 18 (FDG) INJECTION
7.5700 | Freq: Once | INTRAVENOUS | Status: AC | PRN
  Administered 2023-12-31: 7.57 via INTRAVENOUS

## 2024-01-04 ENCOUNTER — Other Ambulatory Visit: Payer: Self-pay

## 2024-01-04 ENCOUNTER — Ambulatory Visit: Payer: Self-pay

## 2024-01-04 DIAGNOSIS — R3911 Hesitancy of micturition: Secondary | ICD-10-CM

## 2024-01-04 DIAGNOSIS — C9110 Chronic lymphocytic leukemia of B-cell type not having achieved remission: Secondary | ICD-10-CM

## 2024-01-04 NOTE — Telephone Encounter (Signed)
 Per Dr. Alita Irwin, called pt to make aware of negative PET scan. Left message on pt vm to call office if there are any questions.

## 2024-02-27 ENCOUNTER — Ambulatory Visit: Payer: Medicare Other

## 2024-02-27 VITALS — BP 151/68 | HR 74 | Temp 97.9°F | Resp 20 | Ht 62.0 in | Wt 155.6 lb

## 2024-02-27 DIAGNOSIS — M810 Age-related osteoporosis without current pathological fracture: Secondary | ICD-10-CM | POA: Diagnosis not present

## 2024-02-27 MED ORDER — DENOSUMAB 60 MG/ML ~~LOC~~ SOSY
60.0000 mg | PREFILLED_SYRINGE | Freq: Once | SUBCUTANEOUS | Status: AC
  Administered 2024-02-27: 60 mg via SUBCUTANEOUS
  Filled 2024-02-27: qty 1

## 2024-02-27 NOTE — Progress Notes (Signed)
 Diagnosis: Osteoporosis  Provider:  Chilton Greathouse MD  Procedure: Injection  Prolia (Denosumab), Dose: 60 mg, Site: subcutaneous, Number of injections: 1  Injection Site(s): Left arm  Post Care: Patient declined observation  Discharge: Condition: Good, Destination: Home . AVS Provided  Performed by:  Wyvonne Lenz, RN

## 2024-03-13 NOTE — Progress Notes (Signed)
 [facility] OFFICE PROGRESS NOTE  Patient Care Team: Yolande Toribio MATSU, MD as PCP - General (Internal Medicine)  89 y.o. male with history of HTN, DM2, vit D deficiency, osteoporosis being seen for lymphocytosis.    Testing confired CLL with del13q and loss of TP53.   Clinically stable, no signs of progression.  Recommend continue follow-up surveillance.  Will order labs to include CBC, CMP and LDH for next visit. Assessment & Plan CLL (chronic lymphocytic leukemia) (HCC) PET scan reported normal Stable Repeat lab in 3 months. Thrombocytopenia (HCC) Stable, will monitor every 3 months with CLL Anemia of chronic disease Stable.  Adequate level ferritin, B12 and folate. Continue to monitor with CLL.  Orders Placed This Encounter  Procedures   CBC with Differential (Cancer Center Only)    Standing Status:   Future    Expiration Date:   03/15/2025   CMP (Cancer Center only)    Standing Status:   Future    Expiration Date:   03/15/2025   Lactate dehydrogenase    Standing Status:   Future    Expiration Date:   03/15/2025     Pauletta JAYSON Chihuahua, MD  INTERVAL HISTORY: Patient returns for follow-up.  Overall clinically without any new symptoms.  No lymphadenopathy.  No other new symptoms.  Oncology History  CLL (chronic lymphocytic leukemia) (HCC)  11/22/2023 Initial Diagnosis   CLL (chronic lymphocytic leukemia) (HCC) WBC 11.2 Hgb 11.6. MCV 95. Plt 145. ALC 7.2   Peripheral blood, flow cytometry:  -  Monoclonal B-cell lymphocytosis (high count) with a CLL immunophenotype  FISH:  positive for loss of both 13q14 signals and loss of one TP53 signal, consistent with a deletion of 13q14 region and a deletion of TP53 gene.    12/13/2023 Cancer Staging   Staging form: Chronic Lymphocytic Leukemia / Small Lymphocytic Lymphoma, AJCC 8th Edition - Clinical: Modified Rai Stage 0 (Modified Rai risk: Low, Lymphocytosis: Present, Adenopathy: Absent, Organomegaly: Absent, Anemia:  Absent, Thrombocytopenia: Absent) - Signed by Chihu[place]ua Pauletta JAYSON, MD on 03/15/2024 Stage prefix: Initial diagnosis      PHYSICAL EXAMINATION: ECOG PERFORMANCE STATUS: 2 - Symptomatic, <50% confined to bed  Vitals:   03/14/24 1451  BP: (!) 140/81  Pulse: 96  Resp: 17  Temp: (!) 97.4 F (36.3 C)  SpO2: 99%   Filed Weights   03/14/24 1451  Weight: 157 lb 12.8 oz (71.6 kg)    GENERAL: alert, no distress and comfortable, moving slowly SKIN: skin color normal and no jaundice  EYES: sclera clear NECK: No palpable mass LYMPH:  no palpable cervical, axillary lymphadenopathy  LUNGS: clear to auscultation and percussion with normal breathing effort HEART: regular rate & rhythm  ABDOMEN: abdomen soft, non-tender and nondistended. Musculoskeletal: no edema   Relevant data reviewed during this visit included labs.

## 2024-03-14 ENCOUNTER — Inpatient Hospital Stay (HOSPITAL_BASED_OUTPATIENT_CLINIC_OR_DEPARTMENT_OTHER)

## 2024-03-14 ENCOUNTER — Inpatient Hospital Stay

## 2024-03-14 VITALS — BP 140/81 | HR 96 | Temp 97.4°F | Resp 17 | Wt 157.8 lb

## 2024-03-14 DIAGNOSIS — D638 Anemia in other chronic diseases classified elsewhere: Secondary | ICD-10-CM | POA: Insufficient documentation

## 2024-03-14 DIAGNOSIS — C9110 Chronic lymphocytic leukemia of B-cell type not having achieved remission: Secondary | ICD-10-CM | POA: Diagnosis not present

## 2024-03-14 DIAGNOSIS — D696 Thrombocytopenia, unspecified: Secondary | ICD-10-CM | POA: Diagnosis not present

## 2024-03-14 DIAGNOSIS — N401 Enlarged prostate with lower urinary tract symptoms: Secondary | ICD-10-CM

## 2024-03-14 DIAGNOSIS — Z79899 Other long term (current) drug therapy: Secondary | ICD-10-CM | POA: Diagnosis not present

## 2024-03-14 LAB — CBC WITH DIFFERENTIAL (CANCER CENTER ONLY)
Abs Immature Granulocytes: 0.01 K/uL (ref 0.00–0.07)
Basophils Absolute: 0.1 K/uL (ref 0.0–0.1)
Basophils Relative: 1 %
Eosinophils Absolute: 0.1 K/uL (ref 0.0–0.5)
Eosinophils Relative: 1 %
HCT: 34.1 % — ABNORMAL LOW (ref 39.0–52.0)
Hemoglobin: 11.7 g/dL — ABNORMAL LOW (ref 13.0–17.0)
Immature Granulocytes: 0 %
Lymphocytes Relative: 62 %
Lymphs Abs: 6.7 K/uL — ABNORMAL HIGH (ref 0.7–4.0)
MCH: 32.5 pg (ref 26.0–34.0)
MCHC: 34.3 g/dL (ref 30.0–36.0)
MCV: 94.7 fL (ref 80.0–100.0)
Monocytes Absolute: 1 K/uL (ref 0.1–1.0)
Monocytes Relative: 9 %
Neutro Abs: 2.9 K/uL (ref 1.7–7.7)
Neutrophils Relative %: 27 %
Platelet Count: 148 K/uL — ABNORMAL LOW (ref 150–400)
RBC: 3.6 MIL/uL — ABNORMAL LOW (ref 4.22–5.81)
RDW: 12.8 % (ref 11.5–15.5)
Smear Review: NORMAL
WBC Count: 10.8 K/uL — ABNORMAL HIGH (ref 4.0–10.5)
nRBC: 0 % (ref 0.0–0.2)

## 2024-03-14 LAB — CMP (CANCER CENTER ONLY)
ALT: 16 U/L (ref 0–44)
AST: 21 U/L (ref 15–41)
Albumin: 4.5 g/dL (ref 3.5–5.0)
Alkaline Phosphatase: 49 U/L (ref 38–126)
Anion gap: 8 (ref 5–15)
BUN: 21 mg/dL (ref 8–23)
CO2: 25 mmol/L (ref 22–32)
Calcium: 9 mg/dL (ref 8.9–10.3)
Chloride: 104 mmol/L (ref 98–111)
Creatinine: 1.22 mg/dL (ref 0.61–1.24)
GFR, Estimated: 57 mL/min — ABNORMAL LOW (ref >60)
Glucose, Bld: 201 mg/dL — ABNORMAL HIGH (ref 70–99)
Potassium: 4.4 mmol/L (ref 3.5–5.1)
Sodium: 137 mmol/L (ref 135–145)
Total Bilirubin: 0.5 mg/dL (ref 0.0–1.2)
Total Protein: 7.3 g/dL (ref 6.5–8.1)

## 2024-03-14 LAB — LACTATE DEHYDROGENASE: LDH: 187 U/L (ref 98–192)

## 2024-03-15 ENCOUNTER — Encounter: Payer: Self-pay | Admitting: Pulmonary Disease

## 2024-03-15 DIAGNOSIS — D638 Anemia in other chronic diseases classified elsewhere: Secondary | ICD-10-CM | POA: Insufficient documentation

## 2024-03-15 DIAGNOSIS — D696 Thrombocytopenia, unspecified: Secondary | ICD-10-CM | POA: Insufficient documentation

## 2024-03-15 LAB — PROSTATE-SPECIFIC AG, SERUM (LABCORP): Prostate Specific Ag, Serum: 0.5 ng/mL (ref 0.0–4.0)

## 2024-03-15 NOTE — Assessment & Plan Note (Addendum)
 Stable, will monitor every 3 months with CLL

## 2024-03-15 NOTE — Assessment & Plan Note (Addendum)
 Stable.  Adequate level ferritin, B12 and folate. Continue to monitor with CLL.

## 2024-03-15 NOTE — Assessment & Plan Note (Addendum)
 PET scan reported normal Stable Repeat lab in 3 months.

## 2024-06-12 NOTE — Progress Notes (Signed)
 [facility] OFFICE PROGRESS NOTE  Patient Care Team: Yolande Toribio MATSU, MD as PCP - General (Internal Medicine)  89 y.o. male with history of HTN, DM2, vit D deficiency, osteoporosis being seen for lymphocytosis.    Testing confired CLL with del13q and loss of TP53.    Clinically stable, no signs of progression.  Recommend continue follow-up surveillance.  Will order labs to include CBC, CMP and LDH for next visit.  Assessment & Plan CLL (chronic lymphocytic leukemia) (HCC) PET scan reported normal Stable Repeat lab in 3 months. CKD stage 3a, GFR 45-59 ml/min (HCC) Discussed increase fluid intake Thrombocytopenia Stable, will monitor every 3 months with CLL  Orders Placed This Encounter  Procedures   CBC with Differential (Cancer Center Only)    Standing Status:   Future    Expiration Date:   06/13/2025   CMP (Cancer Center only)    Standing Status:   Future    Expiration Date:   06/13/2025   Lactate dehydrogenase    Standing Status:   Future    Expiration Date:   06/13/2025     Pauletta JAYSON Chihuahua, MD  INTERVAL HISTORY: Patient returns for follow-up. Appetite is the same. Moving slower. Drinking about 32 oz a day. No night sweats, weight loss, mass swelling, stomach pain.   Oncology History  CLL (chronic lymphocytic leukemia) (HCC)  11/22/2023 Initial Diagnosis   CLL (chronic lymphocytic leukemia) (HCC) WBC 11.2 Hgb 11.6. MCV 95. Plt 145. ALC 7.2   Peripheral blood, flow cytometry:  -  Monoclonal B-cell lymphocytosis (high count) with a CLL immunophenotype  FISH:  positive for loss of both 13q14 signals and loss of one TP53 signal, consistent with a deletion of 13q14 region and a deletion of TP53 gene.    12/13/2023 Cancer Staging   Staging form: Chronic Lymphocytic Leukemia / Small Lymphocytic Lymphoma, AJCC 8th Edition - Clinical: Modified Rai Stage 0 (Modified Rai risk: Low, Lymphocytosis: Present, Adenopathy: Absent, Organomegaly: Absent, Anemia:  Absent, Thrombocytopenia: Absent) - Signed by [place] Pauletta JAYSON, MD on 03/15/2024 Stage prefix: Initial diagnosis    Past Medical History:  Diagnosis Date   Arthritis    Barrett's esophagus    Compression fracture of spine (HCC)    T 10   Diabetes (HCC)    Elevated cholesterol    Gastric ulcer 1990   Hemorrhoids    History of colon polyps    Hypertension    Tubular adenoma of colon      PHYSICAL EXAMINATION: ECOG PERFORMANCE STATUS: 3  Vitals:   06/13/24 1453 06/13/24 1500  BP: (!) 143/52 (!) 129/50  Pulse: 89 86  Resp: 18   Temp: (!) 97.3 F (36.3 C)   SpO2: 99%    Filed Weights   06/13/24 1453  Weight: 156 lb 8 oz (71 kg)    GENERAL: alert, no distress and comfortable SKIN: skin color normal and no jaundice or bruising or petechiae on exposed skin EYES: normal, sclera clear OROPHARYNX: no exudate  NECK: Small submental lymph node LYMPH: Small submental lymph nodes.  No palpable axillary lymphadenopathy  LUNGS: clear to auscultation and no wheeze or rales with normal breathing effort HEART: regular rate & rhythm  ABDOMEN: abdomen soft, non-tender and nondistended.   Relevant data reviewed during this visit included labs.  New labs ordered.

## 2024-06-13 ENCOUNTER — Inpatient Hospital Stay

## 2024-06-13 VITALS — BP 129/50 | HR 86 | Temp 97.3°F | Resp 18 | Wt 156.5 lb

## 2024-06-13 DIAGNOSIS — Z23 Encounter for immunization: Secondary | ICD-10-CM | POA: Diagnosis not present

## 2024-06-13 DIAGNOSIS — C9110 Chronic lymphocytic leukemia of B-cell type not having achieved remission: Secondary | ICD-10-CM

## 2024-06-13 DIAGNOSIS — N1831 Chronic kidney disease, stage 3a: Secondary | ICD-10-CM | POA: Insufficient documentation

## 2024-06-13 DIAGNOSIS — Z8711 Personal history of peptic ulcer disease: Secondary | ICD-10-CM | POA: Insufficient documentation

## 2024-06-13 DIAGNOSIS — Z860101 Personal history of adenomatous and serrated colon polyps: Secondary | ICD-10-CM | POA: Diagnosis not present

## 2024-06-13 DIAGNOSIS — Z79899 Other long term (current) drug therapy: Secondary | ICD-10-CM | POA: Insufficient documentation

## 2024-06-13 DIAGNOSIS — D696 Thrombocytopenia, unspecified: Secondary | ICD-10-CM

## 2024-06-13 DIAGNOSIS — Z8719 Personal history of other diseases of the digestive system: Secondary | ICD-10-CM | POA: Diagnosis not present

## 2024-06-13 DIAGNOSIS — M1990 Unspecified osteoarthritis, unspecified site: Secondary | ICD-10-CM | POA: Insufficient documentation

## 2024-06-13 LAB — CBC WITH DIFFERENTIAL (CANCER CENTER ONLY)
Abs Immature Granulocytes: 0.02 K/uL (ref 0.00–0.07)
Basophils Absolute: 0.1 K/uL (ref 0.0–0.1)
Basophils Relative: 0 %
Eosinophils Absolute: 0.1 K/uL (ref 0.0–0.5)
Eosinophils Relative: 1 %
HCT: 33.8 % — ABNORMAL LOW (ref 39.0–52.0)
Hemoglobin: 11.4 g/dL — ABNORMAL LOW (ref 13.0–17.0)
Immature Granulocytes: 0 %
Lymphocytes Relative: 61 %
Lymphs Abs: 7.6 K/uL — ABNORMAL HIGH (ref 0.7–4.0)
MCH: 32.8 pg (ref 26.0–34.0)
MCHC: 33.7 g/dL (ref 30.0–36.0)
MCV: 97.1 fL (ref 80.0–100.0)
Monocytes Absolute: 1.2 K/uL — ABNORMAL HIGH (ref 0.1–1.0)
Monocytes Relative: 10 %
Neutro Abs: 3.5 K/uL (ref 1.7–7.7)
Neutrophils Relative %: 28 %
Platelet Count: 139 K/uL — ABNORMAL LOW (ref 150–400)
RBC: 3.48 MIL/uL — ABNORMAL LOW (ref 4.22–5.81)
RDW: 13 % (ref 11.5–15.5)
Smear Review: NORMAL
WBC Count: 12.5 K/uL — ABNORMAL HIGH (ref 4.0–10.5)
nRBC: 0 % (ref 0.0–0.2)

## 2024-06-13 LAB — CMP (CANCER CENTER ONLY)
ALT: 18 U/L (ref 0–44)
AST: 29 U/L (ref 15–41)
Albumin: 4.7 g/dL (ref 3.5–5.0)
Alkaline Phosphatase: 46 U/L (ref 38–126)
Anion gap: 9 (ref 5–15)
BUN: 26 mg/dL — ABNORMAL HIGH (ref 8–23)
CO2: 26 mmol/L (ref 22–32)
Calcium: 9.6 mg/dL (ref 8.9–10.3)
Chloride: 102 mmol/L (ref 98–111)
Creatinine: 1.41 mg/dL — ABNORMAL HIGH (ref 0.61–1.24)
GFR, Estimated: 48 mL/min — ABNORMAL LOW (ref >60)
Glucose, Bld: 167 mg/dL — ABNORMAL HIGH (ref 70–99)
Potassium: 4.6 mmol/L (ref 3.5–5.1)
Sodium: 137 mmol/L (ref 135–145)
Total Bilirubin: 0.5 mg/dL (ref 0.0–1.2)
Total Protein: 7.9 g/dL (ref 6.5–8.1)

## 2024-06-13 LAB — LACTATE DEHYDROGENASE: LDH: 238 U/L — ABNORMAL HIGH (ref 105–235)

## 2024-06-13 MED ORDER — INFLUENZA VAC SPLIT HIGH-DOSE 0.5 ML IM SUSY
0.5000 mL | PREFILLED_SYRINGE | Freq: Once | INTRAMUSCULAR | Status: AC
  Administered 2024-06-13: 0.5 mL via INTRAMUSCULAR
  Filled 2024-06-13: qty 0.5

## 2024-06-13 NOTE — Assessment & Plan Note (Addendum)
 Stable, will monitor every 3 months with CLL

## 2024-06-13 NOTE — Assessment & Plan Note (Addendum)
 Discussed increase fluid intake

## 2024-06-13 NOTE — Assessment & Plan Note (Addendum)
 PET scan reported normal Stable Repeat lab in 3 months.

## 2024-08-06 ENCOUNTER — Telehealth: Payer: Self-pay

## 2024-08-06 NOTE — Telephone Encounter (Signed)
 Auth Submission: NO AUTH NEEDED Site of care: Site of care: [facility] Payer: Medicare A/B with A[employer] supplement Medication & CPT/J Code(s) submitted: Prolia  (Denosumab ) [id number] Diagnosis Code:  Route of submission (phone, fax, portal):  Phone # Fax # Auth type: Buy/Bill PB Units/visits requested: 60mg  x 2 doses Reference number:  Approval from: 08/06/24 to 08/23/25   Patient does not want to switch to [facility] because of how they do their scheduling. Couldn't elaborate more on that other than they will not schedule the next one.

## 2024-09-01 ENCOUNTER — Ambulatory Visit

## 2024-09-04 ENCOUNTER — Ambulatory Visit

## 2024-09-12 ENCOUNTER — Inpatient Hospital Stay
